# Patient Record
Sex: Female | Born: 1935
Health system: Southern US, Community
[De-identification: ages and names within clinical notes are randomized; demographics above are authoritative.]

## PROBLEM LIST (undated history)

## (undated) DIAGNOSIS — F32A Depression, unspecified: Secondary | ICD-10-CM

## (undated) DIAGNOSIS — D649 Anemia, unspecified: Secondary | ICD-10-CM

## (undated) DIAGNOSIS — M1990 Unspecified osteoarthritis, unspecified site: Secondary | ICD-10-CM

## (undated) DIAGNOSIS — R112 Nausea with vomiting, unspecified: Secondary | ICD-10-CM

## (undated) DIAGNOSIS — C180 Malignant neoplasm of cecum: Secondary | ICD-10-CM

## (undated) DIAGNOSIS — T7840XA Allergy, unspecified, initial encounter: Secondary | ICD-10-CM

## (undated) DIAGNOSIS — I10 Essential (primary) hypertension: Secondary | ICD-10-CM

## (undated) DIAGNOSIS — J309 Allergic rhinitis, unspecified: Secondary | ICD-10-CM

## (undated) DIAGNOSIS — Z9889 Other specified postprocedural states: Secondary | ICD-10-CM

## (undated) DIAGNOSIS — Z85828 Personal history of other malignant neoplasm of skin: Secondary | ICD-10-CM

## (undated) DIAGNOSIS — F419 Anxiety disorder, unspecified: Secondary | ICD-10-CM

## (undated) DIAGNOSIS — M810 Age-related osteoporosis without current pathological fracture: Secondary | ICD-10-CM

## (undated) DIAGNOSIS — R7302 Impaired glucose tolerance (oral): Secondary | ICD-10-CM

## (undated) DIAGNOSIS — F329 Major depressive disorder, single episode, unspecified: Secondary | ICD-10-CM

## (undated) HISTORY — DX: Anxiety disorder, unspecified: F41.9

## (undated) HISTORY — PX: ABDOMINAL HYSTERECTOMY: SHX81

## (undated) HISTORY — PX: APPENDECTOMY: SHX54

## (undated) HISTORY — DX: Anemia, unspecified: D64.9

## (undated) HISTORY — DX: Major depressive disorder, single episode, unspecified: F32.9

## (undated) HISTORY — DX: Unspecified osteoarthritis, unspecified site: M19.90

## (undated) HISTORY — DX: Depression, unspecified: F32.A

## (undated) HISTORY — DX: Impaired glucose tolerance (oral): R73.02

## (undated) HISTORY — DX: Essential (primary) hypertension: I10

## (undated) HISTORY — DX: Allergic rhinitis, unspecified: J30.9

## (undated) HISTORY — DX: Allergy, unspecified, initial encounter: T78.40XA

## (undated) HISTORY — PX: CHOLECYSTECTOMY: SHX55

## (undated) HISTORY — PX: TONSILLECTOMY: SUR1361

---

## 1999-05-29 ENCOUNTER — Other Ambulatory Visit: Admission: RE | Admit: 1999-05-29 | Discharge: 1999-05-29 | Payer: Self-pay | Admitting: Obstetrics and Gynecology

## 2000-05-29 ENCOUNTER — Other Ambulatory Visit: Admission: RE | Admit: 2000-05-29 | Discharge: 2000-05-29 | Payer: Self-pay | Admitting: Obstetrics and Gynecology

## 2001-07-28 ENCOUNTER — Other Ambulatory Visit: Admission: RE | Admit: 2001-07-28 | Discharge: 2001-07-28 | Payer: Self-pay | Admitting: Obstetrics and Gynecology

## 2002-05-26 ENCOUNTER — Emergency Department (HOSPITAL_COMMUNITY): Admission: EM | Admit: 2002-05-26 | Discharge: 2002-05-26 | Payer: Self-pay

## 2002-08-02 ENCOUNTER — Other Ambulatory Visit: Admission: RE | Admit: 2002-08-02 | Discharge: 2002-08-02 | Payer: Self-pay | Admitting: Obstetrics and Gynecology

## 2004-02-01 ENCOUNTER — Other Ambulatory Visit: Admission: RE | Admit: 2004-02-01 | Discharge: 2004-02-01 | Payer: Self-pay | Admitting: Obstetrics and Gynecology

## 2008-12-09 HISTORY — PX: BACK SURGERY: SHX140

## 2009-11-27 ENCOUNTER — Encounter (INDEPENDENT_AMBULATORY_CARE_PROVIDER_SITE_OTHER): Payer: Self-pay | Admitting: Orthopaedic Surgery

## 2009-11-27 ENCOUNTER — Ambulatory Visit (HOSPITAL_COMMUNITY): Admission: RE | Admit: 2009-11-27 | Discharge: 2009-11-28 | Payer: Self-pay | Admitting: Orthopaedic Surgery

## 2011-03-11 LAB — URINALYSIS, ROUTINE W REFLEX MICROSCOPIC
Bilirubin Urine: NEGATIVE
Glucose, UA: NEGATIVE mg/dL
Ketones, ur: NEGATIVE mg/dL
Nitrite: NEGATIVE
Protein, ur: NEGATIVE mg/dL
Specific Gravity, Urine: 1.02 (ref 1.005–1.030)
Urobilinogen, UA: 0.2 mg/dL (ref 0.0–1.0)
pH: 5.5 (ref 5.0–8.0)

## 2011-03-11 LAB — COMPREHENSIVE METABOLIC PANEL
ALT: 21 U/L (ref 0–35)
AST: 22 U/L (ref 0–37)
Albumin: 4 g/dL (ref 3.5–5.2)
Alkaline Phosphatase: 84 U/L (ref 39–117)
BUN: 17 mg/dL (ref 6–23)
CO2: 26 mEq/L (ref 19–32)
Calcium: 9.4 mg/dL (ref 8.4–10.5)
Chloride: 104 mEq/L (ref 96–112)
Creatinine, Ser: 0.6 mg/dL (ref 0.4–1.2)
GFR calc Af Amer: >60 mL/min (ref >60)
GFR calc non Af Amer: >60 mL/min (ref >60)
Glucose, Bld: 121 mg/dL — ABNORMAL HIGH (ref 70–99)
Potassium: 3.5 mEq/L (ref 3.5–5.1)
Sodium: 139 mEq/L (ref 135–145)
Total Bilirubin: 0.8 mg/dL (ref 0.3–1.2)
Total Protein: 7.5 g/dL (ref 6.0–8.3)

## 2011-03-11 LAB — URINE MICROSCOPIC-ADD ON

## 2011-03-11 LAB — CBC
HCT: 39.3 % (ref 36.0–46.0)
Hemoglobin: 13.5 g/dL (ref 12.0–15.0)
MCHC: 34.4 g/dL (ref 30.0–36.0)
MCV: 89.3 fL (ref 78.0–100.0)
Platelets: 188 10*3/uL (ref 150–400)
RBC: 4.4 MIL/uL (ref 3.87–5.11)
RDW: 13.6 % (ref 11.5–15.5)
WBC: 6.6 10*3/uL (ref 4.0–10.5)

## 2012-01-26 ENCOUNTER — Encounter: Payer: Self-pay | Admitting: Internal Medicine

## 2012-01-26 DIAGNOSIS — Z0001 Encounter for general adult medical examination with abnormal findings: Secondary | ICD-10-CM | POA: Insufficient documentation

## 2012-01-26 DIAGNOSIS — R7302 Impaired glucose tolerance (oral): Secondary | ICD-10-CM | POA: Insufficient documentation

## 2012-01-26 DIAGNOSIS — M519 Unspecified thoracic, thoracolumbar and lumbosacral intervertebral disc disorder: Secondary | ICD-10-CM | POA: Insufficient documentation

## 2012-01-26 DIAGNOSIS — Z0000 Encounter for general adult medical examination without abnormal findings: Secondary | ICD-10-CM | POA: Insufficient documentation

## 2012-01-28 ENCOUNTER — Encounter: Payer: Self-pay | Admitting: Internal Medicine

## 2012-01-28 ENCOUNTER — Other Ambulatory Visit (INDEPENDENT_AMBULATORY_CARE_PROVIDER_SITE_OTHER): Payer: Medicare Other

## 2012-01-28 ENCOUNTER — Ambulatory Visit (INDEPENDENT_AMBULATORY_CARE_PROVIDER_SITE_OTHER): Payer: Medicare Other | Admitting: Internal Medicine

## 2012-01-28 ENCOUNTER — Ambulatory Visit (INDEPENDENT_AMBULATORY_CARE_PROVIDER_SITE_OTHER)
Admission: RE | Admit: 2012-01-28 | Discharge: 2012-01-28 | Disposition: A | Discharge status: Home / Self Care | Payer: Medicare Other | Source: Ambulatory Visit | Attending: Internal Medicine | Admitting: Internal Medicine

## 2012-01-28 VITALS — BP 162/82 | HR 79 | Temp 97.1°F | Ht 64.0 in | Wt 133.4 lb

## 2012-01-28 DIAGNOSIS — I10 Essential (primary) hypertension: Secondary | ICD-10-CM

## 2012-01-28 DIAGNOSIS — F411 Generalized anxiety disorder: Secondary | ICD-10-CM

## 2012-01-28 DIAGNOSIS — M25512 Pain in left shoulder: Secondary | ICD-10-CM

## 2012-01-28 DIAGNOSIS — M25519 Pain in unspecified shoulder: Secondary | ICD-10-CM

## 2012-01-28 DIAGNOSIS — F329 Major depressive disorder, single episode, unspecified: Secondary | ICD-10-CM | POA: Insufficient documentation

## 2012-01-28 DIAGNOSIS — Z23 Encounter for immunization: Secondary | ICD-10-CM

## 2012-01-28 DIAGNOSIS — M1990 Unspecified osteoarthritis, unspecified site: Secondary | ICD-10-CM

## 2012-01-28 DIAGNOSIS — Z79899 Other long term (current) drug therapy: Secondary | ICD-10-CM

## 2012-01-28 DIAGNOSIS — Z0000 Encounter for general adult medical examination without abnormal findings: Secondary | ICD-10-CM

## 2012-01-28 DIAGNOSIS — F419 Anxiety disorder, unspecified: Secondary | ICD-10-CM

## 2012-01-28 DIAGNOSIS — J309 Allergic rhinitis, unspecified: Secondary | ICD-10-CM

## 2012-01-28 HISTORY — DX: Anxiety disorder, unspecified: F41.9

## 2012-01-28 HISTORY — DX: Unspecified osteoarthritis, unspecified site: M19.90

## 2012-01-28 HISTORY — DX: Allergic rhinitis, unspecified: J30.9

## 2012-01-28 LAB — TSH: TSH: 1.37 u[IU]/mL (ref 0.35–5.50)

## 2012-01-28 LAB — CBC WITH DIFFERENTIAL/PLATELET
Basophils Absolute: 0.1 10*3/uL (ref 0.0–0.1)
Eosinophils Absolute: 0.2 10*3/uL (ref 0.0–0.7)
Lymphocytes Relative: 33.2 % (ref 12.0–46.0)
MCHC: 33.5 g/dL (ref 30.0–36.0)
MCV: 89 fl (ref 78.0–100.0)
Monocytes Absolute: 0.3 10*3/uL (ref 0.1–1.0)
Neutrophils Relative %: 57.6 % (ref 43.0–77.0)
Platelets: 217 10*3/uL (ref 150.0–400.0)
RBC: 4.59 Mil/uL (ref 3.87–5.11)

## 2012-01-28 LAB — LIPID PANEL
Cholesterol: 252 mg/dL — ABNORMAL HIGH (ref 0–200)
Total CHOL/HDL Ratio: 4
VLDL: 22.2 mg/dL (ref 0.0–40.0)

## 2012-01-28 LAB — HEPATIC FUNCTION PANEL
ALT: 18 U/L (ref 0–35)
AST: 21 U/L (ref 0–37)
Bilirubin, Direct: 0 mg/dL (ref 0.0–0.3)
Total Bilirubin: 0.4 mg/dL (ref 0.3–1.2)
Total Protein: 7.4 g/dL (ref 6.0–8.3)

## 2012-01-28 LAB — URINALYSIS, ROUTINE W REFLEX MICROSCOPIC
Bilirubin Urine: NEGATIVE
Nitrite: NEGATIVE
Urobilinogen, UA: 0.2 (ref 0.0–1.0)

## 2012-01-28 LAB — BASIC METABOLIC PANEL
BUN: 15 mg/dL (ref 6–23)
CO2: 29 mEq/L (ref 19–32)
Chloride: 101 mEq/L (ref 96–112)
Creatinine, Ser: 0.6 mg/dL (ref 0.4–1.2)
Potassium: 4.5 mEq/L (ref 3.5–5.1)

## 2012-01-28 LAB — LDL CHOLESTEROL, DIRECT: Direct LDL: 166.8 mg/dL

## 2012-01-28 MED ORDER — LOSARTAN POTASSIUM 100 MG PO TABS: 100.0000 mg | ORAL_TABLET | Freq: Every day | ORAL | Status: DC

## 2012-01-28 MED ORDER — DIAZEPAM 5 MG PO TABS: 5.0000 mg | ORAL_TABLET | Freq: Two times a day (BID) | ORAL | Status: AC | PRN

## 2012-01-28 NOTE — Assessment & Plan Note (Signed)
Overall doing well, age appropriate education and counseling updated, referrals for preventative services and immunizations addressed, dietary and smoking counseling addressed, most recent labs and ECG reviewed.  I have personally reviewed and have noted: 1) the patient's medical and social history 2) The pt's use of alcohol, tobacco, and illicit drugs 3) The patient's current medications and supplements 4) Functional ability including ADL's, fall risk, home safety risk, hearing and visual impairment 5) Diet and physical activities 6) Evidence for depression or mood disorder 7) The patient's height, weight, and BMI have been recorded in the chart I have made referrals, and provided counseling and education based on review of the above Declines colonscopy adn immunizations except ok for pneumonvax today, ECG reviewed as per emr

## 2012-01-28 NOTE — Assessment & Plan Note (Signed)
Severe uncontrolled, to change to losartan 100 mg per day; ECG reviewed as per emr, to f/u 1 month

## 2012-01-28 NOTE — Progress Notes (Signed)
Subjective:    Patient ID: Carrie Page, female    DOB: 1928/09/14, 78 y.o.   MRN: [id number]  HPI  Here for wellness and to establish as new pt;  Overall doing ok;  Pt denies CP, worsening SOB, DOE, wheezing, orthopnea, PND, worsening LE edema, palpitations, dizziness or syncope.  Pt denies neurological change such as new Headache, facial or extremity weakness.  Pt denies polydipsia, polyuria, or low sugar symptoms. Pt states overall good compliance with treatment and medications, good tolerability, and trying to follow lower cholesterol diet.  Pt denies worsening depressive symptoms, suicidal ideation or panic. No fever, wt loss, night sweats, loss of appetite, or other constitutional symptoms.  Pt states good ability with ADL's, low fall risk, home safety reviewed and adequate, no significant changes in hearing or vision, and occasionally active with exercise.  Has had some worsening anxiety and depressive symtpoms with mother, sister, and father deaths relatviely recent. Asks for for valium, to use only infrequently as she has had in the past.  Also with c/o left shoulder pain for several months, has FROM, no swelling, rash or recent trauma or falls, but aches to use in general. Past Medical History  Diagnosis Date  . Impaired glucose tolerance   . Lumbar disc disease 01/26/2012  . Arthritis   . Allergy   . Allergic rhinitis, cause unspecified 01/28/2012  . Degenerative joint disease 01/28/2012  . Hypertension   . Depression   . Anxiety 01/28/2012   Past Surgical History  Procedure Date  . Lumbar foraminotomy and microdiskectomy dec 2010    Dr Carolyne Littles  . Abdominal hysterectomy   . Appendectomy   . Gallbladder surgery   . Tonsillectomy   . Back surgery 2010    reports that she has never smoked. She does not have any smokeless tobacco history on file. She reports that she does not drink alcohol or use illicit drugs. family history includes Arthritis in her other; Heart disease in  her other; and Stroke in her other. No Known Allergies No current outpatient prescriptions on file prior to visit.   Review of Systems Review of Systems  Constitutional: Negative for diaphoresis, activity change, appetite change and unexpected weight change.  HENT: Negative for hearing loss, ear pain, facial swelling, mouth sores and neck stiffness.   Eyes: Negative for pain, redness and visual disturbance.  Respiratory: Negative for shortness of breath and wheezing.   Cardiovascular: Negative for chest pain and palpitations.  Gastrointestinal: Negative for diarrhea, blood in stool, abdominal distention and rectal pain.  Genitourinary: Negative for hematuria, flank pain and decreased urine volume.  Musculoskeletal: Negative for myalgias and joint swelling.  Skin: Negative for color change and wound.  Neurological: Negative for syncope and numbness.  Hematological: Negative for adenopathy.  Psychiatric/Behavioral: Negative for hallucinations, self-injury, decreased concentration and agitation.  \    Objective:   Physical Exam BP 162/82  Pulse 79  Temp(Src) 97.1 F (36.2 C) (Oral)  Ht 5\' 4"  (1.626 m)  Wt 133 lb 6 oz (60.499 kg)  BMI 22.89 kg/m2  SpO2 97% Physical Exam  VS noted Constitutional: Pt is oriented to person, place, and time. Appears well-developed and well-nourished.  HENT:  Head: Normocephalic and atraumatic.  Right Ear: External ear normal.  Left Ear: External ear normal.  Nose: Nose normal.  Mouth/Throat: Oropharynx is clear and moist.  Eyes: Conjunctivae and EOM are normal. Pupils are equal, round, and reactive to light.  Neck: Normal range of motion. Neck supple. No JVD  present. No tracheal deviation present.  Cardiovascular: Normal rate, regular rhythm, normal heart sounds and intact distal pulses.   Pulmonary/Chest: Effort normal and breath sounds normal.  Abdominal: Soft. Bowel sounds are normal. There is no tenderness.  Musculoskeletal: Normal range of  motion. Exhibits no edema.  Lymphadenopathy:  Has no cervical adenopathy.  Neurological: Pt is alert and oriented to person, place, and time. Pt has normal reflexes. No cranial nerve deficit.  Skin: Skin is warm and dry. No rash noted.  Psychiatric:  Has  normal mood and affect. Behavior is normal. 1+ nervous Left shoulder NT, FROM    Assessment & Plan:

## 2012-01-28 NOTE — Patient Instructions (Addendum)
OK to stop the lisinopril 5 mg per day Start the losartan (cozaar) at 100 mg per day, as well as the valium as needed You had the pneumonia shot today Your EKG was ok today Please go to XRAY in the Basement for the x-ray test Please go to LAB in the Basement for the blood and/or urine tests to be done today Please call the phone number [phone] (the [employer] System) for results of testing in 2-3 days;  When calling, simply dial the number, and when prompted enter the MRN number above (the Medical Record Number) and the # key, then the message should start. Please let me know if you would like to see orthopedic about your shoulders Please return in 1 month, since you are starting new medication today

## 2012-01-29 ENCOUNTER — Other Ambulatory Visit: Payer: Self-pay | Admitting: Internal Medicine

## 2012-01-29 DIAGNOSIS — E785 Hyperlipidemia, unspecified: Secondary | ICD-10-CM

## 2012-01-29 DIAGNOSIS — Z79899 Other long term (current) drug therapy: Secondary | ICD-10-CM

## 2012-01-29 MED ORDER — PRAVASTATIN SODIUM 20 MG PO TABS: 20.0000 mg | ORAL_TABLET | Freq: Every day | ORAL | Status: DC

## 2012-02-02 ENCOUNTER — Encounter: Payer: Self-pay | Admitting: Internal Medicine

## 2012-02-02 NOTE — Assessment & Plan Note (Signed)
?   Rot cuff vs DJD - for film today per pt reqeust

## 2012-02-02 NOTE — Assessment & Plan Note (Signed)
Mild ongoing, for valium prn,  to f/u any worsening symptoms or concerns

## 2012-02-27 ENCOUNTER — Encounter: Payer: Self-pay | Admitting: Internal Medicine

## 2012-02-27 ENCOUNTER — Other Ambulatory Visit: Payer: Self-pay | Admitting: Internal Medicine

## 2012-02-27 ENCOUNTER — Other Ambulatory Visit (INDEPENDENT_AMBULATORY_CARE_PROVIDER_SITE_OTHER): Payer: Medicare Other

## 2012-02-27 ENCOUNTER — Ambulatory Visit (INDEPENDENT_AMBULATORY_CARE_PROVIDER_SITE_OTHER): Payer: Medicare Other | Admitting: Internal Medicine

## 2012-02-27 VITALS — BP 152/84 | HR 78 | Temp 97.2°F | Ht 65.0 in | Wt 131.8 lb

## 2012-02-27 DIAGNOSIS — E785 Hyperlipidemia, unspecified: Secondary | ICD-10-CM

## 2012-02-27 DIAGNOSIS — F419 Anxiety disorder, unspecified: Secondary | ICD-10-CM

## 2012-02-27 DIAGNOSIS — M25512 Pain in left shoulder: Secondary | ICD-10-CM

## 2012-02-27 DIAGNOSIS — M25519 Pain in unspecified shoulder: Secondary | ICD-10-CM

## 2012-02-27 DIAGNOSIS — Z79899 Other long term (current) drug therapy: Secondary | ICD-10-CM | POA: Insufficient documentation

## 2012-02-27 DIAGNOSIS — F411 Generalized anxiety disorder: Secondary | ICD-10-CM

## 2012-02-27 DIAGNOSIS — I10 Essential (primary) hypertension: Secondary | ICD-10-CM

## 2012-02-27 LAB — HEPATIC FUNCTION PANEL
Bilirubin, Direct: 0.1 mg/dL (ref 0.0–0.3)
Total Bilirubin: 0.5 mg/dL (ref 0.3–1.2)
Total Protein: 7.7 g/dL (ref 6.0–8.3)

## 2012-02-27 LAB — LIPID PANEL
HDL: 70 mg/dL (ref >39.00)
Total CHOL/HDL Ratio: 3
VLDL: 31 mg/dL (ref 0.0–40.0)

## 2012-02-27 MED ORDER — CLONAZEPAM 0.5 MG PO TABS: 0.5000 mg | ORAL_TABLET | Freq: Two times a day (BID) | ORAL | Status: AC | PRN

## 2012-02-27 MED ORDER — PRAVASTATIN SODIUM 40 MG PO TABS: 40.0000 mg | ORAL_TABLET | Freq: Every day | ORAL | Status: DC

## 2012-02-27 NOTE — Patient Instructions (Signed)
Take all new medications as prescribed - the clonazepam for nerves (after the valium is done) Please go to LAB in the Basement for the blood and/or urine tests to be done today You will be contacted by phone if any changes need to be made immediately.  Otherwise, you will receive a letter about your results with an explanation. Please check your Blood Pressure on a regular basis, your goal is to be less than 140/90 Please follow lower cholesterol diet if possible.

## 2012-02-27 NOTE — Progress Notes (Signed)
  Subjective:    Patient ID: Carrie Page, female    DOB: January 06, 1928, 78 y.o.   MRN: [id number]  HPI Here to f/u; overall doing ok;  Left shoulder pain no worse, may be somewhat improved, not funcitonally limiting more than prior and thinks "i can live with it,"  Now on new statin - pravachol 20 mg and tolerating well without confusion, constipation, myalgia, joint pain.  Received letter per insurance who will no longer pay for valium.  BP at home has been < 140/90 per pt, tends to go up with coming to the office.  Denies worsening depressive symptoms, suicidal ideation, or panic, though has ongoing anxiety.  Pt denies chest pain, increased sob or doe, wheezing, orthopnea, PND, increased LE swelling, palpitations, dizziness or syncope.  Pt denies new neurological symptoms such as new headache, or facial or extremity weakness or numbness   Pt denies polydipsia, polyuria.   Past Medical History  Diagnosis Date  . Impaired glucose tolerance   . Lumbar disc disease 01/26/2012  . Arthritis   . Allergy   . Allergic rhinitis, cause unspecified 01/28/2012  . Degenerative joint disease 01/28/2012  . Hypertension   . Depression   . Anxiety 01/28/2012   Past Surgical History  Procedure Date  . Lumbar foraminotomy and microdiskectomy dec 2010    Dr Carolyne Littles  . Abdominal hysterectomy   . Appendectomy   . Gallbladder surgery   . Tonsillectomy   . Back surgery 2010    reports that she has never smoked. She does not have any smokeless tobacco history on file. She reports that she does not drink alcohol or use illicit drugs. family history includes Arthritis in her other; Heart disease in her other; and Stroke in her other. No Known Allergies Current Outpatient Prescriptions on File Prior to Visit  Medication Sig Dispense Refill  . losartan (COZAAR) 100 MG tablet Take 1 tablet (100 mg total) by mouth daily.  90 tablet  3  . pravastatin (PRAVACHOL) 20 MG tablet Take 1 tablet (20 mg total) by mouth  daily.  90 tablet  3   Review of Systems Review of Systems  Constitutional: Negative for diaphoresis and unexpected weight change.  HENT: Negative for drooling and tinnitus.   Eyes: Negative for photophobia and visual disturbance.  Respiratory: Negative for choking and stridor.   Gastrointestinal: Negative for vomiting and blood in stool.  Genitourinary: Negative for hematuria and decreased urine volume.     Objective:   Physical Exam BP 152/84  Pulse 78  Temp(Src) 97.2 F (36.2 C) (Oral)  Ht 5\' 5"  (1.651 m)  Wt 131 lb 12 oz (59.761 kg)  BMI 21.92 kg/m2  SpO2 98% Physical Exam  VS noted Constitutional: Pt appears well-developed and well-nourished.  HENT: Head: Normocephalic.  Right Ear: External ear normal.  Left Ear: External ear normal.  Eyes: Conjunctivae and EOM are normal. Pupils are equal, round, and reactive to light.  Neck: Normal range of motion. Neck supple.  Cardiovascular: Normal rate and regular rhythm.   Pulmonary/Chest: Effort normal and breath sounds normal.  Left shoulder decr ROM to 110 degrees to abduct with some discomfort, NT, no swelling Neurological: Pt is alert. No cranial nerve deficit.  Skin: Skin is warm. No erythema.  Psychiatric: Pt behavior is normal. Thought content normal. 1+ nervous    Assessment & Plan:

## 2012-02-27 NOTE — Assessment & Plan Note (Signed)
Mild elev today, ? White coat situaional, pt declines change in tx today  BP Readings from Last 3 Encounters:  02/27/12 152/84  01/28/12 162/82

## 2012-02-27 NOTE — Assessment & Plan Note (Signed)
Also for lft's on the new statin,  to f/u any worsening symptoms or concerns

## 2012-02-27 NOTE — Assessment & Plan Note (Addendum)
Tolerating the pravcholl 20 mg, for lipids today, goal < 100 - consider incr pravachol vs change to zocor (pt declines lipitor)  Last ldl 166 (last months visit)

## 2012-02-27 NOTE — Assessment & Plan Note (Signed)
stable overall by hx and exam, most recent data reviewed with pt- xray from last visit, and pt to continue medical treatment as before, consider ortho if worse

## 2012-02-27 NOTE — Assessment & Plan Note (Signed)
Ins will not pay further for valium; for klonopin prn,  to f/u any worsening symptoms or concerns

## 2012-06-01 ENCOUNTER — Telehealth: Payer: Self-pay

## 2012-06-01 NOTE — Telephone Encounter (Signed)
If no fever, ok to see in OV tomorrow am

## 2012-06-01 NOTE — Telephone Encounter (Signed)
Patient is having burning when urinating, please advise OV or go to lab to check uriine. Please advise as the patient stated she has never had UTI in the past.

## 2012-06-01 NOTE — Telephone Encounter (Signed)
Patient informed. 

## 2012-06-02 ENCOUNTER — Telehealth: Payer: Self-pay | Admitting: Internal Medicine

## 2012-06-02 ENCOUNTER — Ambulatory Visit (INDEPENDENT_AMBULATORY_CARE_PROVIDER_SITE_OTHER): Payer: Medicare Other | Admitting: Internal Medicine

## 2012-06-02 ENCOUNTER — Other Ambulatory Visit: Payer: Medicare Other

## 2012-06-02 ENCOUNTER — Encounter: Payer: Self-pay | Admitting: Internal Medicine

## 2012-06-02 VITALS — BP 154/82 | HR 70 | Temp 97.1°F | Ht 64.0 in | Wt 133.4 lb

## 2012-06-02 DIAGNOSIS — I10 Essential (primary) hypertension: Secondary | ICD-10-CM

## 2012-06-02 DIAGNOSIS — F411 Generalized anxiety disorder: Secondary | ICD-10-CM

## 2012-06-02 DIAGNOSIS — F419 Anxiety disorder, unspecified: Secondary | ICD-10-CM

## 2012-06-02 DIAGNOSIS — R300 Dysuria: Secondary | ICD-10-CM

## 2012-06-02 LAB — POCT URINALYSIS DIPSTICK
Glucose, UA: NEGATIVE
Ketones, UA: NEGATIVE
Protein, UA: NEGATIVE
Urobilinogen, UA: 0.2

## 2012-06-02 MED ORDER — CEPHALEXIN 500 MG PO CAPS: 500.0000 mg | ORAL_CAPSULE | Freq: Four times a day (QID) | ORAL | Status: DC

## 2012-06-02 MED ORDER — NITROFURANTOIN MONOHYD MACRO 100 MG PO CAPS: 100.0000 mg | ORAL_CAPSULE | Freq: Two times a day (BID) | ORAL | Status: AC

## 2012-06-02 NOTE — Telephone Encounter (Signed)
Patient informed pcn added and sent in new prescription.

## 2012-06-02 NOTE — Telephone Encounter (Signed)
Message copied by Corwin Levins on Tue Jun 02, 2012  4:44 PM ------      Message from: Pincus Sanes      Created: Tue Jun 02, 2012  4:27 PM       Patient called she cannot take the antibiotic sent in to pharmacy. She stated she is allergic to penicillin. Also does not want cipro as a friend had a reaction to that med.

## 2012-06-02 NOTE — Patient Instructions (Addendum)
Take all new medications as prescribed - the antibiotic Your urine specimen will be sent for culture, and you will be notified if the antibiotic needs to be changed Continue all other medications as before

## 2012-06-02 NOTE — Telephone Encounter (Signed)
pcn added to allergy list  Ok for nitrofurantoin asd - done erx

## 2012-06-05 ENCOUNTER — Encounter: Payer: Self-pay | Admitting: Internal Medicine

## 2012-06-05 LAB — URINE CULTURE: Colony Count: >=100000

## 2012-06-07 ENCOUNTER — Encounter: Payer: Self-pay | Admitting: Internal Medicine

## 2012-06-07 NOTE — Progress Notes (Signed)
  Subjective:    Patient ID: Carrie Page, female    DOB: 1928/07/27, 78 y.o.   MRN: [id number]  HPI Here with acute onset 3 days dysuria with frequency and nausea, but no high fever, urgency, hematuria, back or flank pain, abd pain, vomiting, chills.  Pt denies chest pain, increased sob or doe, wheezing, orthopnea, PND, increased LE swelling, palpitations, dizziness or syncope.   Pt denies polydipsia, polyuria. Pt denies new neurological symptoms such as new headache, or facial or extremity weakness or numbness  Denies worsening depressive symptoms, suicidal ideation, or panic, though has ongoing anxiety, adn klonopin working better tha prior benzo med.   Past Medical History  Diagnosis Date  . Impaired glucose tolerance   . Lumbar disc disease 01/26/2012  . Arthritis   . Allergy   . Allergic rhinitis, cause unspecified 01/28/2012  . Degenerative joint disease 01/28/2012  . Hypertension   . Depression   . Anxiety 01/28/2012   Past Surgical History  Procedure Date  . Lumbar foraminotomy and microdiskectomy dec 2010    Dr Carolyne Littles  . Abdominal hysterectomy   . Appendectomy   . Gallbladder surgery   . Tonsillectomy   . Back surgery 2010    reports that she has never smoked. She does not have any smokeless tobacco history on file. She reports that she does not drink alcohol or use illicit drugs. family history includes Arthritis in her other; Heart disease in her other; and Stroke in her other. Allergies  Allergen Reactions  . Penicillins    Current Outpatient Prescriptions on File Prior to Visit  Medication Sig Dispense Refill  . losartan (COZAAR) 100 MG tablet Take 1 tablet (100 mg total) by mouth daily.  90 tablet  3  . pravastatin (PRAVACHOL) 40 MG tablet Take 1 tablet (40 mg total) by mouth daily.  90 tablet  3   Review of Systems Review of Systems  Constitutional: Negative for diaphoresis and unexpected weight change.  HENT: Negative for tinnitus.   Eyes: Negative for  photophobia and visual disturbance.  Respiratory: Negative for choking and stridor.   Gastrointestinal: Negative for vomiting and blood in stool.  Genitourinary: Negative for hematuria and decreased urine volume.  Musculoskeletal: Negative for gait problem.  Skin: Negative for color change and wound.  Objective:   Physical Exam BP 154/82  Pulse 70  Temp 97.1 F (36.2 C) (Oral)  Ht 5\' 4"  (1.626 m)  Wt 133 lb 6 oz (60.499 kg)  BMI 22.89 kg/m2  SpO2 97% Physical Exam  VS noted, mild fatigued appearing Constitutional: Pt appears well-developed and well-nourished.  HENT: Head: Normocephalic.  Right Ear: External ear normal.  Left Ear: External ear normal.  Eyes: Conjunctivae and EOM are normal. Pupils are equal, round, and reactive to light.  Neck: Normal range of motion. Neck supple.  Cardiovascular: Normal rate and regular rhythm.   Pulmonary/Chest: Effort normal and breath sounds normal.  Abd:  Soft, NT, non-distended, + BS, no flank tender Neurological: Pt is alert. Not confused Skin: Skin is warm. No erythema.  Psychiatric: Pt behavior is normal. Thought content normal.     Assessment & Plan:

## 2012-06-07 NOTE — Assessment & Plan Note (Signed)
stable overall by hx and exam, most recent data reviewed with pt, and pt to continue medical treatment as before Lab Results  Component Value Date   WBC 6.7 01/28/2012   HGB 13.7 01/28/2012   HCT 40.9 01/28/2012   PLT 217.0 01/28/2012   GLUCOSE 108* 01/28/2012   CHOL 209* 02/27/2012   TRIG 155.0* 02/27/2012   HDL 70.00 02/27/2012   LDLDIRECT 126.9 02/27/2012   ALT 20 02/27/2012   AST 21 02/27/2012   NA 138 01/28/2012   K 4.5 01/28/2012   CL 101 01/28/2012   CREATININE 0.6 01/28/2012   BUN 15 01/28/2012   CO2 29 01/28/2012   TSH 1.37 01/28/2012

## 2012-06-07 NOTE — Assessment & Plan Note (Signed)
Mild to mod, for antibx course,  to f/u any worsening symptoms or concerns 

## 2012-06-07 NOTE — Assessment & Plan Note (Signed)
stable overall by hx and exam - still mild elevated but declines further tx at this time, most recent data reviewed with pt, and pt to continue medical treatment as before BP Readings from Last 3 Encounters:  06/02/12 154/82  02/27/12 152/84  01/28/12 162/82

## 2012-06-22 ENCOUNTER — Telehealth: Payer: Self-pay

## 2012-06-22 MED ORDER — NITROFURANTOIN MONOHYD MACRO 100 MG PO CAPS: 100.0000 mg | ORAL_CAPSULE | Freq: Two times a day (BID) | ORAL | Status: AC

## 2012-06-22 NOTE — Telephone Encounter (Signed)
Patient informed left detailed message 

## 2012-06-22 NOTE — Telephone Encounter (Signed)
Pt called requesting a refill of ABX. Pt is c/o continued mild dysuria and frequency. Please advise.

## 2012-06-22 NOTE — Telephone Encounter (Signed)
Ok for nitrofurantoin rx this time - done erx

## 2012-09-03 ENCOUNTER — Encounter: Payer: Self-pay | Admitting: Internal Medicine

## 2012-09-03 ENCOUNTER — Ambulatory Visit (INDEPENDENT_AMBULATORY_CARE_PROVIDER_SITE_OTHER): Payer: Medicare Other | Admitting: Internal Medicine

## 2012-09-03 ENCOUNTER — Other Ambulatory Visit (INDEPENDENT_AMBULATORY_CARE_PROVIDER_SITE_OTHER): Payer: Medicare Other

## 2012-09-03 VITALS — BP 142/78 | HR 81 | Temp 97.4°F | Ht 64.0 in | Wt 129.5 lb

## 2012-09-03 DIAGNOSIS — E785 Hyperlipidemia, unspecified: Secondary | ICD-10-CM

## 2012-09-03 DIAGNOSIS — Z0000 Encounter for general adult medical examination without abnormal findings: Secondary | ICD-10-CM

## 2012-09-03 DIAGNOSIS — I10 Essential (primary) hypertension: Secondary | ICD-10-CM

## 2012-09-03 DIAGNOSIS — R7302 Impaired glucose tolerance (oral): Secondary | ICD-10-CM

## 2012-09-03 DIAGNOSIS — R7309 Other abnormal glucose: Secondary | ICD-10-CM

## 2012-09-03 DIAGNOSIS — Z23 Encounter for immunization: Secondary | ICD-10-CM

## 2012-09-03 LAB — BASIC METABOLIC PANEL
CO2: 29 mEq/L (ref 19–32)
Calcium: 9.6 mg/dL (ref 8.4–10.5)
Chloride: 100 mEq/L (ref 96–112)
Sodium: 137 mEq/L (ref 135–145)

## 2012-09-03 LAB — HEPATIC FUNCTION PANEL
Alkaline Phosphatase: 78 U/L (ref 39–117)
Bilirubin, Direct: 0.1 mg/dL (ref 0.0–0.3)
Total Protein: 7.7 g/dL (ref 6.0–8.3)

## 2012-09-03 LAB — URINALYSIS, ROUTINE W REFLEX MICROSCOPIC
Bilirubin Urine: NEGATIVE
Ketones, ur: NEGATIVE
Urine Glucose: NEGATIVE
Urobilinogen, UA: 0.2 (ref 0.0–1.0)

## 2012-09-03 LAB — LIPID PANEL
Total CHOL/HDL Ratio: 3
Triglycerides: 141 mg/dL (ref 0.0–149.0)

## 2012-09-03 LAB — CBC WITH DIFFERENTIAL/PLATELET
Basophils Absolute: 0.1 10*3/uL (ref 0.0–0.1)
Eosinophils Absolute: 0.2 10*3/uL (ref 0.0–0.7)
Hemoglobin: 13.5 g/dL (ref 12.0–15.0)
Lymphocytes Relative: 39.4 % (ref 12.0–46.0)
Lymphs Abs: 2.4 10*3/uL (ref 0.7–4.0)
MCHC: 32.9 g/dL (ref 30.0–36.0)
Neutro Abs: 3.1 10*3/uL (ref 1.4–7.7)
RDW: 13.9 % (ref 11.5–14.6)

## 2012-09-03 LAB — LDL CHOLESTEROL, DIRECT: Direct LDL: 117.1 mg/dL

## 2012-09-03 MED ORDER — IRBESARTAN 300 MG PO TABS: 300.0000 mg | ORAL_TABLET | Freq: Every day | ORAL | Status: DC

## 2012-09-03 NOTE — Assessment & Plan Note (Signed)

## 2012-09-03 NOTE — Patient Instructions (Addendum)
You had the flu shot today OK to finish your current bottle of losartan (cozaar) Then change to the Avapro (irbesartan) - 300 mg per day Continue all other medications as before Please have the pharmacy call with any refills you may need. Please continue your efforts at being more active, low cholesterol diet, and weight control. You are otherwise up to date with prevention Please go to LAB in the Basement for the blood and/or urine tests to be done today You will be contacted by phone if any changes need to be made immediately.  Otherwise, you will receive a letter about your results with an explanation. Please remember to sign up for My Chart at your earliest convenience, as this will be important to you in the future with finding out test results. Please return in 6 mo with Lab testing done 3-5 days before

## 2012-09-03 NOTE — Progress Notes (Signed)
Subjective:    Patient ID: Carrie Page, female    DOB: 09-08-1928, 78 y.o.   MRN: [id number]  HPI  Here for wellness and f/u;  Overall doing ok;  Pt denies CP, worsening SOB, DOE, wheezing, orthopnea, PND, worsening LE edema, palpitations, dizziness or syncope.  Pt denies neurological change such as new Headache, facial or extremity weakness.  Pt denies polydipsia, polyuria, or low sugar symptoms. Pt states overall good compliance with treatment and medications, good tolerability, and trying to follow lower cholesterol diet.  Pt denies worsening depressive symptoms, suicidal ideation or panic. No fever, wt loss, night sweats, loss of appetite, or other constitutional symptoms.  Pt states good ability with ADL's, low fall risk, home safety reviewed and adequate, no significant changes in hearing or vision, and occasionally active with exercise.  Tolerating the increased pravastatin well.  No acute complaints. Due for flu shot Past Medical History  Diagnosis Date  . Impaired glucose tolerance   . Lumbar disc disease 01/26/2012  . Arthritis   . Allergy   . Allergic rhinitis, cause unspecified 01/28/2012  . Degenerative joint disease 01/28/2012  . Hypertension   . Depression   . Anxiety 01/28/2012   Past Surgical History  Procedure Date  . Lumbar foraminotomy and microdiskectomy dec 2010    Dr Carolyne Littles  . Abdominal hysterectomy   . Appendectomy   . Gallbladder surgery   . Tonsillectomy   . Back surgery 2010    reports that she has never smoked. She does not have any smokeless tobacco history on file. She reports that she does not drink alcohol or use illicit drugs. family history includes Arthritis in her other; Heart disease in her other; and Stroke in her other. Allergies  Allergen Reactions  . Nitrofurantoin Other (See Comments)    GI upset  . Penicillins    Current Outpatient Prescriptions on File Prior to Visit  Medication Sig Dispense Refill  . pravastatin (PRAVACHOL) 40 MG  tablet Take 1 tablet (40 mg total) by mouth daily.  90 tablet  3  . irbesartan (AVAPRO) 300 MG tablet Take 1 tablet (300 mg total) by mouth daily.  30 tablet  11   Review of Systems Review of Systems  Constitutional: Negative for diaphoresis, activity change, appetite change and unexpected weight change.  HENT: Negative for hearing loss, ear pain, facial swelling, mouth sores and neck stiffness.   Eyes: Negative for pain, redness and visual disturbance.  Respiratory: Negative for shortness of breath and wheezing.   Cardiovascular: Negative for chest pain and palpitations.  Gastrointestinal: Negative for diarrhea, blood in stool, abdominal distention and rectal pain.  Genitourinary: Negative for hematuria, flank pain and decreased urine volume.  Musculoskeletal: Negative for myalgias and joint swelling.  Skin: Negative for color change and wound.  Neurological: Negative for syncope and numbness.  Hematological: Negative for adenopathy.  Psychiatric/Behavioral: Negative for hallucinations, self-injury, decreased concentration and agitation.      Objective:   Physical Exam BP 142/78  Pulse 81  Temp 97.4 F (36.3 C) (Oral)  Ht 5\' 4"  (1.626 m)  Wt 129 lb 8 oz (58.741 kg)  BMI 22.23 kg/m2  SpO2 98% Physical Exam  VS noted Constitutional: Pt is oriented to person, place, and time. Appears well-developed and well-nourished.  HENT:  Head: Normocephalic and atraumatic.  Right Ear: External ear normal.  Left Ear: External ear normal.  Nose: Nose normal.  Mouth/Throat: Oropharynx is clear and moist.  Eyes: Conjunctivae and EOM are normal. Pupils are  equal, round, and reactive to light.  Neck: Normal range of motion. Neck supple. No JVD present. No tracheal deviation present.  Cardiovascular: Normal rate, regular rhythm, normal heart sounds and intact distal pulses.   Pulmonary/Chest: Effort normal and breath sounds normal.  Abdominal: Soft. Bowel sounds are normal. There is no  tenderness.  Musculoskeletal: Normal range of motion. Exhibits no edema.  Lymphadenopathy:  Has no cervical adenopathy.  Neurological: Pt is alert and oriented to person, place, and time. Pt has normal reflexes. No cranial nerve deficit.  Skin: Skin is warm and dry. No rash noted.  Psychiatric:  Has  normal mood and affect. Behavior is normal.     Assessment & Plan:

## 2012-09-05 ENCOUNTER — Encounter: Payer: Self-pay | Admitting: Internal Medicine

## 2012-09-05 NOTE — Assessment & Plan Note (Signed)
stable overall by hx and exam, most recent data reviewed with pt, and pt to continue medical treatment as before Lab Results  Component Value Date   HGBA1C 6.1 09/03/2012

## 2012-09-05 NOTE — Assessment & Plan Note (Signed)
Tolerating the pravastatin 40, for f/u labs

## 2012-09-05 NOTE — Assessment & Plan Note (Signed)
Mild uncontrolled, to change to avapro 300

## 2012-11-27 ENCOUNTER — Telehealth: Payer: Self-pay | Admitting: Internal Medicine

## 2012-11-27 MED ORDER — LOVASTATIN 20 MG PO TABS: 20.0000 mg | ORAL_TABLET | Freq: Every day | ORAL | Status: DC

## 2012-11-27 NOTE — Telephone Encounter (Signed)
Pt req phone call from the nurse concern about medication. Please give her a call once you have a chance.

## 2012-11-27 NOTE — Telephone Encounter (Signed)
The patient called to inform since taking pravastatin she has been having muscle aches and dreaming more often.  She did state the pravastatin was only $10 for #90 so please prescribed something at that cost as worked well for her.

## 2012-11-27 NOTE — Telephone Encounter (Signed)
Ok to change to lovastatin 20 qd - done erx

## 2012-11-30 NOTE — Telephone Encounter (Signed)
Pt is aware to call her pharmacy. °

## 2013-03-04 ENCOUNTER — Encounter: Payer: Self-pay | Admitting: Internal Medicine

## 2013-03-04 ENCOUNTER — Ambulatory Visit (INDEPENDENT_AMBULATORY_CARE_PROVIDER_SITE_OTHER): Payer: Medicare Other | Admitting: Internal Medicine

## 2013-03-04 ENCOUNTER — Other Ambulatory Visit: Payer: Self-pay | Admitting: Internal Medicine

## 2013-03-04 ENCOUNTER — Other Ambulatory Visit (INDEPENDENT_AMBULATORY_CARE_PROVIDER_SITE_OTHER): Payer: Medicare Other

## 2013-03-04 VITALS — BP 162/78 | HR 71 | Temp 97.4°F | Ht 64.0 in | Wt 128.2 lb

## 2013-03-04 DIAGNOSIS — E785 Hyperlipidemia, unspecified: Secondary | ICD-10-CM

## 2013-03-04 DIAGNOSIS — Z0000 Encounter for general adult medical examination without abnormal findings: Secondary | ICD-10-CM

## 2013-03-04 DIAGNOSIS — J309 Allergic rhinitis, unspecified: Secondary | ICD-10-CM

## 2013-03-04 DIAGNOSIS — I10 Essential (primary) hypertension: Secondary | ICD-10-CM

## 2013-03-04 LAB — HEPATIC FUNCTION PANEL
ALT: 20 U/L (ref 0–35)
AST: 24 U/L (ref 0–37)
Albumin: 4.2 g/dL (ref 3.5–5.2)
Alkaline Phosphatase: 91 U/L (ref 39–117)
Total Protein: 7.5 g/dL (ref 6.0–8.3)

## 2013-03-04 LAB — BASIC METABOLIC PANEL
CO2: 29 mEq/L (ref 19–32)
Calcium: 9.4 mg/dL (ref 8.4–10.5)
Glucose, Bld: 111 mg/dL — ABNORMAL HIGH (ref 70–99)
Potassium: 4.1 mEq/L (ref 3.5–5.1)
Sodium: 136 mEq/L (ref 135–145)

## 2013-03-04 LAB — LIPID PANEL: Triglycerides: 118 mg/dL (ref 0.0–149.0)

## 2013-03-04 MED ORDER — ASPIRIN 81 MG PO TBEC: 81.0000 mg | DELAYED_RELEASE_TABLET | Freq: Every day | ORAL | Status: DC

## 2013-03-04 MED ORDER — FLUTICASONE PROPIONATE 50 MCG/ACT NA SUSP: 2.0000 | Freq: Every day | NASAL | Status: DC

## 2013-03-04 MED ORDER — LOVASTATIN 40 MG PO TABS: 40.0000 mg | ORAL_TABLET | Freq: Every day | ORAL | Status: DC

## 2013-03-04 NOTE — Assessment & Plan Note (Signed)
stable overall by history and exam, recent data reviewed with pt, and pt to continue medical treatment as before,  to f/u any worsening symptoms or concerns Lab Results  Component Value Date   HGBA1C 6.2 03/04/2013

## 2013-03-04 NOTE — Patient Instructions (Addendum)
Please take all new medication as prescribed - the flonase Please also start Aspirin 81 mg - 1 per day , to reduce risk of stroke and heart disease You can also take OTC allegra daily for allergies, and Mucinex OTC to help with the ear and sinus congestion Please continue all other medications as before, and refills have been done if requested. Please continue your efforts at being more active, low cholesterol diet, and weight control. Please continue to monitor your Blood Pressure at home, and return if your blood pressure stays over 150/90 Please go to the LAB in the Basement (turn left off the elevator) for the tests to be done today You will be contacted by phone if any changes need to be made immediately.  Otherwise, you will receive a letter about your results with an explanation Please remember to sign up for My Chart if you have not done so, as this will be important to you in the future with finding out test results, communicating by private email, and scheduling acute appointments online when needed. Please return in 6 months, or sooner if needed, with Lab testing done 3-5 days before

## 2013-03-04 NOTE — Assessment & Plan Note (Signed)
BP controlled at home per pt < 140/90, stable overall by history and exam, recent data reviewed with pt, and pt to continue medical treatment as before,  to f/u any worsening symptoms or concerns BP Readings from Last 3 Encounters:  03/04/13 162/78  09/03/12 142/78  06/02/12 154/82

## 2013-03-04 NOTE — Progress Notes (Signed)
Subjective:    Patient ID: Carrie Page, female    DOB: 1928/05/11, 79 y.o.   MRN: [id number]  HPI Here to f/u; overall doing ok,  Pt denies chest pain, increased sob or doe, wheezing, orthopnea, PND, increased LE swelling, palpitations, dizziness or syncope.  Pt denies polydipsia, polyuria, or low sugar symptoms such as weakness or confusion improved with po intake.  Pt denies new neurological symptoms such as new headache, or facial or extremity weakness or numbness.   Pt states overall good compliance with meds, has been trying to follow lower cholesterol, diabetic diet, with wt overall stable,  but little exercise however. BP at home ave 131/78 per pt.  Does have several wks ongoing nasal allergy symptoms with clearish congestion, itch and sneezing, without fever, pain, ST, cough, swelling or wheezing, with some hearing loss bilat associated Past Medical History  Diagnosis Date  . Impaired glucose tolerance   . Lumbar disc disease 01/26/2012  . Arthritis   . Allergy   . Allergic rhinitis, cause unspecified 01/28/2012  . Degenerative joint disease 01/28/2012  . Hypertension   . Depression   . Anxiety 01/28/2012   Past Surgical History  Procedure Laterality Date  . Lumbar foraminotomy and microdiskectomy  dec 2010    Dr Carolyne Littles  . Abdominal hysterectomy    . Appendectomy    . Gallbladder surgery    . Tonsillectomy    . Back surgery  2010    reports that she has never smoked. She does not have any smokeless tobacco history on file. She reports that she does not drink alcohol or use illicit drugs. family history includes Arthritis in her other; Heart disease in her other; and Stroke in her other. Allergies  Allergen Reactions  . Nitrofurantoin Other (See Comments)    GI upset  . Penicillins   . Pravastatin Other (See Comments)    Myalgias, increased dreams   Current Outpatient Prescriptions on File Prior to Visit  Medication Sig Dispense Refill  . irbesartan (AVAPRO) 300 MG  tablet Take 1 tablet (300 mg total) by mouth daily.  30 tablet  11   No current facility-administered medications on file prior to visit.     Review of Systems  Constitutional: Negative for unexpected weight change, or unusual diaphoresis  HENT: Negative for tinnitus.   Eyes: Negative for photophobia and visual disturbance.  Respiratory: Negative for choking and stridor.   Gastrointestinal: Negative for vomiting and blood in stool.  Genitourinary: Negative for hematuria and decreased urine volume.  Musculoskeletal: Negative for acute joint swelling Skin: Negative for color change and wound.  Neurological: Negative for tremors and numbness other than noted  Psychiatric/Behavioral: Negative for decreased concentration or  hyperactivity.       Objective:   Physical Exam BP 162/78  Pulse 71  Temp(Src) 97.4 F (36.3 C) (Oral)  Ht 5\' 4"  (1.626 m)  Wt 128 lb 4 oz (58.174 kg)  BMI 22 kg/m2  SpO2 98% VS noted, not ill apeparing Constitutional: Pt appears well-developed and well-nourished.  HENT: Head: NCAT.  Right Ear: External ear normal.  Left Ear: External ear normal.  Eyes: Conjunctivae and EOM are normal. Pupils are equal, round, and reactive to light.  Bilat tm's with mild erythema.  Max sinus areas non tender.  Pharynx with mild erythema, no exudate Neck: Normal range of motion. Neck supple.  Cardiovascular: Normal rate and regular rhythm.   Pulmonary/Chest: Effort normal and breath sounds normal.  Neurological: Pt is alert. Not confused  Skin: Skin is warm. No erythema.  Psychiatric: Pt behavior is normal. Thought content normal.     Assessment & Plan:

## 2013-03-04 NOTE — Assessment & Plan Note (Signed)
Mild to mod, for flonase asd course,  to f/u any worsening symptoms or concerns 

## 2013-03-04 NOTE — Assessment & Plan Note (Signed)
stable overall by history and exam, recent data reviewed with pt, and pt to continue medical treatment as before,  to f/u any worsening symptoms or concerns Lab Results  Component Value Date   CHOL 202* 03/04/2013   HDL 64.80 03/04/2013   LDLDIRECT 124.0 03/04/2013   TRIG 118.0 03/04/2013   CHOLHDL 3 03/04/2013

## 2013-09-09 ENCOUNTER — Ambulatory Visit (INDEPENDENT_AMBULATORY_CARE_PROVIDER_SITE_OTHER): Payer: Medicare Other | Admitting: Internal Medicine

## 2013-09-09 ENCOUNTER — Other Ambulatory Visit (INDEPENDENT_AMBULATORY_CARE_PROVIDER_SITE_OTHER): Payer: Medicare Other

## 2013-09-09 ENCOUNTER — Encounter: Payer: Self-pay | Admitting: Internal Medicine

## 2013-09-09 VITALS — BP 152/78 | HR 96 | Temp 97.2°F | Ht 65.0 in | Wt 130.4 lb

## 2013-09-09 DIAGNOSIS — Z0000 Encounter for general adult medical examination without abnormal findings: Secondary | ICD-10-CM

## 2013-09-09 DIAGNOSIS — I10 Essential (primary) hypertension: Secondary | ICD-10-CM

## 2013-09-09 DIAGNOSIS — R7309 Other abnormal glucose: Secondary | ICD-10-CM

## 2013-09-09 DIAGNOSIS — Z23 Encounter for immunization: Secondary | ICD-10-CM

## 2013-09-09 DIAGNOSIS — R7302 Impaired glucose tolerance (oral): Secondary | ICD-10-CM

## 2013-09-09 LAB — CBC WITH DIFFERENTIAL/PLATELET
Basophils Absolute: 0.1 10*3/uL (ref 0.0–0.1)
Basophils Relative: 1.1 % (ref 0.0–3.0)
Eosinophils Relative: 4.2 % (ref 0.0–5.0)
Hemoglobin: 8.8 g/dL — ABNORMAL LOW (ref 12.0–15.0)
Lymphocytes Relative: 38.8 % (ref 12.0–46.0)
Lymphs Abs: 2.3 10*3/uL (ref 0.7–4.0)
MCV: 76.9 fl — ABNORMAL LOW (ref 78.0–100.0)
Monocytes Absolute: 0.4 10*3/uL (ref 0.1–1.0)
Monocytes Relative: 5.9 % (ref 3.0–12.0)
Neutro Abs: 3 10*3/uL (ref 1.4–7.7)
Neutrophils Relative %: 50 % (ref 43.0–77.0)
RBC: 3.52 Mil/uL — ABNORMAL LOW (ref 3.87–5.11)
WBC: 6 10*3/uL (ref 4.5–10.5)

## 2013-09-09 LAB — URINALYSIS, ROUTINE W REFLEX MICROSCOPIC
Hgb urine dipstick: NEGATIVE
Nitrite: NEGATIVE
Specific Gravity, Urine: 1.02 (ref 1.000–1.030)
Total Protein, Urine: NEGATIVE
Urobilinogen, UA: 0.2 (ref 0.0–1.0)
pH: 6 (ref 5.0–8.0)

## 2013-09-09 LAB — HEMOGLOBIN A1C: Hgb A1c MFr Bld: 6.1 % (ref 4.6–6.5)

## 2013-09-09 MED ORDER — LOVASTATIN 40 MG PO TABS: 20.0000 mg | ORAL_TABLET | Freq: Every day | ORAL | Status: DC

## 2013-09-09 MED ORDER — IRBESARTAN 300 MG PO TABS: 300.0000 mg | ORAL_TABLET | Freq: Every day | ORAL | Status: DC

## 2013-09-09 NOTE — Progress Notes (Signed)
Subjective:    Patient ID: Carrie Page, female    DOB: [birth year], 79 y.o.   MRN: [id number]  HPI  Here for wellness and f/u;  Overall doing ok;  Pt denies CP, worsening SOB, DOE, wheezing, orthopnea, PND, worsening LE edema, palpitations, dizziness or syncope.  Pt denies neurological change such as new headache, facial or extremity weakness.  Pt denies polydipsia, polyuria, or low sugar symptoms. Pt states overall good compliance with treatment and medications, good tolerability, and has been trying to follow lower cholesterol diet.  Pt denies worsening depressive symptoms, suicidal ideation or panic. No fever, night sweats, wt loss, loss of appetite, or other constitutional symptoms.  Pt states good ability with ADL's, has low fall risk, home safety reviewed and adequate, no other significant changes in hearing or vision, and only occasionally active with exercise.  Could not tolerate the 40 mg lovastatin, but 20 mg is ok, trying to watch diet better. States Bp at home < 140/90, checks nearly every day at home, rarely up to 155, but has been as low as 99 sbp. Past Medical History  Diagnosis Date  . Impaired glucose tolerance   . Lumbar disc disease 01/26/2012  . Arthritis   . Allergy   . Allergic rhinitis, cause unspecified 01/28/2012  . Degenerative joint disease 01/28/2012  . Hypertension   . Depression   . Anxiety 01/28/2012   Past Surgical History  Procedure Laterality Date  . Lumbar foraminotomy and microdiskectomy  dec 2010    Dr Carolyne Littles  . Abdominal hysterectomy    . Appendectomy    . Gallbladder surgery    . Tonsillectomy    . Back surgery  2010    reports that she has never smoked. She does not have any smokeless tobacco history on file. She reports that she does not drink alcohol or use illicit drugs. family history includes Arthritis in her other; Heart disease in her other; Stroke in her other. Allergies  Allergen Reactions  . Nitrofurantoin Other (See Comments)   GI upset  . Penicillins   . Pravastatin Other (See Comments)    Myalgias, increased dreams   Current Outpatient Prescriptions on File Prior to Visit  Medication Sig Dispense Refill  . aspirin 81 MG EC tablet Take 1 tablet (81 mg total) by mouth daily. Swallow whole.  30 tablet  12  . fluticasone (FLONASE) 50 MCG/ACT nasal spray Place 2 sprays into the nose daily.  16 g  6   No current facility-administered medications on file prior to visit.    Review of Systems Constitutional: Negative for diaphoresis, activity change, appetite change or unexpected weight change.  HENT: Negative for hearing loss, ear pain, facial swelling, mouth sores and neck stiffness.   Eyes: Negative for pain, redness and visual disturbance.  Respiratory: Negative for shortness of breath and wheezing.   Cardiovascular: Negative for chest pain and palpitations.  Gastrointestinal: Negative for diarrhea, blood in stool, abdominal distention or other pain Genitourinary: Negative for hematuria, flank pain or change in urine volume.  Musculoskeletal: Negative for myalgias and joint swelling.  Skin: Negative for color change and wound.  Neurological: Negative for syncope and numbness. other than noted Hematological: Negative for adenopathy.  Psychiatric/Behavioral: Negative for hallucinations, self-injury, decreased concentration and agitation.      Objective:   Physical Exam BP 152/78  Pulse 96  Temp(Src) 97.2 F (36.2 C) (Oral)  Ht 5\' 5"  (1.651 m)  Wt 130 lb 6 oz (59.138 kg)  BMI 21.7 kg/m2  SpO2 98% VS noted,  Constitutional: Pt is oriented to person, place, and time. Appears well-developed and well-nourished.  Head: Normocephalic and atraumatic.  Right Ear: External ear normal.  Left Ear: External ear normal.  Nose: Nose normal.  Mouth/Throat: Oropharynx is clear and moist.  Eyes: Conjunctivae and EOM are normal. Pupils are equal, round, and reactive to light.  Neck: Normal range of motion. Neck  supple. No JVD present. No tracheal deviation present.  Cardiovascular: Normal rate, regular rhythm, normal heart sounds and intact distal pulses.   Pulmonary/Chest: Effort normal and breath sounds normal.  Abdominal: Soft. Bowel sounds are normal. There is no tenderness. No HSM  Musculoskeletal: Normal range of motion. Exhibits no edema.  Lymphadenopathy:  Has no cervical adenopathy.  Neurological: Pt is alert and oriented to person, place, and time. Pt has normal reflexes. No cranial nerve deficit.  Skin: Skin is warm and dry. No rash noted.  Psychiatric:  Has  normal mood and affect. Behavior is normal.     Assessment & Plan:

## 2013-09-09 NOTE — Assessment & Plan Note (Signed)

## 2013-09-09 NOTE — Patient Instructions (Addendum)
You had the flu shot today, and the Prevnar pneumonia shot Please continue all other medications as before, including the lovastatin at half of the 40 mg Please have the pharmacy call with any other refills you may need. Please continue your efforts at being more active, low cholesterol diet, and weight control. You are otherwise up to date with prevention measures today. Please keep your appointments with your specialists as you may have planned Please continue to monitor your blood pressure at home, as your goal is to be less than 140/90  Please go to the LAB in the Basement (turn left off the elevator) for the tests to be done today You will be contacted by phone if any changes need to be made immediately.  Otherwise, you will receive a letter about your results with an explanation, but please check with MyChart first.  Please remember to sign up for My Chart if you have not done so, as this will be important to you in the future with finding out test results, communicating by private email, and scheduling acute appointments online when needed.  Please return in 6 months, or sooner if needed, with Lab testing done 3-5 days before

## 2013-09-09 NOTE — Assessment & Plan Note (Signed)
Stable off med,  to f/u any worsening symptoms or concerns  

## 2013-09-09 NOTE — Assessment & Plan Note (Signed)
stable overall by history and exam, recent data reviewed with pt, and pt to continue medical treatment as before,  to f/u any worsening symptoms or concerns, for a1c Lab Results  Component Value Date   HGBA1C 6.2 03/04/2013

## 2013-09-10 ENCOUNTER — Ambulatory Visit: Payer: Medicare Other

## 2013-09-10 ENCOUNTER — Encounter: Payer: Self-pay | Admitting: Internal Medicine

## 2013-09-10 ENCOUNTER — Other Ambulatory Visit: Payer: Self-pay | Admitting: Internal Medicine

## 2013-09-10 DIAGNOSIS — D509 Iron deficiency anemia, unspecified: Secondary | ICD-10-CM

## 2013-09-10 DIAGNOSIS — D649 Anemia, unspecified: Secondary | ICD-10-CM

## 2013-09-10 LAB — LIPID PANEL
Cholesterol: 181 mg/dL (ref 0–200)
HDL: 59.9 mg/dL (ref >39.00)
LDL Cholesterol: 104 mg/dL — ABNORMAL HIGH (ref 0–99)
Total CHOL/HDL Ratio: 3
Triglycerides: 85 mg/dL (ref 0.0–149.0)

## 2013-09-10 LAB — HEPATIC FUNCTION PANEL
ALT: 18 U/L (ref 0–35)
AST: 23 U/L (ref 0–37)
Total Bilirubin: 0.5 mg/dL (ref 0.3–1.2)
Total Protein: 7 g/dL (ref 6.0–8.3)

## 2013-09-10 LAB — BASIC METABOLIC PANEL
BUN: 19 mg/dL (ref 6–23)
CO2: 24 mEq/L (ref 19–32)
Calcium: 9.2 mg/dL (ref 8.4–10.5)
Chloride: 106 mEq/L (ref 96–112)
Creatinine, Ser: 0.6 mg/dL (ref 0.4–1.2)
Glucose, Bld: 104 mg/dL — ABNORMAL HIGH (ref 70–99)

## 2013-09-10 LAB — IBC PANEL
Iron: 67 ug/dL (ref 42–145)
Saturation Ratios: 12.8 % — ABNORMAL LOW (ref 20.0–50.0)
Transferrin: 372.9 mg/dL — ABNORMAL HIGH (ref 212.0–360.0)

## 2013-09-10 LAB — FERRITIN: Ferritin: 4.3 ng/mL — ABNORMAL LOW (ref 10.0–291.0)

## 2013-10-28 ENCOUNTER — Encounter: Payer: Self-pay | Admitting: Internal Medicine

## 2014-03-14 ENCOUNTER — Telehealth: Payer: Self-pay | Admitting: Internal Medicine

## 2014-03-14 NOTE — Telephone Encounter (Signed)
Patient wants to know if she should go to the lab to have her iron tested before coming in for her visit tomorrow. Advised her that Dr. Jenny Reichmann was not in today but I would put a message in to see if labs were needed prior to visit.

## 2014-03-15 ENCOUNTER — Encounter: Payer: Self-pay | Admitting: Gastroenterology

## 2014-03-15 ENCOUNTER — Other Ambulatory Visit: Payer: Self-pay | Admitting: Internal Medicine

## 2014-03-15 ENCOUNTER — Encounter: Payer: Self-pay | Admitting: Internal Medicine

## 2014-03-15 ENCOUNTER — Ambulatory Visit (INDEPENDENT_AMBULATORY_CARE_PROVIDER_SITE_OTHER): Payer: Medicare Other | Admitting: Internal Medicine

## 2014-03-15 ENCOUNTER — Other Ambulatory Visit (INDEPENDENT_AMBULATORY_CARE_PROVIDER_SITE_OTHER): Payer: Medicare Other

## 2014-03-15 VITALS — BP 152/72 | HR 72 | Temp 97.3°F | Ht 65.0 in | Wt 130.2 lb

## 2014-03-15 DIAGNOSIS — E785 Hyperlipidemia, unspecified: Secondary | ICD-10-CM

## 2014-03-15 DIAGNOSIS — D509 Iron deficiency anemia, unspecified: Secondary | ICD-10-CM

## 2014-03-15 DIAGNOSIS — R7309 Other abnormal glucose: Secondary | ICD-10-CM

## 2014-03-15 DIAGNOSIS — R7302 Impaired glucose tolerance (oral): Secondary | ICD-10-CM

## 2014-03-15 DIAGNOSIS — I10 Essential (primary) hypertension: Secondary | ICD-10-CM

## 2014-03-15 LAB — LIPID PANEL
CHOL/HDL RATIO: 4
Cholesterol: 217 mg/dL — ABNORMAL HIGH (ref 0–200)
HDL: 61.7 mg/dL (ref >39.00)
LDL Cholesterol: 137 mg/dL — ABNORMAL HIGH (ref 0–99)
TRIGLYCERIDES: 92 mg/dL (ref 0.0–149.0)
VLDL: 18.4 mg/dL (ref 0.0–40.0)

## 2014-03-15 LAB — CBC WITH DIFFERENTIAL/PLATELET
BASOS PCT: 0.9 % (ref 0.0–3.0)
Basophils Absolute: 0.1 10*3/uL (ref 0.0–0.1)
Eosinophils Absolute: 0.4 10*3/uL (ref 0.0–0.7)
Eosinophils Relative: 5.3 % — ABNORMAL HIGH (ref 0.0–5.0)
HEMATOCRIT: 30.8 % — AB (ref 36.0–46.0)
Hemoglobin: 10.1 g/dL — ABNORMAL LOW (ref 12.0–15.0)
Lymphocytes Relative: 37.7 % (ref 12.0–46.0)
Lymphs Abs: 2.8 10*3/uL (ref 0.7–4.0)
MCHC: 32.7 g/dL (ref 30.0–36.0)
MCV: 83.7 fl (ref 78.0–100.0)
MONO ABS: 0.5 10*3/uL (ref 0.1–1.0)
Monocytes Relative: 6.1 % (ref 3.0–12.0)
Neutro Abs: 3.7 10*3/uL (ref 1.4–7.7)
Neutrophils Relative %: 50 % (ref 43.0–77.0)
Platelets: 282 10*3/uL (ref 150.0–400.0)
RBC: 3.68 Mil/uL — AB (ref 3.87–5.11)
RDW: 13.6 % (ref 11.5–14.6)
WBC: 7.5 10*3/uL (ref 4.5–10.5)

## 2014-03-15 LAB — IBC PANEL
Iron: 9 ug/dL — ABNORMAL LOW (ref 42–145)
Saturation Ratios: 1.8 % — ABNORMAL LOW (ref 20.0–50.0)
Transferrin: 361.9 mg/dL — ABNORMAL HIGH (ref 212.0–360.0)

## 2014-03-15 LAB — BASIC METABOLIC PANEL
BUN: 18 mg/dL (ref 6–23)
CALCIUM: 9.6 mg/dL (ref 8.4–10.5)
CO2: 28 meq/L (ref 19–32)
CREATININE: 0.5 mg/dL (ref 0.4–1.2)
Chloride: 104 mEq/L (ref 96–112)
GFR: 116.97 mL/min (ref >60.00)
Glucose, Bld: 107 mg/dL — ABNORMAL HIGH (ref 70–99)
Potassium: 4.4 mEq/L (ref 3.5–5.1)
Sodium: 138 mEq/L (ref 135–145)

## 2014-03-15 NOTE — Progress Notes (Signed)
Pre visit review using our clinic review tool, if applicable. No additional management support is needed unless otherwise documented below in the visit note. 

## 2014-03-15 NOTE — Patient Instructions (Addendum)
Please continue all other medications as before, except OK to take HALF of the avapro for those days when your BP might be less than 110 on the top number  OK to stay off the lovastatin as you have been  Please have the pharmacy call with any other refills you may need.  Please go to the LAB in the Basement (turn left off the elevator) for the tests to be done today  You will be contacted by phone if any changes need to be made immediately.  Otherwise, you will receive a letter about your results with an explanation, but please check with MyChart first.  Please remember to sign up for MyChart if you have not done so, as this will be important to you in the future with finding out test results, communicating by private email, and scheduling acute appointments online when needed.  Please return in 6 months, or sooner if needed

## 2014-03-15 NOTE — Progress Notes (Signed)
Subjective:    Patient ID: Carrie Page, female    DOB: 06-16-1928, 80 y.o.   MRN: [id number]  HPI  Here to f/u; overall doing ok,  Pt denies chest pain, increased sob or doe, wheezing, orthopnea, PND, increased LE swelling, palpitations, but has had signficant general weakness and some dizziness with SBP some less than 100 at times, checks her BP at home daily.  Pt denies polydipsia, polyuria, or low sugar symptoms such as weakness or confusion improved with po intake.  Pt denies new neurological symptoms such as new headache, or facial or extremity weakness or numbness.   Pt states overall good compliance with meds, has been trying to follow lower cholesterol, diabetic diet, with wt overall stable,  Could not tolerate the statin due to come cognitive slowing and myalgias, no statin for approx 6 wks. No overt bleeding or bruising Past Medical History  Diagnosis Date  . Impaired glucose tolerance   . Lumbar disc disease 01/26/2012  . Arthritis   . Allergy   . Allergic rhinitis, cause unspecified 01/28/2012  . Degenerative joint disease 01/28/2012  . Hypertension   . Depression   . Anxiety 01/28/2012   Past Surgical History  Procedure Laterality Date  . Lumbar foraminotomy and microdiskectomy  dec 2010    Dr Dutch Gray  . Abdominal hysterectomy    . Appendectomy    . Gallbladder surgery    . Tonsillectomy    . Back surgery  2010    reports that she has never smoked. She does not have any smokeless tobacco history on file. She reports that she does not drink alcohol or use illicit drugs. family history includes Arthritis in her other; Heart disease in her other; Stroke in her other. Allergies  Allergen Reactions  . Lovastatin Other (See Comments)    Cognitive slowing and myalgias  . Nitrofurantoin Other (See Comments)    GI upset  . Penicillins   . Pravastatin Other (See Comments)    Myalgias, increased dreams   Current Outpatient Prescriptions on File Prior to Visit    Medication Sig Dispense Refill  . aspirin 81 MG EC tablet Take 1 tablet (81 mg total) by mouth daily. Swallow whole.  30 tablet  12  . fluticasone (FLONASE) 50 MCG/ACT nasal spray Place 2 sprays into the nose daily.  16 g  6  . irbesartan (AVAPRO) 300 MG tablet Take 1 tablet (300 mg total) by mouth daily.  90 tablet  3   No current facility-administered medications on file prior to visit.   Review of Systems  Constitutional: Negative for unexpected weight change, or unusual diaphoresis  HENT: Negative for tinnitus.   Eyes: Negative for photophobia and visual disturbance.  Respiratory: Negative for choking and stridor.   Gastrointestinal: Negative for vomiting and blood in stool.  Genitourinary: Negative for hematuria and decreased urine volume.  Musculoskeletal: Negative for acute joint swelling Skin: Negative for color change and wound.  Neurological: Negative for tremors and numbness other than noted  Psychiatric/Behavioral: Negative for decreased concentration or  hyperactivity.       Objective:   Physical Exam BP 152/72  Pulse 72  Temp(Src) 97.3 F (36.3 C) (Oral)  Ht 5\' 5"  (1.651 m)  Wt 130 lb 4 oz (59.081 kg)  BMI 21.67 kg/m2  SpO2 98% VS noted,  Constitutional: Pt appears well-developed and well-nourished.  HENT: Head: NCAT.  Right Ear: External ear normal.  Left Ear: External ear normal.  Eyes: Conjunctivae and EOM are normal.  Pupils are equal, round, and reactive to light.  Neck: Normal range of motion. Neck supple.  Cardiovascular: Normal rate and regular rhythm.   Pulmonary/Chest: Effort normal and breath sounds normal.  Abd:  Soft, NT, non-distended, + BS Neurological: Pt is alert. Not confused  Skin: Skin is warm. No erythema.  Psychiatric: Pt behavior is normal. Thought content normal.     Assessment & Plan:

## 2014-03-20 NOTE — Assessment & Plan Note (Signed)
stable overall by history and exam, recent data reviewed with pt, and pt to continue medical treatment as before,  to f/u any worsening symptoms or concerns Lab Results  Component Value Date   LDLCALC 137* 03/15/2014   Declines further statin trial

## 2014-03-20 NOTE — Assessment & Plan Note (Signed)
stable overall by history and exam, recent data reviewed with pt, and pt to continue medical treatment as before,  to f/u any worsening symptoms or concerns Lab Results  Component Value Date   HGBA1C 6.1 09/09/2013    

## 2014-03-20 NOTE — Assessment & Plan Note (Signed)
Some overcontrolled, ok to take half avapro for SBP < 110,  to f/u any worsening symptoms or concerns

## 2014-04-05 ENCOUNTER — Encounter: Payer: Self-pay | Admitting: Gastroenterology

## 2014-04-05 ENCOUNTER — Ambulatory Visit (INDEPENDENT_AMBULATORY_CARE_PROVIDER_SITE_OTHER): Payer: Medicare Other | Admitting: Gastroenterology

## 2014-04-05 VITALS — BP 150/76 | HR 72 | Ht 63.0 in | Wt 131.0 lb

## 2014-04-05 DIAGNOSIS — D509 Iron deficiency anemia, unspecified: Secondary | ICD-10-CM

## 2014-04-05 MED ORDER — NA SULFATE-K SULFATE-MG SULF 17.5-3.13-1.6 GM/177ML PO SOLN: 1.0000 | Freq: Once | ORAL | Status: DC

## 2014-04-05 NOTE — Assessment & Plan Note (Signed)
New-onset iron deficiency anemia.  Presumably this is 2 to chronic GI blood loss.  Etiologies including neoplasm, polyps, AVMs are possibilities.  Recommendations #1 colonoscopy; if negative I will proceed with upper endoscopy

## 2014-04-05 NOTE — Progress Notes (Signed)
_                                                                                                                History of Present Illness: Pleasant 80 year old white female referred for evaluation of anemia.  In October, 2014 hemoglobin was 8.8 with MCV 76.9.  She was placed on iron and repeat hemoglobin in April was 10.1.  She rarely has seen blood coating the stool.  She denies abdominal pain, change in bowel habits, melena or frank hematochezia.  She is on no gastric irritants including nonsteroidals.  She takes a baby aspirin.    Past Medical History  Diagnosis Date  . Impaired glucose tolerance   . Lumbar disc disease 01/26/2012  . Arthritis   . Allergy   . Allergic rhinitis, cause unspecified 01/28/2012  . Degenerative joint disease 01/28/2012  . Hypertension   . Depression   . Anxiety 01/28/2012   Past Surgical History  Procedure Laterality Date  . Lumbar foraminotomy and microdiskectomy  dec 2010    Dr Dutch Gray  . Abdominal hysterectomy    . Appendectomy    . Gallbladder surgery    . Tonsillectomy    . Back surgery  2010   family history includes Arthritis in her other; Heart disease in her other; Stroke in her other. Current Outpatient Prescriptions  Medication Sig Dispense Refill  . aspirin 81 MG EC tablet Take 1 tablet (81 mg total) by mouth daily. Swallow whole.  30 tablet  12  . Cholecalciferol (VITAMIN D3) 1000 UNITS CAPS Take 1 capsule by mouth daily.      . ferrous sulfate 325 (65 FE) MG tablet Take 325 mg by mouth daily with breakfast.      . fluticasone (FLONASE) 50 MCG/ACT nasal spray Place 2 sprays into the nose daily.  16 g  6  . irbesartan (AVAPRO) 300 MG tablet Take 1 tablet (300 mg total) by mouth daily.  90 tablet  3  . Multiple Vitamin (MULTIVITAMIN) tablet Take 1 tablet by mouth daily.       No current facility-administered medications for this visit.   Allergies as of 04/05/2014 - Review Complete 04/05/2014  Allergen Reaction  Noted  . Lovastatin Other (See Comments) 03/15/2014  . Nitrofurantoin Other (See Comments) 09/03/2012  . Penicillins  06/02/2012  . Pravastatin Other (See Comments) 11/27/2012    reports that she has never smoked. She has never used smokeless tobacco. She reports that she does not drink alcohol or use illicit drugs.     Review of Systems: Pertinent positive and negative review of systems were noted in the above HPI section. All other review of systems were otherwise negative.  Vital signs were reviewed in today's medical record Physical Exam: General: Well developed , well nourished, no acute distress Skin: anicteric Head: Normocephalic and atraumatic Eyes:  sclerae anicteric, EOMI Ears: Normal auditory acuity Mouth: No deformity or lesions Neck: Supple, no masses or thyromegaly Lungs: Clear throughout to auscultation Heart: Regular rate and rhythm; no  murmurs, rubs or bruits Abdomen: Soft, non tender and non distended. No masses, hepatosplenomegaly or hernias noted. Normal Bowel sounds Rectal:deferred Musculoskeletal: Symmetrical with no gross deformities  Skin: No lesions on visible extremities Pulses:  Normal pulses noted Extremities: No clubbing, cyanosis, edema or deformities noted Neurological: Alert oriented x 4, grossly nonfocal Cervical Nodes:  No significant cervical adenopathy Inguinal Nodes: No significant inguinal adenopathy Psychological:  Alert and cooperative. Normal mood and affect  See Assessment and Plan under Problem List

## 2014-04-05 NOTE — Addendum Note (Signed)
Addended by: Oda Kilts on: 04/05/2014 09:52 AM   Modules accepted: Orders

## 2014-04-05 NOTE — Patient Instructions (Signed)
You have been scheduled for a colonoscopy with propofol. Please follow written instructions given to you at your visit today.  Please pick up your prep kit at the pharmacy within the next 1-3 days. If you use inhalers (even only as needed), please bring them with you on the day of your procedure. Your physician has requested that you go to www.startemmi.com and enter the access code given to you at your visit today. This web site gives a general overview about your procedure. However, you should still follow specific instructions given to you by our office regarding your preparation for the procedure.  HOLD IRON 5 DAYS BEFORE YOUR COLONOSCOPY SUPREP SENT TO YOUR PHARMACY

## 2014-04-11 ENCOUNTER — Encounter: Payer: Self-pay | Admitting: Gastroenterology

## 2014-04-20 ENCOUNTER — Encounter: Payer: Medicare Other | Admitting: Gastroenterology

## 2014-05-03 ENCOUNTER — Ambulatory Visit (AMBULATORY_SURGERY_CENTER): Payer: Medicare Other | Admitting: Gastroenterology

## 2014-05-03 ENCOUNTER — Telehealth: Payer: Self-pay

## 2014-05-03 ENCOUNTER — Encounter: Payer: Self-pay | Admitting: Gastroenterology

## 2014-05-03 ENCOUNTER — Other Ambulatory Visit (INDEPENDENT_AMBULATORY_CARE_PROVIDER_SITE_OTHER): Payer: Medicare Other

## 2014-05-03 ENCOUNTER — Other Ambulatory Visit: Payer: Self-pay

## 2014-05-03 VITALS — BP 165/70 | HR 79 | Temp 97.5°F | Resp 19 | Ht 63.0 in | Wt 131.0 lb

## 2014-05-03 DIAGNOSIS — C180 Malignant neoplasm of cecum: Secondary | ICD-10-CM

## 2014-05-03 DIAGNOSIS — K639 Disease of intestine, unspecified: Secondary | ICD-10-CM

## 2014-05-03 DIAGNOSIS — K6389 Other specified diseases of intestine: Secondary | ICD-10-CM

## 2014-05-03 DIAGNOSIS — D509 Iron deficiency anemia, unspecified: Secondary | ICD-10-CM

## 2014-05-03 LAB — COMPREHENSIVE METABOLIC PANEL
ALBUMIN: 3.7 g/dL (ref 3.5–5.2)
ALT: 15 U/L (ref 0–35)
AST: 23 U/L (ref 0–37)
Alkaline Phosphatase: 80 U/L (ref 39–117)
BUN: 11 mg/dL (ref 6–23)
CALCIUM: 8.9 mg/dL (ref 8.4–10.5)
CHLORIDE: 105 meq/L (ref 96–112)
CO2: 24 meq/L (ref 19–32)
Creatinine, Ser: 0.6 mg/dL (ref 0.4–1.2)
GFR: 112.04 mL/min (ref >60.00)
Glucose, Bld: 93 mg/dL (ref 70–99)
POTASSIUM: 3.6 meq/L (ref 3.5–5.1)
SODIUM: 138 meq/L (ref 135–145)
TOTAL PROTEIN: 6.6 g/dL (ref 6.0–8.3)
Total Bilirubin: 0.6 mg/dL (ref 0.2–1.2)

## 2014-05-03 LAB — CBC WITH DIFFERENTIAL/PLATELET
BASOS ABS: 0.1 10*3/uL (ref 0.0–0.1)
Basophils Relative: 1.4 % (ref 0.0–3.0)
Eosinophils Absolute: 0.1 10*3/uL (ref 0.0–0.7)
Eosinophils Relative: 2.9 % (ref 0.0–5.0)
HCT: 29.8 % — ABNORMAL LOW (ref 36.0–46.0)
HEMOGLOBIN: 9.8 g/dL — AB (ref 12.0–15.0)
LYMPHS PCT: 33.3 % (ref 12.0–46.0)
Lymphs Abs: 1.7 10*3/uL (ref 0.7–4.0)
MCHC: 32.9 g/dL (ref 30.0–36.0)
MCV: 85.4 fl (ref 78.0–100.0)
MONOS PCT: 4.4 % (ref 3.0–12.0)
Monocytes Absolute: 0.2 10*3/uL (ref 0.1–1.0)
NEUTROS ABS: 3 10*3/uL (ref 1.4–7.7)
NEUTROS PCT: 58 % (ref 43.0–77.0)
Platelets: 274 10*3/uL (ref 150.0–400.0)
RBC: 3.49 Mil/uL — ABNORMAL LOW (ref 3.87–5.11)
RDW: 16.6 % — AB (ref 11.5–15.5)
WBC: 5.1 10*3/uL (ref 4.0–10.5)

## 2014-05-03 MED ORDER — SODIUM CHLORIDE 0.9 % IV SOLN: 500.0000 mL | INTRAVENOUS | Status: DC

## 2014-05-03 NOTE — Telephone Encounter (Signed)
Pt scheduled for CT of CAP at [facility] CT 05/06/14@10 :30am. Pt to be NPO after 6:30am, drink bottle 1 of contrast at 8:30am, bottle 2 at 9:30am. Pt to have labs today after procedure. Jack RN to review instructions and appt with pt.

## 2014-05-03 NOTE — Progress Notes (Signed)
Patient accompanied to lab by Crisoforo Oxford. Advised patient CT scheduled for Friday, May 29th at 10:30am at [place]. Directions and contrast given to patient prior to discharge.

## 2014-05-03 NOTE — Progress Notes (Signed)
Report to pacu rn, vss, bbs=clear 

## 2014-05-03 NOTE — Patient Instructions (Addendum)
YOU HAD AN ENDOSCOPIC PROCEDURE TODAY AT THE [facility]: Refer to the procedure report that was given to you for any specific questions about what was found during the examination.  If the procedure report does not answer your questions, please call your gastroenterologist to clarify.  If you requested that your care partner not be given the details of your procedure findings, then the procedure report has been included in a sealed envelope for you to review at your convenience later.  YOU SHOULD EXPECT: Some feelings of bloating in the abdomen. Passage of more gas than usual.  Walking can help get rid of the air that was put into your GI tract during the procedure and reduce the bloating. If you had a lower endoscopy (such as a colonoscopy or flexible sigmoidoscopy) you may notice spotting of blood in your stool or on the toilet paper. If you underwent a bowel prep for your procedure, then you may not have a normal bowel movement for a few days.  DIET: Your first meal following the procedure should be a light meal and then it is ok to progress to your normal diet.  A half-sandwich or bowl of soup is an example of a good first meal.  Heavy or fried foods are harder to digest and may make you feel nauseous or bloated.  Likewise meals heavy in dairy and vegetables can cause extra gas to form and this can also increase the bloating.  Drink plenty of fluids but you should avoid alcoholic beverages for 24 hours.  ACTIVITY: Your care partner should take you home directly after the procedure.  You should plan to take it easy, moving slowly for the rest of the day.  You can resume normal activity the day after the procedure however you should NOT DRIVE or use heavy machinery for 24 hours (because of the sedation medicines used during the test).    SYMPTOMS TO REPORT IMMEDIATELY: A gastroenterologist can be reached at any hour.  During normal business hours, 8:30 AM to 5:00 PM Monday through Friday,  call [phone].  After hours and on weekends, please call the GI answering service at [phone] who will take a message and have the physician on call contact you.   Following lower endoscopy (colonoscopy or flexible sigmoidoscopy):  Excessive amounts of blood in the stool  Significant tenderness or worsening of abdominal pains  Swelling of the abdomen that is new, acute  Fever of 100F or higher  FOLLOW UP: If any biopsies were taken you will be contacted by phone or by letter within the next 1-3 weeks.  Call your gastroenterologist if you have not heard about the biopsies in 3 weeks.  Our staff will call the home number listed on your records the next business day following your procedure to check on you and address any questions or concerns that you may have at that time regarding the information given to you following your procedure. This is a courtesy call and so if there is no answer at the home number and we have not heard from you through the emergency physician on call, we will assume that you have returned to your regular daily activities without incident.  SIGNATURES/CONFIDENTIALITY: You and/or your care partner have signed paperwork which will be entered into your electronic medical record.  These signatures attest to the fact that that the information above on your After Visit Summary has been reviewed and is understood.  Full responsibility of the confidentiality of this   discharge information lies with you and/or your care-partner.  Recommendations CT of the chest/abdomen/pelvis Surgical referral Next colonoscopy in 1 year

## 2014-05-03 NOTE — Op Note (Addendum)
[facility]  [address] [place], [address]   COLONOSCOPY PROCEDURE REPORT  PATIENT: Carrie Page, Carrie Page  MR#: [id number] BIRTHDATE: 10/31/1928 , 85  yrs. old GENDER: Female ENDOSCOPIST: Inda Castle, MD REFERRED BY: PROCEDURE DATE:  05/03/2014 PROCEDURE:   Colonoscopy with biopsy First Screening Colonoscopy - Avg.  risk and is 50 yrs.  old or older Yes.  Prior Negative Screening - Now for repeat screening. N/A  History of Adenoma - Now for follow-up colonoscopy & has been > or = to 3 yrs.  N/A  Polyps Removed Today? Yes. ASA CLASS:   Class II INDICATIONS:Iron Deficiency Anemia. MEDICATIONS: MAC sedation, administered by CRNA and Propofol (Diprivan) 230 mg IV  DESCRIPTION OF PROCEDURE:   After the risks benefits and alternatives of the procedure were thoroughly explained, informed consent was obtained.  A digital rectal exam revealed no abnormalities of the rectum.   The LB [id number]  endoscope was introduced through the anus and advanced to the cecum, which was identified by both the appendix and ileocecal valve. No adverse events experienced.   The quality of the prep was Suprep good  The instrument was then slowly withdrawn as the colon was fully examined.      COLON FINDINGS: In the cecum there was a large, exophytic, friable mass measuring at least 5 cm.  Multiple biopsies were taken.   In the cecum there was a large, exophytic, friable mass measuring at least 5 cm.  Multiple biopsies were taken.   The colon mucosa was otherwise normal.  Retroflexed views revealed no abnormalities. The time to cecum=3 minutes 34 seconds.  Withdrawal time=8 minutes 15 seconds.  The scope was withdrawn and the procedure completed. COMPLICATIONS: There were no complications.  ENDOSCOPIC IMPRESSION: 1.   malignant neoplasm-cecum  RECOMMENDATIONS: CT of the chest//abdomen/ pelvis CEA surgical referral colonoscopy one year  eSigned:  Inda Castle, MD  05/03/2014 2:57 PM Revised: 05/03/2014 2:57 PM  cc: Jonita Albee, MD

## 2014-05-03 NOTE — Progress Notes (Signed)
Called to room to assist during endoscopic procedure.  Patient ID and intended procedure confirmed with present staff. Received instructions for my participation in the procedure from the performing physician.  

## 2014-05-04 ENCOUNTER — Telehealth: Payer: Self-pay | Admitting: *Deleted

## 2014-05-04 ENCOUNTER — Telehealth: Payer: Self-pay

## 2014-05-04 LAB — CEA: CEA: 1.4 ng/mL (ref 0.0–5.0)

## 2014-05-04 NOTE — Telephone Encounter (Signed)
  Follow up Call-  Call back number 05/03/2014  Post procedure Call Back phone  # [phone]  Permission to leave phone message Yes     Patient questions:  Do you have a fever, pain , or abdominal swelling? no Pain Score  0 *  Have you tolerated food without any problems? yes  Have you been able to return to your normal activities? yes  Do you have any questions about your discharge instructions: Diet   no Medications  no Follow up visit  no  Do you have questions or concerns about your Care? yes  Actions: * If pain score is 4 or above: No action needed, pain <4. Patient questioning if surgery  consult will be after CT completed.

## 2014-05-04 NOTE — Telephone Encounter (Signed)
Message copied by Algernon Huxley on Wed May 04, 2014  3:46 PM ------      Message from: Aviva Signs      Created: Tue May 03, 2014  3:56 PM      Regarding: RE: Appointment       Pt is scheduled for June 9 arrive at 9.20am for a 9.40am apt date and time.I have sent a msg back to Nurse for an earlier apt date.Marland KitchenMarland KitchenThayer Headings      ----- Message -----         From: Maury Dus, RN         Sent: 05/03/2014   3:09 PM           To: Aviva Signs      Subject: Appointment                                              Thayer Headings,            This pt needs and appt with Dr. Henri Medal for malignant mass in cecum. Pt had colon done today and has CT of CAP scheduled for this Friday. Please let me know when she can be seen.            Thanks,      Rosanne Sack RN       ------

## 2014-05-04 NOTE — Telephone Encounter (Signed)
Spoke with pt and she is aware of appt date and time. 

## 2014-05-06 ENCOUNTER — Ambulatory Visit (INDEPENDENT_AMBULATORY_CARE_PROVIDER_SITE_OTHER)
Admission: RE | Admit: 2014-05-06 | Discharge: 2014-05-06 | Disposition: A | Discharge status: Home / Self Care | Payer: Medicare Other | Source: Ambulatory Visit | Attending: Gastroenterology | Admitting: Gastroenterology

## 2014-05-06 DIAGNOSIS — K6389 Other specified diseases of intestine: Secondary | ICD-10-CM

## 2014-05-06 DIAGNOSIS — K639 Disease of intestine, unspecified: Secondary | ICD-10-CM

## 2014-05-06 MED ORDER — IOHEXOL 300 MG/ML  SOLN
100.0000 mL | Freq: Once | INTRAMUSCULAR | Status: AC | PRN
  Administered 2014-05-06: 100 mL via INTRAVENOUS

## 2014-05-17 ENCOUNTER — Encounter (INDEPENDENT_AMBULATORY_CARE_PROVIDER_SITE_OTHER): Payer: Self-pay | Admitting: General Surgery

## 2014-05-17 ENCOUNTER — Ambulatory Visit (INDEPENDENT_AMBULATORY_CARE_PROVIDER_SITE_OTHER): Payer: Medicare Other | Admitting: General Surgery

## 2014-05-17 VITALS — BP 132/78 | HR 87 | Temp 97.8°F | Ht 64.0 in | Wt 126.0 lb

## 2014-05-17 DIAGNOSIS — C180 Malignant neoplasm of cecum: Secondary | ICD-10-CM

## 2014-05-17 MED ORDER — METRONIDAZOLE 500 MG PO TABS: 500.0000 mg | ORAL_TABLET | ORAL | Status: DC

## 2014-05-17 NOTE — Progress Notes (Signed)
Chief Complaint  Patient presents with  . eval mass    HISTORY: Carrie Page is a 80 y.o. female who presents to the office with a cecal mass.  This was found on colonoscopy performed due to anemia.  Workup thus far has included CEA and CT scans.  There is no evidence of distal metastasis.  She denies any bleeding in her stool now.  She denies any weight loss.  She has no FH of colon cancer. She has no heart problems and is very active.  She lives alone.         Past Medical History  Diagnosis Date  . Impaired glucose tolerance   . Lumbar disc disease 01/26/2012  . Arthritis   . Allergy   . Allergic rhinitis, cause unspecified 01/28/2012  . Degenerative joint disease 01/28/2012  . Hypertension   . Depression   . Anxiety 01/28/2012  . Anemia       Past Surgical History  Procedure Laterality Date  . Lumbar foraminotomy and microdiskectomy  dec 2010    Dr Dutch Gray  . Abdominal hysterectomy    . Appendectomy    . Gallbladder surgery    . Tonsillectomy    . Back surgery  2010      Current Outpatient Prescriptions  Medication Sig Dispense Refill  . Cholecalciferol (VITAMIN D3) 1000 UNITS CAPS Take 1 capsule by mouth daily.      . ferrous sulfate 325 (65 FE) MG tablet Take 325 mg by mouth daily with breakfast.      . fluticasone (FLONASE) 50 MCG/ACT nasal spray Place 2 sprays into the nose daily.  16 g  6  . irbesartan (AVAPRO) 300 MG tablet Take 1 tablet (300 mg total) by mouth daily.  90 tablet  3  . Multiple Vitamin (MULTIVITAMIN) tablet Take 1 tablet by mouth daily.      Marland Kitchen aspirin 81 MG EC tablet Take 1 tablet (81 mg total) by mouth daily. Swallow whole.  30 tablet  12  . metroNIDAZOLE (FLAGYL) 500 MG tablet Take 1 tablet (500 mg total) by mouth as directed.  3 tablet  0   No current facility-administered medications for this visit.      Allergies  Allergen Reactions  . Lovastatin Other (See Comments)    Cognitive slowing and myalgias  . Nitrofurantoin Other (See  Comments)    GI upset  . Penicillins   . Pravastatin Other (See Comments)    Myalgias, increased dreams      Family History  Problem Relation Age of Onset  . Arthritis Other   . Heart disease Other   . Stroke Other       History   Social History  . Marital Status: Widowed    Spouse Name: N/A    Number of Children: N/A  . Years of Education: N/A   Occupational History  . retired    Social History Main Topics  . Smoking status: Never Smoker   . Smokeless tobacco: Never Used  . Alcohol Use: No  . Drug Use: No  . Sexual Activity: None   Other Topics Concern  . None   Social History Narrative  . None       REVIEW OF SYSTEMS - PERTINENT POSITIVES ONLY: Review of Systems - General ROS: negative for - chills or fever Respiratory ROS: no cough, shortness of breath, or wheezing Cardiovascular ROS: no chest pain or dyspnea on exertion Gastrointestinal ROS: no abdominal pain, change in bowel habits, or black  or bloody stools Genito-Urinary ROS: no dysuria, trouble voiding, or hematuria  EXAM: Filed Vitals:   05/17/14 1000  BP: 132/78  Pulse: 87  Temp: 97.8 F (36.6 C)    Gen:  No acute distress.  Well nourished and well groomed.   Neurological: Alert and oriented to person, place, and time. Coordination normal.  Head: Normocephalic and atraumatic.  Eyes: Conjunctivae are normal. Pupils are equal, round, and reactive to light. No scleral icterus.  Neck: Normal range of motion. Neck supple. No tracheal deviation or thyromegaly present.  No cervical lymphadenopathy. Cardiovascular: Normal rate, regular rhythm, normal heart sounds and intact distal pulses. Respiratory: Effort normal.  No respiratory distress. No chest wall tenderness. Breath sounds normal.  No wheezes, rales or rhonchi.  GI: Soft. Bowel sounds are normal. The abdomen is soft and nontender.  There is no rebound and no guarding.  Musculoskeletal: Normal range of motion. Extremities are nontender.  Skin:  Skin is warm and dry. No rash noted. No diaphoresis. No erythema. No pallor. No clubbing, cyanosis, or edema.   Psychiatric: Normal mood and affect. Behavior is normal. Judgment and thought content normal.      LABORATORY RESULTS: Available labs are reviewed  Lab Results  Component Value Date   CEA 1.4 05/03/2014    RADIOLOGY RESULTS:   Images and reports are reviewed. CT abdomen and pelvis IMPRESSION:  1. Focal area of eccentric irregular wall thickening in the cecum,  compatible with the known neoplasm. Small adjacent lymph nodes in  the ileocolic mesentery raise concern for metastatic involvement.  2. 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    ASSESSMENT AND PLAN:  Carrie Page is a 80 y.o. F who was found to have a cecal cancer on diagnostic colonoscopy.  She has no signs of distal metastatic disease.  She does have an enlarged lymph node around the ileocolic artery.  I have recommended laparoscopic resection.   We discussed the risks of surgery, which are bleeding, damage to adjacent structures, infection.  In her she is at a slight increased risk of injury to perihepatic structures due to her h/o open cholecystectomy.  All questions were answered.    The surgery and anatomy were described to the patient as well as the risks of surgery and the possible complications.  These include: Bleeding, deep abdominal infections and possible wound complications such as hernia and infection, damage to adjacent structures, leak of surgical connections, which can lead to other surgeries and possibly an ostomy, possible need for other procedures, such as abscess drains in radiology, possible prolonged hospital stay, possible diarrhea from removal of part of the colon, possible constipation from narcotics, prolonged fatigue/weakness or appetite loss, possible early recurrence of of disease, possible complications of their medical problems such as heart disease or arrhythmias or lung  problems, death (less than 1%). I believe the patient understands and wishes to proceed with the surgery.    Rosario Adie, MD Colon and Rectal Surgery / [facility]      Visit Diagnoses: 1. Cecal cancer     Primary Care Physician: Cathlean Cower, MD

## 2014-05-17 NOTE — Patient Instructions (Addendum)
[facility]  ONE-DAY (1) PRE-OP HOME COLON PREP INSTRUCTIONS: ** MIRALAX / GATORADE PREP / FLAGYL**  You must follow the instructions below carefully.  If you have questions or problems, please call and speak to someone in the clinic department at our office:   [phone].     INSTRUCTIONS: 1. Five days prior to your procedure do not eat nuts, popcorn, or fruit with seeds.  Stop all fiber supplements such as Metamucil, Citrucel, etc. 2. Two days before surgery fill the prescription at a pharmacy of your choice and purchase the additional supplies below.         [place] TABS    Purchase a bottle of MIRALAX  (255 gm bottle)    In addition, purchase four (4) DULCOLAX TABLETS (no prescription required- ask the pharmacist if you can't find them   Purchase one 64 oz GATORADE.  (Do NOT purchase red Gatorade; any other flavor is acceptable) and place in refrigerator to get cold.  3.   Day Before Surgery:   6 am: Wash you abdomen with soap and repeat this on the morning of surgery and take 4 Dulcolax tablets   You may only have clear liquids (tea, coffee, juice, broth, jello, soft drinks, gummy bears).  You cannot have solid foods, cream, milk or milk products.  Drink at lease 8 ounces of liquids every hour while awake.   Take the Flagyl prescription as directed at 8 am, 2 pm and 8 pm.  It is helpful to take this with some jello instead of on an empty stomach.  Any flavor is ok, except red jello, which will cause red stools.   Mix half the bottle of MiraLax and the Gatorade in a large container.    10:00am: Begin drinking the Gatorade mixture until gone (8 oz every 15-30 minutes).      You may suck on a lime wedge or hard candy to "freshen your palate" in between glasses   If you are a diabetic, take your blood sugar reading several time throughout the prep.  Have some juice available to take if your sugar level gets too low   You may feel chilled while taking the  prep.  Have some warm tea or broth to help warm up.   Continue clear liquids until midnight or bedtime  3. The day of your procedure:   Do not eat or drink ANYTHING after midnight before your surgery.     If you take Heart or Blood Pressure medicine, ask the pre-op nurses about these during your preop appointment.   Take a regular Fleet Enema one hour before leaving the come to the hospital.  Try to hold it in 5-10 mins before expelling.   Further pre-operative instructions will be given to you from the hospital.   Expect to be contacted 5-7 days before your surgery.

## 2014-06-07 ENCOUNTER — Encounter (HOSPITAL_COMMUNITY): Payer: Self-pay | Admitting: Pharmacy Technician

## 2014-06-13 ENCOUNTER — Other Ambulatory Visit (HOSPITAL_COMMUNITY): Payer: Self-pay | Admitting: Anesthesiology

## 2014-06-13 NOTE — Progress Notes (Signed)
Chest ct 05-06-14 epic

## 2014-06-14 ENCOUNTER — Other Ambulatory Visit: Payer: Self-pay

## 2014-06-14 ENCOUNTER — Encounter (HOSPITAL_COMMUNITY)
Admission: RE | Admit: 2014-06-14 | Discharge: 2014-06-14 | Disposition: A | Discharge status: Home / Self Care | Payer: Medicare Other | Source: Ambulatory Visit | Attending: General Surgery | Admitting: General Surgery

## 2014-06-14 ENCOUNTER — Encounter (HOSPITAL_COMMUNITY): Payer: Self-pay

## 2014-06-14 DIAGNOSIS — Z01812 Encounter for preprocedural laboratory examination: Secondary | ICD-10-CM | POA: Insufficient documentation

## 2014-06-14 DIAGNOSIS — Z01810 Encounter for preprocedural cardiovascular examination: Secondary | ICD-10-CM | POA: Insufficient documentation

## 2014-06-14 HISTORY — DX: Personal history of other malignant neoplasm of skin: Z85.828

## 2014-06-14 HISTORY — DX: Nausea with vomiting, unspecified: R11.2

## 2014-06-14 HISTORY — DX: Age-related osteoporosis without current pathological fracture: M81.0

## 2014-06-14 HISTORY — DX: Other specified postprocedural states: R11.2

## 2014-06-14 HISTORY — DX: Malignant neoplasm of cecum: C18.0

## 2014-06-14 HISTORY — DX: Other specified postprocedural states: Z98.890

## 2014-06-14 LAB — BASIC METABOLIC PANEL
Anion gap: 12 (ref 5–15)
BUN: 21 mg/dL (ref 6–23)
CALCIUM: 9.3 mg/dL (ref 8.4–10.5)
CO2: 26 mEq/L (ref 19–32)
Chloride: 100 mEq/L (ref 96–112)
Creatinine, Ser: 0.56 mg/dL (ref 0.50–1.10)
GFR calc non Af Amer: 84 mL/min — ABNORMAL LOW (ref >90)
Glucose, Bld: 100 mg/dL — ABNORMAL HIGH (ref 70–99)
POTASSIUM: 4.7 meq/L (ref 3.7–5.3)
Sodium: 138 mEq/L (ref 137–147)

## 2014-06-14 LAB — CBC
HCT: 33.5 % — ABNORMAL LOW (ref 36.0–46.0)
Hemoglobin: 10.6 g/dL — ABNORMAL LOW (ref 12.0–15.0)
MCH: 27 pg (ref 26.0–34.0)
MCHC: 31.6 g/dL (ref 30.0–36.0)
MCV: 85.2 fL (ref 78.0–100.0)
PLATELETS: 255 10*3/uL (ref 150–400)
RBC: 3.93 MIL/uL (ref 3.87–5.11)
RDW: 14.8 % (ref 11.5–15.5)
WBC: 6.5 10*3/uL (ref 4.0–10.5)

## 2014-06-14 NOTE — Patient Instructions (Signed)
Carrie Page  06/14/2014                           YOUR PROCEDURE IS SCHEDULED ON: 06/20/14 AT 7:30 AM                ENTER [name] ENTRANCE AND                            FOLLOW  SIGNS TO SHORT STAY CENTER                 ARRIVE AT SHORT STAY AT: 5:30 AM               CALL THIS NUMBER IF ANY PROBLEMS THE DAY OF SURGERY :               [phone]                                REMEMBER:   Do not eat food or drink liquids AFTER MIDNIGHT                 Take these medicines the morning of surgery with               A SIPS OF WATER :    NONE     Do not wear jewelry, make-up   Do not wear lotions, powders, or perfumes.   Do not shave legs or underarms 12 hrs. before surgery (men may shave face)  Do not bring valuables to the hospital.  Contacts, dentures or bridgework may not be worn into surgery.  Leave suitcase in the car. After surgery it may be brought to your room.  For patients admitted to the hospital more than one night, checkout time is            11:00 AM                                                        ________________________________________________________________________                                                                        [place]  Before surgery, you can play an important role.  Because skin is not sterile, your skin needs to be as free of germs as possible.  You can reduce the number of germs on your skin by washing with CHG (chlorahexidine gluconate) soap before surgery.  CHG is an antiseptic cleaner which kills germs and bonds with the skin to continue killing germs even after washing. Please DO NOT use if you have an allergy to CHG or antibacterial soaps.  If your skin becomes reddened/irritated stop using the CHG and inform your nurse when you arrive at Short Stay. Do not shave (including legs and underarms) for at least 48 hours prior to the first CHG shower.  You may  shave your face. Please follow these instructions carefully:   1.  Shower with CHG Soap the night before surgery and the  morning of Surgery.   2.  If you choose to wash your hair, wash your hair first as usual with your  normal  Shampoo.   3.  After you shampoo, rinse your hair and body thoroughly to remove the  shampoo.                                         4.  Use CHG as you would any other liquid soap.  You can apply chg directly  to the skin and wash . Gently wash with scrungie or clean wascloth    5.  Apply the CHG Soap to your body ONLY FROM THE NECK DOWN.   Do not use on open                           Wound or open sores. Avoid contact with eyes, ears mouth and genitals (private parts).                        Genitals (private parts) with your normal soap.              6.  Wash thoroughly, paying special attention to the area where your surgery  will be performed.   7.  Thoroughly rinse your body with warm water from the neck down.   8.  DO NOT shower/wash with your normal soap after using and rinsing off  the CHG Soap .                9.  Pat yourself dry with a clean towel.             10.  Wear clean pajamas.             11.  Place clean sheets on your bed the night of your first shower and do not  sleep with pets.  Day of Surgery : Do not apply any lotions/deodorants the morning of surgery.  Please wear clean clothes to the hospital/surgery center.  FAILURE TO FOLLOW THESE INSTRUCTIONS MAY RESULT IN THE CANCELLATION OF YOUR SURGERY    PATIENT SIGNATURE_________________________________  ______________________________________________________________________    WHAT IS A BLOOD TRANSFUSION? Blood Transfusion Information  A transfusion is the replacement of blood or some of its parts. Blood is made up of multiple cells which provide different functions.  Red blood cells carry oxygen and are used for blood loss replacement.  White blood cells fight against  infection.  Platelets control bleeding.  Plasma helps clot blood.  Other blood products are available for specialized needs, such as hemophilia or other clotting disorders. BEFORE THE TRANSFUSION  Who gives blood for transfusions?   Healthy volunteers who are fully evaluated to make sure their blood is safe. This is blood bank blood. Transfusion therapy is the safest it has ever been in the practice of medicine. Before blood is taken from a donor, a complete history is taken to make sure that person has no history of diseases nor engages in risky social behavior (examples are intravenous drug use or sexual activity with multiple partners). The donor's travel history is screened to minimize risk of transmitting infections, such as malaria. The donated blood is tested for  signs of infectious diseases, such as HIV and hepatitis. The blood is then tested to be sure it is compatible with you in order to minimize the chance of a transfusion reaction. If you or a relative donates blood, this is often done in anticipation of surgery and is not appropriate for emergency situations. It takes many days to process the donated blood. RISKS AND COMPLICATIONS Although transfusion therapy is very safe and saves many lives, the main dangers of transfusion include:   Getting an infectious disease.  Developing a transfusion reaction. This is an allergic reaction to something in the blood you were given. Every precaution is taken to prevent this. The decision to have a blood transfusion has been considered carefully by your caregiver before blood is given. Blood is not given unless the benefits outweigh the risks. AFTER THE TRANSFUSION  Right after receiving a blood transfusion, you will usually feel much better and more energetic. This is especially true if your red blood cells have gotten low (anemic). The transfusion raises the level of the red blood cells which carry oxygen, and this usually causes an energy  increase.  The nurse administering the transfusion will monitor you carefully for complications. HOME CARE INSTRUCTIONS  No special instructions are needed after a transfusion. You may find your energy is better. Speak with your caregiver about any limitations on activity for underlying diseases you may have. SEEK MEDICAL CARE IF:   Your condition is not improving after your transfusion.  You develop redness or irritation at the intravenous (IV) site. SEEK IMMEDIATE MEDICAL CARE IF:  Any of the following symptoms occur over the next 12 hours:  Shaking chills.  You have a temperature by mouth above 102 F (38.9 C), not controlled by medicine.  Chest, back, or muscle pain.  People around you feel you are not acting correctly or are confused.  Shortness of breath or difficulty breathing.  Dizziness and fainting.  You get a rash or develop hives.  You have a decrease in urine output.  Your urine turns a dark color or changes to pink, red, or brown. Any of the following symptoms occur over the next 10 days:  You have a temperature by mouth above 102 F (38.9 C), not controlled by medicine.  Shortness of breath.  Weakness after normal activity.  The white part of the eye turns yellow (jaundice).  You have a decrease in the amount of urine or are urinating less often.  Your urine turns a dark color or changes to pink, red, or brown. Document Released: 11/22/2000 Document Revised: 02/17/2012 Document Reviewed: 07/11/2008 [facility] Patient Information 2014 [employer], [place].  _______________________________________________________________________

## 2014-06-20 ENCOUNTER — Inpatient Hospital Stay (HOSPITAL_COMMUNITY)
Admission: RE | Admit: 2014-06-20 | Discharge: 2014-06-24 | DRG: 330 | Disposition: A | Discharge status: Home / Self Care | Payer: Medicare Other | Source: Ambulatory Visit | Attending: General Surgery | Admitting: General Surgery

## 2014-06-20 ENCOUNTER — Encounter (HOSPITAL_COMMUNITY): Payer: Self-pay | Admitting: *Deleted

## 2014-06-20 ENCOUNTER — Encounter (HOSPITAL_COMMUNITY): Payer: Medicare Other | Admitting: Anesthesiology

## 2014-06-20 ENCOUNTER — Encounter (HOSPITAL_COMMUNITY)
Admission: RE | Disposition: A | Discharge status: Home / Self Care | Payer: Self-pay | Source: Ambulatory Visit | Attending: General Surgery

## 2014-06-20 ENCOUNTER — Inpatient Hospital Stay (HOSPITAL_COMMUNITY): Payer: Medicare Other | Admitting: Anesthesiology

## 2014-06-20 DIAGNOSIS — F329 Major depressive disorder, single episode, unspecified: Secondary | ICD-10-CM | POA: Diagnosis present

## 2014-06-20 DIAGNOSIS — Z9089 Acquired absence of other organs: Secondary | ICD-10-CM

## 2014-06-20 DIAGNOSIS — C189 Malignant neoplasm of colon, unspecified: Secondary | ICD-10-CM | POA: Diagnosis present

## 2014-06-20 DIAGNOSIS — C772 Secondary and unspecified malignant neoplasm of intra-abdominal lymph nodes: Secondary | ICD-10-CM | POA: Diagnosis present

## 2014-06-20 DIAGNOSIS — D649 Anemia, unspecified: Secondary | ICD-10-CM | POA: Diagnosis present

## 2014-06-20 DIAGNOSIS — F411 Generalized anxiety disorder: Secondary | ICD-10-CM | POA: Diagnosis present

## 2014-06-20 DIAGNOSIS — I10 Essential (primary) hypertension: Secondary | ICD-10-CM | POA: Diagnosis present

## 2014-06-20 DIAGNOSIS — C180 Malignant neoplasm of cecum: Secondary | ICD-10-CM | POA: Diagnosis present

## 2014-06-20 DIAGNOSIS — E785 Hyperlipidemia, unspecified: Secondary | ICD-10-CM | POA: Diagnosis present

## 2014-06-20 DIAGNOSIS — C182 Malignant neoplasm of ascending colon: Secondary | ICD-10-CM

## 2014-06-20 DIAGNOSIS — Z01812 Encounter for preprocedural laboratory examination: Secondary | ICD-10-CM | POA: Diagnosis not present

## 2014-06-20 DIAGNOSIS — K639 Disease of intestine, unspecified: Secondary | ICD-10-CM | POA: Diagnosis present

## 2014-06-20 DIAGNOSIS — F3289 Other specified depressive episodes: Secondary | ICD-10-CM | POA: Diagnosis present

## 2014-06-20 HISTORY — PX: LAPAROSCOPIC PARTIAL COLECTOMY: SHX5907

## 2014-06-20 LAB — TYPE AND SCREEN
ABO/RH(D): O POS
Antibody Screen: NEGATIVE

## 2014-06-20 LAB — ABO/RH: ABO/RH(D): O POS

## 2014-06-20 SURGERY — LAPAROSCOPIC PARTIAL COLECTOMY
Anesthesia: General | Site: Abdomen

## 2014-06-20 MED ORDER — DIPHENHYDRAMINE HCL 12.5 MG/5ML PO ELIX
12.5000 mg | ORAL_SOLUTION | Freq: Four times a day (QID) | ORAL | Status: DC | PRN
  Administered 2014-06-20: 12.5 mg via ORAL
  Filled 2014-06-20: qty 5

## 2014-06-20 MED ORDER — ALVIMOPAN 12 MG PO CAPS
12.0000 mg | ORAL_CAPSULE | Freq: Two times a day (BID) | ORAL | Status: DC
  Administered 2014-06-21 – 2014-06-23 (×5): 12 mg via ORAL
  Filled 2014-06-20 (×6): qty 1

## 2014-06-20 MED ORDER — SUCCINYLCHOLINE CHLORIDE 20 MG/ML IJ SOLN
INTRAMUSCULAR | Status: DC | PRN
  Administered 2014-06-20: 60 mg via INTRAVENOUS

## 2014-06-20 MED ORDER — DEXTROSE 5 % IV SOLN
5.0000 mg/kg | INTRAVENOUS | Status: AC
  Administered 2014-06-20: 290 mg via INTRAVENOUS
  Filled 2014-06-20: qty 7.25

## 2014-06-20 MED ORDER — PROMETHAZINE HCL 25 MG/ML IJ SOLN
6.2500 mg | INTRAMUSCULAR | Status: DC | PRN
  Administered 2014-06-20: 6.25 mg via INTRAVENOUS

## 2014-06-20 MED ORDER — FENTANYL CITRATE 0.05 MG/ML IJ SOLN
INTRAMUSCULAR | Status: DC | PRN
  Administered 2014-06-20 (×2): 50 ug via INTRAVENOUS
  Administered 2014-06-20: 100 ug via INTRAVENOUS
  Administered 2014-06-20: 50 ug via INTRAVENOUS

## 2014-06-20 MED ORDER — EPHEDRINE SULFATE 50 MG/ML IJ SOLN
INTRAMUSCULAR | Status: AC
  Filled 2014-06-20: qty 1

## 2014-06-20 MED ORDER — NEOSTIGMINE METHYLSULFATE 10 MG/10ML IV SOLN
INTRAVENOUS | Status: DC | PRN
Start: 2014-06-20 — End: 2014-06-20
  Administered 2014-06-20: 3 mg via INTRAVENOUS

## 2014-06-20 MED ORDER — BUPIVACAINE-EPINEPHRINE 0.25% -1:200000 IJ SOLN
INTRAMUSCULAR | Status: DC | PRN
  Administered 2014-06-20: 20 mL

## 2014-06-20 MED ORDER — LIDOCAINE HCL (CARDIAC) 20 MG/ML IV SOLN
INTRAVENOUS | Status: AC
  Filled 2014-06-20: qty 5

## 2014-06-20 MED ORDER — CISATRACURIUM BESYLATE (PF) 10 MG/5ML IV SOLN
INTRAVENOUS | Status: DC | PRN
  Administered 2014-06-20: 4 mg via INTRAVENOUS
  Administered 2014-06-20: 2 mg via INTRAVENOUS
  Administered 2014-06-20: 6 mg via INTRAVENOUS

## 2014-06-20 MED ORDER — CLINDAMYCIN PHOSPHATE 900 MG/50ML IV SOLN
900.0000 mg | INTRAVENOUS | Status: AC
  Administered 2014-06-20: 900 mg via INTRAVENOUS
  Filled 2014-06-20: qty 50

## 2014-06-20 MED ORDER — LACTATED RINGERS IR SOLN
Status: DC | PRN
  Administered 2014-06-20: 1

## 2014-06-20 MED ORDER — HYDROMORPHONE HCL PF 1 MG/ML IJ SOLN
0.2500 mg | INTRAMUSCULAR | Status: DC | PRN
  Administered 2014-06-20 (×2): 0.5 mg via INTRAVENOUS

## 2014-06-20 MED ORDER — SODIUM CHLORIDE 0.9 % IJ SOLN
INTRAMUSCULAR | Status: AC
  Filled 2014-06-20: qty 10

## 2014-06-20 MED ORDER — LACTATED RINGERS IV SOLN
INTRAVENOUS | Status: DC | PRN
  Administered 2014-06-20 (×2): via INTRAVENOUS

## 2014-06-20 MED ORDER — PROMETHAZINE HCL 25 MG/ML IJ SOLN
INTRAMUSCULAR | Status: AC
  Filled 2014-06-20: qty 1

## 2014-06-20 MED ORDER — BUPIVACAINE-EPINEPHRINE (PF) 0.25% -1:200000 IJ SOLN
INTRAMUSCULAR | Status: AC
  Filled 2014-06-20: qty 30

## 2014-06-20 MED ORDER — METOPROLOL TARTRATE 1 MG/ML IV SOLN
5.0000 mg | Freq: Four times a day (QID) | INTRAVENOUS | Status: DC | PRN
  Filled 2014-06-20: qty 5

## 2014-06-20 MED ORDER — LIDOCAINE HCL (CARDIAC) 20 MG/ML IV SOLN
INTRAVENOUS | Status: DC | PRN
  Administered 2014-06-20: 50 mg via INTRAVENOUS

## 2014-06-20 MED ORDER — HYDROCODONE-ACETAMINOPHEN 5-325 MG PO TABS
1.0000 | ORAL_TABLET | ORAL | Status: DC | PRN
  Administered 2014-06-20: 2 via ORAL
  Filled 2014-06-20: qty 2

## 2014-06-20 MED ORDER — ACETAMINOPHEN 10 MG/ML IV SOLN
1000.0000 mg | Freq: Once | INTRAVENOUS | Status: AC
  Administered 2014-06-20: 1000 mg via INTRAVENOUS
  Filled 2014-06-20: qty 100

## 2014-06-20 MED ORDER — ONDANSETRON HCL 4 MG/2ML IJ SOLN: 4.0000 mg | Freq: Four times a day (QID) | INTRAMUSCULAR | Status: DC | PRN

## 2014-06-20 MED ORDER — PROPOFOL 10 MG/ML IV BOLUS
INTRAVENOUS | Status: AC
  Filled 2014-06-20: qty 20

## 2014-06-20 MED ORDER — ENOXAPARIN SODIUM 40 MG/0.4ML ~~LOC~~ SOLN
40.0000 mg | SUBCUTANEOUS | Status: DC
  Administered 2014-06-21 – 2014-06-24 (×4): 40 mg via SUBCUTANEOUS
  Filled 2014-06-20 (×5): qty 0.4

## 2014-06-20 MED ORDER — ALVIMOPAN 12 MG PO CAPS
12.0000 mg | ORAL_CAPSULE | Freq: Once | ORAL | Status: DC
  Filled 2014-06-20: qty 1

## 2014-06-20 MED ORDER — ONDANSETRON HCL 4 MG/2ML IJ SOLN
INTRAMUSCULAR | Status: DC | PRN
Start: 2014-06-20 — End: 2014-06-20
  Administered 2014-06-20: 4 mg via INTRAVENOUS

## 2014-06-20 MED ORDER — HYDROMORPHONE HCL PF 1 MG/ML IJ SOLN
INTRAMUSCULAR | Status: AC
  Filled 2014-06-20: qty 1

## 2014-06-20 MED ORDER — EPHEDRINE SULFATE 50 MG/ML IJ SOLN
INTRAMUSCULAR | Status: DC | PRN
  Administered 2014-06-20: 10 mg via INTRAVENOUS

## 2014-06-20 MED ORDER — FENTANYL CITRATE 0.05 MG/ML IJ SOLN
INTRAMUSCULAR | Status: AC
  Filled 2014-06-20: qty 5

## 2014-06-20 MED ORDER — 0.9 % SODIUM CHLORIDE (POUR BTL) OPTIME
TOPICAL | Status: DC | PRN
  Administered 2014-06-20: 1000 mL

## 2014-06-20 MED ORDER — ACETAMINOPHEN 500 MG PO TABS
1000.0000 mg | ORAL_TABLET | Freq: Four times a day (QID) | ORAL | Status: AC
  Filled 2014-06-20: qty 2

## 2014-06-20 MED ORDER — ALVIMOPAN 12 MG PO CAPS
12.0000 mg | ORAL_CAPSULE | Freq: Once | ORAL | Status: AC
  Administered 2014-06-20: 12 mg via ORAL
  Filled 2014-06-20: qty 1

## 2014-06-20 MED ORDER — PROPOFOL 10 MG/ML IV BOLUS
INTRAVENOUS | Status: DC | PRN
  Administered 2014-06-20: 100 mg via INTRAVENOUS

## 2014-06-20 MED ORDER — GLYCOPYRROLATE 0.2 MG/ML IJ SOLN
INTRAMUSCULAR | Status: DC | PRN
  Administered 2014-06-20: .4 mg via INTRAVENOUS

## 2014-06-20 MED ORDER — ONDANSETRON HCL 4 MG PO TABS: 4.0000 mg | ORAL_TABLET | Freq: Four times a day (QID) | ORAL | Status: DC | PRN

## 2014-06-20 MED ORDER — MORPHINE SULFATE 2 MG/ML IJ SOLN: 2.0000 mg | INTRAMUSCULAR | Status: DC | PRN

## 2014-06-20 MED ORDER — KCL IN DEXTROSE-NACL 20-5-0.45 MEQ/L-%-% IV SOLN
INTRAVENOUS | Status: DC
  Administered 2014-06-21 – 2014-06-22 (×3): via INTRAVENOUS
  Filled 2014-06-20 (×3): qty 1000

## 2014-06-20 MED ORDER — ONDANSETRON HCL 4 MG/2ML IJ SOLN
INTRAMUSCULAR | Status: AC
  Filled 2014-06-20: qty 2

## 2014-06-20 MED ORDER — IRBESARTAN 300 MG PO TABS
300.0000 mg | ORAL_TABLET | Freq: Every morning | ORAL | Status: DC
  Administered 2014-06-20 – 2014-06-24 (×5): 300 mg via ORAL
  Filled 2014-06-20 (×5): qty 1

## 2014-06-20 MED ORDER — DIPHENHYDRAMINE HCL 50 MG/ML IJ SOLN
12.5000 mg | Freq: Four times a day (QID) | INTRAMUSCULAR | Status: DC | PRN
  Administered 2014-06-20: 12.5 mg via INTRAVENOUS
  Filled 2014-06-20: qty 1

## 2014-06-20 MED ORDER — CISATRACURIUM BESYLATE 20 MG/10ML IV SOLN
INTRAVENOUS | Status: AC
  Filled 2014-06-20: qty 10

## 2014-06-20 SURGICAL SUPPLY — 81 items
APPLIER CLIP 5 13 M/L LIGAMAX5 (MISCELLANEOUS)
BLADE EXTENDED COATED 6.5IN (ELECTRODE) ×3 IMPLANT
BLADE HEX COATED 2.75 (ELECTRODE) ×6 IMPLANT
BLADE SURG SZ10 CARB STEEL (BLADE) ×3 IMPLANT
CABLE HIGH FREQUENCY MONO STRZ (ELECTRODE) IMPLANT
CANISTER SUCTION 2500CC (MISCELLANEOUS) ×3 IMPLANT
CELLS DAT CNTRL 66122 CELL SVR (MISCELLANEOUS) ×1 IMPLANT
CHLORAPREP W/TINT 26ML (MISCELLANEOUS) ×3 IMPLANT
CLIP APPLIE 5 13 M/L LIGAMAX5 (MISCELLANEOUS) IMPLANT
COVER MAYO STAND STRL (DRAPES) ×3 IMPLANT
DECANTER SPIKE VIAL GLASS SM (MISCELLANEOUS) ×3 IMPLANT
DERMABOND ADVANCED (GAUZE/BANDAGES/DRESSINGS) ×2
DERMABOND ADVANCED .7 DNX12 (GAUZE/BANDAGES/DRESSINGS) ×1 IMPLANT
DRAIN CHANNEL 19F RND (DRAIN) IMPLANT
DRAPE LAPAROSCOPIC ABDOMINAL (DRAPES) ×3 IMPLANT
DRAPE LG THREE QUARTER DISP (DRAPES) ×3 IMPLANT
DRAPE UTILITY XL STRL (DRAPES) ×6 IMPLANT
DRAPE WARM FLUID 44X44 (DRAPE) ×3 IMPLANT
DRSG OPSITE POSTOP 4X10 (GAUZE/BANDAGES/DRESSINGS) ×3 IMPLANT
DRSG OPSITE POSTOP 4X6 (GAUZE/BANDAGES/DRESSINGS) ×3 IMPLANT
DRSG OPSITE POSTOP 4X8 (GAUZE/BANDAGES/DRESSINGS) IMPLANT
ELECT REM PT RETURN 9FT ADLT (ELECTROSURGICAL) ×3
ELECTRODE REM PT RTRN 9FT ADLT (ELECTROSURGICAL) ×1 IMPLANT
EVACUATOR SILICONE 100CC (DRAIN) IMPLANT
GAUZE SPONGE 4X4 12PLY STRL (GAUZE/BANDAGES/DRESSINGS) ×3 IMPLANT
GLOVE BIO SURGEON STRL SZ 6.5 (GLOVE) ×4 IMPLANT
GLOVE BIO SURGEONS STRL SZ 6.5 (GLOVE) ×2
GLOVE BIOGEL PI IND STRL 7.0 (GLOVE) ×2 IMPLANT
GLOVE BIOGEL PI INDICATOR 7.0 (GLOVE) ×4
GOWN SPEC L4 XLG W/TWL (GOWN DISPOSABLE) ×12 IMPLANT
KIT BASIN OR (CUSTOM PROCEDURE TRAY) ×3 IMPLANT
LEGGING LITHOTOMY PAIR STRL (DRAPES) IMPLANT
LIGASURE IMPACT 36 18CM CVD LR (INSTRUMENTS) IMPLANT
NS IRRIG 1000ML POUR BTL (IV SOLUTION) ×3 IMPLANT
PACK GENERAL/GYN (CUSTOM PROCEDURE TRAY) ×3 IMPLANT
PENCIL BUTTON HOLSTER BLD 10FT (ELECTRODE) ×3 IMPLANT
RELOAD PROXIMATE 75MM BLUE (ENDOMECHANICALS) ×3 IMPLANT
RTRCTR WOUND ALEXIS 18CM MED (MISCELLANEOUS) ×3
SCISSORS LAP 5X35 DISP (ENDOMECHANICALS) ×3 IMPLANT
SEALER TISSUE G2 CVD JAW 35 (ENDOMECHANICALS) ×1 IMPLANT
SEALER TISSUE G2 CVD JAW 45CM (ENDOMECHANICALS) ×2
SEALER TISSUE G2 STRG ARTC 35C (ENDOMECHANICALS) IMPLANT
SET IRRIG TUBING LAPAROSCOPIC (IRRIGATION / IRRIGATOR) ×3 IMPLANT
SLEEVE XCEL OPT CAN 5 100 (ENDOMECHANICALS) ×3 IMPLANT
SOLUTION ANTI FOG 6CC (MISCELLANEOUS) ×3 IMPLANT
SPONGE LAP 18X18 X RAY DECT (DISPOSABLE) IMPLANT
STAPLER GUN LINEAR PROX 60 (STAPLE) ×3 IMPLANT
STAPLER PROXIMATE 75MM BLUE (STAPLE) ×3 IMPLANT
STAPLER VISISTAT 35W (STAPLE) IMPLANT
SUCTION POOLE TIP (SUCTIONS) ×3 IMPLANT
SUT ETHILON 2 0 PS N (SUTURE) IMPLANT
SUT NOVA 1 T20/GS 25DT (SUTURE) ×9 IMPLANT
SUT NOVA NAB DX-16 0-1 5-0 T12 (SUTURE) ×3 IMPLANT
SUT PDS AB 1 CTX 36 (SUTURE) IMPLANT
SUT PDS AB 1 TP1 96 (SUTURE) IMPLANT
SUT PROLENE 2 0 KS (SUTURE) IMPLANT
SUT SILK 2 0 (SUTURE) ×2
SUT SILK 2 0 SH CR/8 (SUTURE) ×6 IMPLANT
SUT SILK 2-0 18XBRD TIE 12 (SUTURE) ×1 IMPLANT
SUT SILK 3 0 (SUTURE) ×2
SUT SILK 3 0 SH CR/8 (SUTURE) ×6 IMPLANT
SUT SILK 3-0 18XBRD TIE 12 (SUTURE) ×1 IMPLANT
SUT VIC AB 2-0 SH 18 (SUTURE) IMPLANT
SUT VIC AB 4-0 PS2 18 (SUTURE) IMPLANT
SUT VIC AB 4-0 PS2 27 (SUTURE) ×3 IMPLANT
SUT VICRYL 2 0 18  UND BR (SUTURE)
SUT VICRYL 2 0 18 UND BR (SUTURE) IMPLANT
SYR BULB IRRIGATION 50ML (SYRINGE) IMPLANT
SYS LAPSCP GELPORT 120MM (MISCELLANEOUS)
SYSTEM LAPSCP GELPORT 120MM (MISCELLANEOUS) IMPLANT
TOWEL OR 17X26 10 PK STRL BLUE (TOWEL DISPOSABLE) ×6 IMPLANT
TOWEL OR NON WOVEN STRL DISP B (DISPOSABLE) ×6 IMPLANT
TRAY FOLEY CATH 14FRSI W/METER (CATHETERS) ×3 IMPLANT
TRAY LAP CHOLE (CUSTOM PROCEDURE TRAY) ×3 IMPLANT
TROCAR BLADELESS OPT 5 100 (ENDOMECHANICALS) ×3 IMPLANT
TROCAR XCEL 12X100 BLDLESS (ENDOMECHANICALS) IMPLANT
TROCAR XCEL BLUNT TIP 100MML (ENDOMECHANICALS) ×3 IMPLANT
TROCAR XCEL NON-BLD 11X100MML (ENDOMECHANICALS) IMPLANT
TUBING FILTER THERMOFLATOR (ELECTROSURGICAL) ×3 IMPLANT
TUBING INSUFFLATION 10FT LAP (TUBING) ×3 IMPLANT
YANKAUER SUCT BULB TIP 10FT TU (MISCELLANEOUS) ×3 IMPLANT

## 2014-06-20 NOTE — Op Note (Signed)
06/20/2014  10:05 AM  PATIENT:  Carrie Page  80 y.o. female  Patient Care Team: Biagio Borg, MD as PCP - General (Internal Medicine)  PRE-OPERATIVE DIAGNOSIS:  Colon Cancer  POST-OPERATIVE DIAGNOSIS:  Colon Cancer  PROCEDURE:  LAPAROSCOPIC ASSISTED RIGHT COLECTOMY  SURGEON:  Surgeon(s): Leighton Ruff, MD Edward Jolly, MD  ASSISTANT: [place]   ANESTHESIA:   local and general  EBL:  Total I/O In: 1000 [I.V.:1000] Out: 200 [Urine:150; Blood:50]  SPECIMEN:  Source of Specimen:  Right colon  DISPOSITION OF SPECIMEN:  PATHOLOGY  COUNTS:  YES  PLAN OF CARE: Admit to inpatient   PATIENT DISPOSITION:  PACU - hemodynamically stable.   INDICATIONS: This is a 80 y.o. female who presented to my office with a cecal mass. The risk and benefits and alternative treatments were explained to the patient prior to the OR and the patient has elected to proceed with laparoscopic colectomy.  Consent was signed and placed on chart prior to the OR.  OR FINDINGS:  Significant adhesions to the liver from previous open cholecystectomy.  No signs of metastatic disease on the liver or peritoneum.    DESCRIPTION:  The patient was identified & brought into the operating room. The patient was positioned supine with arm tucked. SCDs were active during the entire case. The patient underwent general anesthesia without any difficulty. A foley catheter was inserted under sterile conditions. The abdomen was prepped and draped in a sterile fashion. A Surgical Timeout confirmed our plan.  I made a vertical incision through the inferior umbilical fold. I dissected down through the subcutaneous tissues using blunt dissection and elevated the fascia with 2 Kocher clamps.  The fascia was incised with a scalpel.  Blunt dissection was used to obtain peritoneal entry. I placed a stay suture and then the [facility] port. We induced carbon dioxide insufflation. Camera inspection revealed no injury. I placed  additional ports under direct laparoscopic visualization.  I evaluated the entire abdomen laparoscopically.  The liver appeared normal except for some scarring around the edges, the large and small bowel were normal as well, except for adhesions to the liver and duodenum due to her previous surgery.  There were no signs of metastatic disease.  I began by taking down the adhesions to the RUQ and liver.  I used a combination of sharp and blunt dissection to separate the transverse colon from the liver and duodenum.  After this, I identified the ileocolic artery and vein within the mesentery. Dissection was bluntly carried around these structures. The duodenum was identified and free from the structures. I then separated the structures bluntly and used the Enseal device to transect these separately.  I developed the retroperitoneal plane bluntly.  I then freed the cecum off its attachments to the pelvic wall. I mobilized the terminal ileum.  I took care to avoid injuring any retroperitoneal structures.  After this I began to mobilize laterally down the white line of Toldt and then took down the rest of the hepatic flexure using the Enseal device. I mobilized the omentum off of the right transverse colon. The entire colon was then flipped medially and mobilized off of the retroperitoneal structures until I could visualize the lateral edge of the duodenum underneath.  I gently freed the duodenal attachments.  At that point, I made an enlargement of my umbilical incision using a scalpel. The fascia was then incised using electrocautery. An alexis wound protector was placed. The terminal ileum and right colon were then  removed from the wound. The terminal ileum was transected using a GIA blue load stapler. The remaining mesentery was divided using the Enseal device. I identified a portion of the transverse colon just distal to the hepatic flexure. This was transected using another blue load GIA stapler.  An anastomosis  was created between the terminal ileum and the transverse colon. This was done using a GIA blue load stapler.  The common enterotomy channel was closed using a TA 60 blue load stapler. Hemostasis was good at the staple line. Several 3-0 silk sutures were used to imbricate the edge of the anastomosis and close the mesenteric defect. An anti-tension suture was placed in the crotch of the anastomosis. This was then placed back into the abdomen. The abdomen was then irrigated with normal saline.  Hemostasis was good.  The duodenum was inspected carefully for injury.  It appeared intact. The omentum was then brought down over the anastomosis. The Alexis wound protector was removed, and we switched to clean instruments, gowns and drapes.  The fascia was then closed using #1 Novafil interrupted sutures.  The subcutaneous tissue of the umbilical incision was closed using interrupted 2-0 Vicryl sutures. The skin was then closed using 4-0 Vicryl sutures. Dermabond was placed on the port sites and a sterile dressing was placed over the abdominal incision. All counts were correct per operating room staff. The patient was then awakened from anesthesia and sent to the post anesthesia care unit in stable condition.

## 2014-06-20 NOTE — Transfer of Care (Signed)
Immediate Anesthesia Transfer of Care Note  Patient: Carrie Page  Procedure(s) Performed: Procedure(s): LAPAROSCOPIC ASSISTED RIGHT PARTIAL COLECTOMY (N/A)  Patient Location: PACU  Anesthesia Type:General  Level of Consciousness: awake and oriented  Airway & Oxygen Therapy: Patient Spontanous Breathing and Patient connected to face mask oxygen  Post-op Assessment: Report given to PACU RN and Post -op Vital signs reviewed and stable  Post vital signs: Reviewed and stable  Complications: No apparent anesthesia complications

## 2014-06-20 NOTE — Anesthesia Postprocedure Evaluation (Signed)
  Anesthesia Post-op Note  Patient: Carrie Page  Procedure(s) Performed: Procedure(s) (LRB): LAPAROSCOPIC ASSISTED RIGHT PARTIAL COLECTOMY (N/A)  Patient Location: PACU  Anesthesia Type: General  Level of Consciousness: awake and alert   Airway and Oxygen Therapy: Patient Spontanous Breathing  Post-op Pain: mild  Post-op Assessment: Post-op Vital signs reviewed, Patient's Cardiovascular Status Stable, Respiratory Function Stable, Patent Airway and No signs of Nausea or vomiting  Last Vitals:  Filed Vitals:   06/20/14 1515  BP: 156/67  Pulse: 88  Temp: 36.8 C  Resp: 18    Post-op Vital Signs: stable   Complications: No apparent anesthesia complications

## 2014-06-20 NOTE — Progress Notes (Signed)
PT Cancellation Note  Patient Details Name: Carrie Page MRN: [id number] DOB: 08-15-1928   Cancelled Treatment:    Reason Eval/Treat Not Completed: Other (comment) (Pt had surgery today.will start tomorrow.)   Claretha Cooper 06/20/2014, 12:13 PM Tresa Endo PT [phone]

## 2014-06-20 NOTE — Anesthesia Procedure Notes (Signed)
Procedure Name: Intubation Date/Time: 06/20/2014 7:42 AM Performed by: Danley Danker L Patient Re-evaluated:Patient Re-evaluated prior to inductionOxygen Delivery Method: Circle system utilized Preoxygenation: Pre-oxygenation with 100% oxygen Intubation Type: IV induction Ventilation: Mask ventilation without difficulty Laryngoscope Size: Miller and 2 Grade View: Grade I Tube type: Oral Tube size: 7.0 mm Number of attempts: 1 Airway Equipment and Method: Stylet Placement Confirmation: ETT inserted through vocal cords under direct vision,  breath sounds checked- equal and bilateral and positive ETCO2 Secured at: 21 cm Tube secured with: Tape Dental Injury: Teeth and Oropharynx as per pre-operative assessment

## 2014-06-20 NOTE — Plan of Care (Signed)
Problem: Phase I Progression Outcomes Goal: Incision/dressings dry and intact Outcome: Progressing Dressing upon arrival to room was dry and intact. The 2 dermabond incisions on left side of abdomen are clean and dry

## 2014-06-20 NOTE — Anesthesia Preprocedure Evaluation (Signed)
Anesthesia Evaluation  Patient identified by MRN, date of birth, ID band Patient awake    Reviewed: Allergy & Precautions, H&P , NPO status , Patient's Chart, lab work & pertinent test results  Airway Mallampati: II TM Distance: >3 FB Neck ROM: Full    Dental no notable dental hx.    Pulmonary neg pulmonary ROS,  breath sounds clear to auscultation  Pulmonary exam normal       Cardiovascular hypertension, Pt. on medications Rhythm:Regular Rate:Normal     Neuro/Psych negative neurological ROS  negative psych ROS   GI/Hepatic negative GI ROS, Neg liver ROS,   Endo/Other  negative endocrine ROS  Renal/GU negative Renal ROS  negative genitourinary   Musculoskeletal negative musculoskeletal ROS (+)   Abdominal   Peds negative pediatric ROS (+)  Hematology  (+) anemia ,   Anesthesia Other Findings   Reproductive/Obstetrics negative OB ROS                           Anesthesia Physical Anesthesia Plan  ASA: II  Anesthesia Plan: General   Post-op Pain Management:    Induction: Intravenous  Airway Management Planned: Oral ETT  Additional Equipment:   Intra-op Plan:   Post-operative Plan: Extubation in OR  Informed Consent: I have reviewed the patients History and Physical, chart, labs and discussed the procedure including the risks, benefits and alternatives for the proposed anesthesia with the patient or authorized representative who has indicated his/her understanding and acceptance.   Dental advisory given  Plan Discussed with: CRNA and Surgeon  Anesthesia Plan Comments:         Anesthesia Quick Evaluation

## 2014-06-20 NOTE — H&P (Signed)
Chief Complaint   Patient presents with   .  eval mass   HISTORY:  Carrie Page is a 80 y.o. female who presents to the office with a cecal mass. This was found on colonoscopy performed due to anemia. Workup thus far has included CEA and CT scans. There is no evidence of distal metastasis. She denies any bleeding in her stool now. She denies any weight loss. She has no FH of colon cancer. She has no heart problems and is very active. She lives alone.  Past Medical History   Diagnosis  Date   .  Impaired glucose tolerance    .  Lumbar disc disease  01/26/2012   .  Arthritis    .  Allergy    .  Allergic rhinitis, cause unspecified  01/28/2012   .  Degenerative joint disease  01/28/2012   .  Hypertension    .  Depression    .  Anxiety  01/28/2012   .  Anemia     Past Surgical History   Procedure  Laterality  Date   .  Lumbar foraminotomy and microdiskectomy   dec 2010     Carrie Page   .  Abdominal hysterectomy     .  Appendectomy     .  Gallbladder surgery     .  Tonsillectomy     .  Back surgery   2010    Current Outpatient Prescriptions   Medication  Sig  Dispense  Refill   .  Cholecalciferol (VITAMIN D3) 1000 UNITS CAPS  Take 1 capsule by mouth daily.     .  ferrous sulfate 325 (65 FE) MG tablet  Take 325 mg by mouth daily with breakfast.     .  fluticasone (FLONASE) 50 MCG/ACT nasal spray  Place 2 sprays into the nose daily.  16 g  6   .  irbesartan (AVAPRO) 300 MG tablet  Take 1 tablet (300 mg total) by mouth daily.  90 tablet  3   .  Multiple Vitamin (MULTIVITAMIN) tablet  Take 1 tablet by mouth daily.     Marland Kitchen  aspirin 81 MG EC tablet  Take 1 tablet (81 mg total) by mouth daily. Swallow whole.  30 tablet  12   .  metroNIDAZOLE (FLAGYL) 500 MG tablet  Take 1 tablet (500 mg total) by mouth as directed.  3 tablet  0    No current facility-administered medications for this visit.    Allergies   Allergen  Reactions   .  Lovastatin  Other (See Comments)     Cognitive  slowing and myalgias   .  Nitrofurantoin  Other (See Comments)     GI upset   .  Penicillins    .  Pravastatin  Other (See Comments)     Myalgias, increased dreams    Family History   Problem  Relation  Age of Onset   .  Arthritis  Other    .  Heart disease  Other    .  Stroke  Other     History    Social History   .  Marital Status:  Widowed     Spouse Name:  N/A     Number of Children:  N/A   .  Years of Education:  N/A    Occupational History   .  retired     Social History Main Topics   .  Smoking status:  Never Smoker   .  Smokeless tobacco:  Never Used   .  Alcohol Use:  No   .  Drug Use:  No   .  Sexual Activity:  None    Other Topics  Concern   .  None    Social History Narrative   .  None   REVIEW OF SYSTEMS - PERTINENT POSITIVES ONLY:  Review of Systems - General ROS: negative for - chills or fever  Respiratory ROS: no cough, shortness of breath, or wheezing  Cardiovascular ROS: no chest pain or dyspnea on exertion  Gastrointestinal ROS: no abdominal pain, change in bowel habits, or black or bloody stools  Genito-Urinary ROS: no dysuria, trouble voiding, or hematuria  EXAM:  Filed Vitals:   06/20/14 0639  BP: 168/65  Pulse:   Temp:   Resp:    Gen: No acute distress. Well nourished and well groomed.  Neurological: Alert and oriented to person, place, and time. Coordination normal.  Head: Normocephalic and atraumatic.  Eyes: Conjunctivae are normal. Pupils are equal, round, and reactive to light. No scleral icterus.  Neck: Normal range of motion. Neck supple. No tracheal deviation or thyromegaly present. No cervical lymphadenopathy.  Cardiovascular: Normal rate, regular rhythm, normal heart sounds and intact distal pulses.  Respiratory: Effort normal. No respiratory distress. No chest wall tenderness. Breath sounds normal. No wheezes, rales or rhonchi.  GI: Soft. Bowel sounds are normal. The abdomen is soft and nontender. There is no rebound and no  guarding.  Musculoskeletal: Normal range of motion. Extremities are nontender.  Skin: Skin is warm and dry. No rash noted. No diaphoresis. No erythema. No pallor. No clubbing, cyanosis, or edema.  Psychiatric: Normal mood and affect. Behavior is normal. Judgment and thought content normal.  LABORATORY RESULTS:  Available labs are reviewed  Lab Results   Component  Value  Date    CEA  1.4  05/03/2014   RADIOLOGY RESULTS:  Images and reports are reviewed.  CT abdomen and pelvis IMPRESSION:  1. Focal area of eccentric irregular wall thickening in the cecum,  compatible with the known neoplasm. Small adjacent lymph nodes in  the ileocolic mesentery raise concern for metastatic involvement.  2. 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  ASSESSMENT AND PLAN:  Carrie Page is a 80 y.o. F who was found to have a cecal cancer on diagnostic colonoscopy. She has no signs of distal metastatic disease. She does have an enlarged lymph node around the ileocolic artery. I have recommended laparoscopic resection. We discussed the risks of surgery, which are bleeding, damage to adjacent structures, infection. In her she is at a slight increased risk of injury to perihepatic structures due to her h/o open cholecystectomy. All questions were answered.  The surgery and anatomy were described to the patient as well as the risks of surgery and the possible complications. These include: Bleeding, deep abdominal infections and possible wound complications such as hernia and infection, damage to adjacent structures, leak of surgical connections, which can lead to other surgeries and possibly an ostomy, possible need for other procedures, such as abscess drains in radiology, possible prolonged hospital stay, possible diarrhea from removal of part of the colon, possible constipation from narcotics, prolonged fatigue/weakness or appetite loss, possible early recurrence of of disease, possible complications of  their medical problems such as heart disease or arrhythmias or lung problems, death (less than 1%). I believe the patient understands and wishes to proceed with the surgery.

## 2014-06-21 ENCOUNTER — Encounter (HOSPITAL_COMMUNITY): Payer: Self-pay | Admitting: General Surgery

## 2014-06-21 LAB — BASIC METABOLIC PANEL
Anion gap: 7 (ref 5–15)
BUN: 7 mg/dL (ref 6–23)
CO2: 28 mEq/L (ref 19–32)
Calcium: 8.7 mg/dL (ref 8.4–10.5)
Chloride: 97 mEq/L (ref 96–112)
Creatinine, Ser: 0.61 mg/dL (ref 0.50–1.10)
GFR calc Af Amer: >90 mL/min (ref >90)
GFR calc non Af Amer: 82 mL/min — ABNORMAL LOW (ref >90)
GLUCOSE: 147 mg/dL — AB (ref 70–99)
POTASSIUM: 3.7 meq/L (ref 3.7–5.3)
SODIUM: 132 meq/L — AB (ref 137–147)

## 2014-06-21 LAB — CBC
HCT: 28.6 % — ABNORMAL LOW (ref 36.0–46.0)
Hemoglobin: 9.2 g/dL — ABNORMAL LOW (ref 12.0–15.0)
MCH: 27.2 pg (ref 26.0–34.0)
MCHC: 32.2 g/dL (ref 30.0–36.0)
MCV: 84.6 fL (ref 78.0–100.0)
Platelets: 231 10*3/uL (ref 150–400)
RBC: 3.38 MIL/uL — AB (ref 3.87–5.11)
RDW: 14.5 % (ref 11.5–15.5)
WBC: 8 10*3/uL (ref 4.0–10.5)

## 2014-06-21 MED ORDER — ACETAMINOPHEN 325 MG PO TABS: 650.0000 mg | ORAL_TABLET | ORAL | Status: AC | PRN

## 2014-06-21 NOTE — Progress Notes (Signed)
1 Day Post-Op Lap R colectomy Subjective: Doing well.  No nausea.  Some belching.  Minimal pain.    Objective: Vital signs in last 24 hours: Temp:  [97 F (36.1 C)-100.7 F (38.2 C)] 97.9 F (36.6 C) (07/14 0540) Pulse Rate:  [66-92] 91 (07/14 0540) Resp:  [13-18] 18 (07/14 0540) BP: (138-196)/(51-78) 170/68 mmHg (07/14 0540) SpO2:  [97 %-100 %] 97 % (07/14 0540)   Intake/Output from previous day: 07/13 0701 - 07/14 0700 In: 2450 [I.V.:2450] Out: 2350 [Urine:2300; Blood:50] Intake/Output this shift:     General appearance: alert and cooperative GI: normal findings: soft, mildly distended  Incision: slight drainage on the dressing  Lab Results:   Recent Labs  06/21/14 0455  WBC 8.0  HGB 9.2*  HCT 28.6*  PLT 231   BMET  Recent Labs  06/21/14 0455  NA 132*  K 3.7  CL 97  CO2 28  GLUCOSE 147*  BUN 7  CREATININE 0.61  CALCIUM 8.7   PT/INR No results found for this basename: LABPROT, INR,  in the last 72 hours ABG No results found for this basename: PHART, PCO2, PO2, HCO3,  in the last 72 hours  MEDS, Scheduled . acetaminophen  1,000 mg Oral Q6H  . alvimopan  12 mg Oral BID  . enoxaparin (LOVENOX) injection  40 mg Subcutaneous Q24H  . irbesartan  300 mg Oral q morning - 10a    Studies/Results: No results found.  Assessment: s/p Procedure(s): LAPAROSCOPIC ASSISTED RIGHT PARTIAL COLECTOMY Patient Active Problem List   Diagnosis Date Noted  . Colon cancer 06/20/2014  . Cecal cancer 05/17/2014  . Iron deficiency anemia, unspecified 04/05/2014  . Hyperlipidemia 02/27/2012  . Encounter for long-term (current) use of high-risk medication 02/27/2012  . Allergic rhinitis, cause unspecified 01/28/2012  . Degenerative joint disease 01/28/2012  . Anxiety 01/28/2012  . Left shoulder pain 01/28/2012  . Hypertension   . Depression   . Lumbar disc disease 01/26/2012  . Preventative health care 01/26/2012  . Impaired glucose tolerance     Expected  post op course  Plan: d/c foley Advance diet to clears Minimize IVF's Ambulate   LOS: 1 day     .Rosario Adie, [facility], [place]   06/21/2014 8:23 AM

## 2014-06-21 NOTE — Evaluation (Signed)
Physical Therapy Evaluation Patient Details Name: Carrie Page MRN: [id number] DOB: 11-11-1928 Today's Date: 06/21/2014   History of Present Illness  80 yo female s/p lap R colectomy 7/13.   Clinical Impression  On eval, pt required Min assist for mobility-able to ambulate ~500 feet while holding onto IV pole. LOB x1 during session. Anticipate pt should progress well during stay. Encouraged pt to walk at least 3x/day in hallway with therapy or nursing supervision. Pt declines use of assistive devices.     Follow Up Recommendations Home health PT;No PT follow up (depending on progress)    Equipment Recommendations  None recommended by PT    Recommendations for Other Services OT consult     Precautions / Restrictions Precautions Precautions: Fall Restrictions Weight Bearing Restrictions: No      Mobility  Bed Mobility Overal bed mobility: Modified Independent                Transfers Overall transfer level: Needs assistance   Transfers: Sit to/from Stand Sit to Stand: Supervision            Ambulation/Gait Ambulation/Gait assistance: Min assist Ambulation Distance (Feet): 500 Feet Assistive device: 1 person hand held assist (Held onto IV pole) Gait Pattern/deviations: Decreased stride length     General Gait Details: LOB x1 during 1st 25 feet of ambulation distance. Min guard assist for remainder of walk. slow gait speed.   Stairs            Wheelchair Mobility    Modified Rankin (Stroke Patients Only)       Balance Overall balance assessment: Needs assistance         Standing balance support: During functional activity Standing balance-Leahy Scale: Fair                               Pertinent Vitals/Pain Pt denies pain    Home Living Family/patient expects to be discharged to:: Private residence Living Arrangements: Alone   Type of Home: House Home Access: Stairs to enter   Technical brewer of Steps:  2 Home Layout: One level Home Equipment: None      Prior Function Level of Independence: Independent               Hand Dominance        Extremity/Trunk Assessment   Upper Extremity Assessment: Overall WFL for tasks assessed           Lower Extremity Assessment: Generalized weakness      Cervical / Trunk Assessment: Normal  Communication   Communication: No difficulties  Cognition Arousal/Alertness: Awake/alert Behavior During Therapy: WFL for tasks assessed/performed Overall Cognitive Status: Within Functional Limits for tasks assessed                      General Comments      Exercises        Assessment/Plan    PT Assessment Patient needs continued PT services  PT Diagnosis Difficulty walking;Generalized weakness   PT Problem List Decreased strength;Decreased activity tolerance;Decreased balance;Decreased mobility  PT Treatment Interventions Gait training;Functional mobility training;Therapeutic activities;Therapeutic exercise;Patient/family education;Balance training;Stair training   PT Goals (Current goals can be found in the Care Plan section) Acute Rehab PT Goals Patient Stated Goal: home. return to PLOF PT Goal Formulation: With patient Time For Goal Achievement: 07/05/14 Potential to Achieve Goals: Good    Frequency Min 3X/week   Barriers to discharge  Co-evaluation               End of Session   Activity Tolerance: Patient tolerated treatment well Patient left: in chair;with call bell/phone within reach           Time: 1415-1428 PT Time Calculation (min): 13 min   Charges:   PT Evaluation $Initial PT Evaluation Tier I: 1 Procedure PT Treatments $Gait Training: 8-22 mins   PT G Codes:          Weston Anna, MPT Pager: [phone]

## 2014-06-22 LAB — BASIC METABOLIC PANEL
ANION GAP: 11 (ref 5–15)
BUN: 4 mg/dL — ABNORMAL LOW (ref 6–23)
CHLORIDE: 98 meq/L (ref 96–112)
CO2: 27 meq/L (ref 19–32)
Calcium: 9 mg/dL (ref 8.4–10.5)
Creatinine, Ser: 0.56 mg/dL (ref 0.50–1.10)
GFR calc Af Amer: >90 mL/min (ref >90)
GFR calc non Af Amer: 84 mL/min — ABNORMAL LOW (ref >90)
Glucose, Bld: 128 mg/dL — ABNORMAL HIGH (ref 70–99)
Potassium: 3.6 mEq/L — ABNORMAL LOW (ref 3.7–5.3)
SODIUM: 136 meq/L — AB (ref 137–147)

## 2014-06-22 LAB — CBC
HEMATOCRIT: 29.7 % — AB (ref 36.0–46.0)
Hemoglobin: 9.6 g/dL — ABNORMAL LOW (ref 12.0–15.0)
MCH: 27.1 pg (ref 26.0–34.0)
MCHC: 32.3 g/dL (ref 30.0–36.0)
MCV: 83.9 fL (ref 78.0–100.0)
Platelets: 236 10*3/uL (ref 150–400)
RBC: 3.54 MIL/uL — AB (ref 3.87–5.11)
RDW: 14.4 % (ref 11.5–15.5)
WBC: 6.2 10*3/uL (ref 4.0–10.5)

## 2014-06-22 NOTE — Progress Notes (Signed)
2 Days Post-Op Lap R colectomy Subjective: Doing well.  Mild nausea after eating once yesterday.  No flatus.  Some belching.  Minimal pain.    Objective: Vital signs in last 24 hours: Temp:  [99 F (37.2 C)-100.2 F (37.9 C)] 99.3 F (37.4 C) (07/15 0620) Pulse Rate:  [88-102] 92 (07/15 0620) Resp:  [16-18] 18 (07/15 0620) BP: (153-166)/(64-78) 166/67 mmHg (07/15 0620) SpO2:  [98 %-99 %] 98 % (07/15 0620)   Intake/Output from previous day: 07/14 0701 - 07/15 0700 In: 1870.8 [P.O.:1080; I.V.:790.8] Out: 4750 [Urine:4750] Intake/Output this shift:   General appearance: alert and cooperative GI: normal findings: soft, distended  Incision: slight drainage on the dressing  Lab Results:   Recent Labs  06/21/14 0455 06/22/14 0529  WBC 8.0 6.2  HGB 9.2* 9.6*  HCT 28.6* 29.7*  PLT 231 236   BMET  Recent Labs  06/21/14 0455 06/22/14 0529  NA 132* 136*  K 3.7 3.6*  CL 97 98  CO2 28 27  GLUCOSE 147* 128*  BUN 7 4*  CREATININE 0.61 0.56  CALCIUM 8.7 9.0   PT/INR No results found for this basename: LABPROT, INR,  in the last 72 hours ABG No results found for this basename: PHART, PCO2, PO2, HCO3,  in the last 72 hours  MEDS, Scheduled . alvimopan  12 mg Oral BID  . enoxaparin (LOVENOX) injection  40 mg Subcutaneous Q24H  . irbesartan  300 mg Oral q morning - 10a    Studies/Results: No results found.  Assessment: s/p Procedure(s): LAPAROSCOPIC ASSISTED RIGHT PARTIAL COLECTOMY Patient Active Problem List   Diagnosis Date Noted  . Colon cancer 06/20/2014  . Cecal cancer 05/17/2014  . Iron deficiency anemia, unspecified 04/05/2014  . Hyperlipidemia 02/27/2012  . Encounter for long-term (current) use of high-risk medication 02/27/2012  . Allergic rhinitis, cause unspecified 01/28/2012  . Degenerative joint disease 01/28/2012  . Anxiety 01/28/2012  . Left shoulder pain 01/28/2012  . Hypertension   . Depression   . Lumbar disc disease 01/26/2012  .  Preventative health care 01/26/2012  . Impaired glucose tolerance     Expected post op course  Plan: Cont clears, await return of bowel function Minimize IVF's Ambulate   LOS: 2 days     .Rosario Adie, [facility], [place]   06/22/2014 8:29 AM

## 2014-06-23 LAB — BASIC METABOLIC PANEL
Anion gap: 11 (ref 5–15)
BUN: 6 mg/dL (ref 6–23)
CHLORIDE: 101 meq/L (ref 96–112)
CO2: 26 mEq/L (ref 19–32)
Calcium: 9.1 mg/dL (ref 8.4–10.5)
Creatinine, Ser: 0.54 mg/dL (ref 0.50–1.10)
GFR calc Af Amer: >90 mL/min (ref >90)
GFR, EST NON AFRICAN AMERICAN: 85 mL/min — AB (ref >90)
GLUCOSE: 116 mg/dL — AB (ref 70–99)
POTASSIUM: 3.9 meq/L (ref 3.7–5.3)
SODIUM: 138 meq/L (ref 137–147)

## 2014-06-23 LAB — CBC
HCT: 31.4 % — ABNORMAL LOW (ref 36.0–46.0)
HEMOGLOBIN: 10.2 g/dL — AB (ref 12.0–15.0)
MCH: 27.1 pg (ref 26.0–34.0)
MCHC: 32.5 g/dL (ref 30.0–36.0)
MCV: 83.5 fL (ref 78.0–100.0)
PLATELETS: 252 10*3/uL (ref 150–400)
RBC: 3.76 MIL/uL — ABNORMAL LOW (ref 3.87–5.11)
RDW: 14.3 % (ref 11.5–15.5)
WBC: 5.1 10*3/uL (ref 4.0–10.5)

## 2014-06-23 NOTE — Progress Notes (Signed)
PT Cancellation/Screen Note  Patient Details Name: RAYNETTE ARRAS MRN: [id number] DOB: Jun 26, 1928   Cancelled Treatment:    Reason Eval/Treat Not Completed: PT screened, no needs identified, will sign off. Observed pt ambulating around unit (with nursing supervision, without device). No further PT needs at this time. Recommend continued daily ambulation with supervision. Will sign off. Thanks. Please reorder if mobility needs change.    Weston Anna, MPT Pager: [phone]

## 2014-06-23 NOTE — Progress Notes (Signed)
Pt had a BM and feels good.  Will transition to soft foods and d/c etereg.  D/C in am if tolerates this well.

## 2014-06-23 NOTE — Progress Notes (Signed)
3 Days Post-Op Lap R colectomy Subjective: Doing well.  Having some flatus.  Tolerating clears.  Minimal pain.    Objective: Vital signs in last 24 hours: Temp:  [98.1 F (36.7 C)-99.3 F (37.4 C)] 98.2 F (36.8 C) (07/16 0622) Pulse Rate:  [87-111] 87 (07/16 0622) Resp:  [18] 18 (07/16 0622) BP: (136-148)/(57-68) 136/68 mmHg (07/16 0622) SpO2:  [98 %-100 %] 98 % (07/16 0622)   Intake/Output from previous day: 07/15 0701 - 07/16 0700 In: 1644.7 [P.O.:1300; I.V.:344.7] Out: 2500 [Urine:2500] Intake/Output this shift:   General appearance: alert and cooperative GI: normal findings: soft, distended  Incision: slight drainage on the dressing, unchanged  Lab Results:   Recent Labs  06/22/14 0529 06/23/14 0521  WBC 6.2 5.1  HGB 9.6* 10.2*  HCT 29.7* 31.4*  PLT 236 252   BMET  Recent Labs  06/22/14 0529 06/23/14 0521  NA 136* 138  K 3.6* 3.9  CL 98 101  CO2 27 26  GLUCOSE 128* 116*  BUN 4* 6  CREATININE 0.56 0.54  CALCIUM 9.0 9.1   PT/INR No results found for this basename: LABPROT, INR,  in the last 72 hours ABG No results found for this basename: PHART, PCO2, PO2, HCO3,  in the last 72 hours  MEDS, Scheduled . alvimopan  12 mg Oral BID  . enoxaparin (LOVENOX) injection  40 mg Subcutaneous Q24H  . irbesartan  300 mg Oral q morning - 10a    Studies/Results: No results found.  Assessment: s/p Procedure(s): LAPAROSCOPIC ASSISTED RIGHT PARTIAL COLECTOMY Patient Active Problem List   Diagnosis Date Noted  . Colon cancer 06/20/2014  . Cecal cancer 05/17/2014  . Iron deficiency anemia, unspecified 04/05/2014  . Hyperlipidemia 02/27/2012  . Encounter for long-term (current) use of high-risk medication 02/27/2012  . Allergic rhinitis, cause unspecified 01/28/2012  . Degenerative joint disease 01/28/2012  . Anxiety 01/28/2012  . Left shoulder pain 01/28/2012  . Hypertension   . Depression   . Lumbar disc disease 01/26/2012  . Preventative health  care 01/26/2012  . Impaired glucose tolerance     Expected post op course  Plan: Advance to full liquids SL IV Ambulate   LOS: 3 days     .Rosario Adie, [facility], [place]   06/23/2014 7:41 AM

## 2014-06-24 NOTE — Progress Notes (Signed)
06/24/14 0915  Reviewed discharge instructions with patient. Patient verbalized understanding of discharge instructions.  Copy of discharge instructions given to patient.

## 2014-06-24 NOTE — Discharge Summary (Signed)
Patient ID: Carrie Page [id number] 80 y.o. [birth year]  06/20/2014  Discharge date and time: 06/24/14  Admitting Physician: Rosario Adie.  Discharge Physician: Rosario Adie.  Admission Diagnoses: Colon Cancer  Discharge Diagnoses: same  Operations: LAPAROSCOPIC ASSISTED RIGHT PARTIAL COLECTOMY    Discharged Condition: good    Hospital Course: The patient was admitted after surgery.  She did well.  Her foley was removed by POD 2.  Her diet was advanced to clears on POD 1.  She began to have bowel function on POD 2 and had a BM on POD 3.  She was ready for d/c by POD 4.  She was cleared by PT and will be going home with her daughter for a few weeks after surgery.  Consults: None  Significant Diagnostic Studies: labs: cbc, chemistry   Treatments: IV hydration  Disposition: Home

## 2014-06-24 NOTE — Discharge Instructions (Signed)

## 2014-06-24 NOTE — Care Management Note (Unsigned)
    Page 1 of 1   06/24/2014     12:50:39 PM CARE MANAGEMENT NOTE 06/24/2014  Patient:  Carrie Page, NO   Account Number:  [id number]  Date Initiated:  06/21/2014  Documentation initiated by:  Sunday Spillers  Subjective/Objective Assessment:   80 yo female admitted s/p colectomy. PTA lived at home alone.     Action/Plan:   Home when stable   Anticipated DC Date:  06/24/2014   Anticipated DC Plan:  [place]  CM consult      Choice offered to / List presented to:             Status of service:  Completed, signed off Medicare Important Message given?  YES (If response is "NO", the following Medicare IM given date fields will be blank) Date Medicare IM given:  06/24/2014 Medicare IM given by:  [facility] Date Additional Medicare IM given:   Additional Medicare IM given by:    Discharge Disposition:  HOME/SELF CARE  Per UR Regulation:  Reviewed for med. necessity/level of care/duration of stay  If discussed at [place] of Stay Meetings, dates discussed:    Comments:

## 2014-06-29 ENCOUNTER — Encounter (HOSPITAL_COMMUNITY): Payer: Self-pay

## 2014-07-05 ENCOUNTER — Telehealth: Payer: Self-pay | Admitting: *Deleted

## 2014-07-05 ENCOUNTER — Ambulatory Visit (INDEPENDENT_AMBULATORY_CARE_PROVIDER_SITE_OTHER): Payer: Medicare Other | Admitting: General Surgery

## 2014-07-05 ENCOUNTER — Encounter (INDEPENDENT_AMBULATORY_CARE_PROVIDER_SITE_OTHER): Payer: Self-pay | Admitting: General Surgery

## 2014-07-05 VITALS — BP 154/76 | HR 64 | Temp 98.1°F | Resp 14 | Ht 64.0 in | Wt 122.0 lb

## 2014-07-05 DIAGNOSIS — Z9889 Other specified postprocedural states: Secondary | ICD-10-CM

## 2014-07-05 NOTE — Patient Instructions (Signed)
Return to the office in 3 months 

## 2014-07-05 NOTE — Progress Notes (Signed)
Carrie Page is a 80 y.o. female who is status post a lap R hemicolectomy on 06/20/14.  She is doing better.  She is tolerating her diet and having regular BM's.  She denies any diarrhea.  She is not having any pain.    Objective: Filed Vitals:   07/05/14 1221  BP: 154/76  Pulse: 64  Temp: 98.1 F (36.7 C)  Resp: 14    General appearance: alert and cooperative GI: normal findings: soft, non-tender  Incision: healing well  Colon, segmental resection for tumor, right - INVASIVE ADENOCARCINOMA, INVADING THROUGH THE MUSCULARIS PROPRIA INTO PERICOLONIC FATTY TISSUE, GROSSLY PERFORATING INTO ADJACENT TERMINAL ILEAL LUMEN. - ONE OF TWENTY-ONE LYMPH NODES, POSITIVE FOR METASTATIC CARCINOMA (1/21). - RESECTION MARGINS, NEGATIVE FOR ATYPIA OR MALIGNANCY. [id number] Assessment: s/p  Patient Active Problem List   Diagnosis Date Noted  . Colon cancer 06/20/2014  . Iron deficiency anemia, unspecified 04/05/2014  . Hyperlipidemia 02/27/2012  . Encounter for long-term (current) use of high-risk medication 02/27/2012  . Allergic rhinitis, cause unspecified 01/28/2012  . Degenerative joint disease 01/28/2012  . Anxiety 01/28/2012  . Left shoulder pain 01/28/2012  . Hypertension   . Depression   . Lumbar disc disease 01/26/2012  . Preventative health care 01/26/2012  . Impaired glucose tolerance     Plan: Patient to see Dr Benay Spice for evaluation for adjuvant treatment.  Return to my office in 3 months or as needed.     Rosario Adie, [facility], [place] [phone]   07/05/2014 12:33 PM

## 2014-07-05 NOTE — Telephone Encounter (Signed)
Spoke with patient and confirmed appointment for 07/13/14.  Contact names, numbers, and directions were provided.

## 2014-07-13 ENCOUNTER — Other Ambulatory Visit: Payer: Self-pay | Admitting: *Deleted

## 2014-07-13 ENCOUNTER — Ambulatory Visit: Payer: Medicare Other

## 2014-07-13 ENCOUNTER — Ambulatory Visit (HOSPITAL_BASED_OUTPATIENT_CLINIC_OR_DEPARTMENT_OTHER): Payer: Medicare Other | Admitting: Oncology

## 2014-07-13 ENCOUNTER — Encounter: Payer: Self-pay | Admitting: Oncology

## 2014-07-13 ENCOUNTER — Telehealth: Payer: Self-pay | Admitting: Oncology

## 2014-07-13 VITALS — BP 162/70 | HR 75 | Temp 98.4°F | Resp 18 | Ht 64.0 in | Wt 121.8 lb

## 2014-07-13 DIAGNOSIS — Z862 Personal history of diseases of the blood and blood-forming organs and certain disorders involving the immune mechanism: Secondary | ICD-10-CM

## 2014-07-13 DIAGNOSIS — C180 Malignant neoplasm of cecum: Secondary | ICD-10-CM

## 2014-07-13 DIAGNOSIS — C189 Malignant neoplasm of colon, unspecified: Secondary | ICD-10-CM

## 2014-07-13 MED ORDER — CAPECITABINE 500 MG PO TABS: ORAL_TABLET | ORAL | Status: DC

## 2014-07-13 NOTE — Progress Notes (Signed)
Carrie Page Consult   Referring MD: Wylodean Shimmel 80 y.o.  15-Jul-1928    Reason for Referral: Colon cancer   HPI: She was referred to Dr. Deatra Ina with a history of anemia and blood in the stool. She was taken to a colonoscopy 05/03/2014. A mass was noted at the cecum. Multiple biopsies were obtained. The mucosa was otherwise normal. The pathology revealed adenocarcinoma.  CTs of the chest, abdomen, and pelvis on 05/06/2014 revealed no evidence of metastatic disease to the chest. No focal liver abnormality. Irregular wall thickening in the cecum extending into the region of the ileocecal valve. A 1 x 1.4 cm lymph node was seen in the ileocolic mesentery with other smaller ileocolic nodes.  She was referred to Dr. Marcello Moores and was taken to the operating room on 06/21/1999 1540 laparoscopic-assisted right colectomy. There was no evidence of metastatic disease on the liver or peritoneum. An anastomosis was created between the terminal ileum and transverse colon.  The pathology [id number]) revealed moderately differentiated adenocarcinoma invading through the muscular propria into the pericolonic fatty tissue and grossly perforating into adjacent terminal ileum. One of 20 lymph nodes was positive for metastatic carcinoma. The resection margins were negative. Lymphovascular invasion was present. A prominent Crohn's like reaction was noted at the periphery of the tumor. There was no loss of mismatch repair protein expression. The tumor returned microsatellite stable.  She has recovered from surgery.  Past Medical History  Diagnosis Date  . Impaired glucose tolerance   . Arthritis   .  G5 P3, 2 miscarriages    . Allergic rhinitis, cause unspecified 01/28/2012  . Degenerative joint disease 01/28/2012  . Hypertension   . Depression   . Anxiety 01/28/2012  . Anemia   .    Marland Kitchen Osteoporosis     RT SHOULDER  . History of nonmelanoma skin cancer   .  Cecal cancer (T4b,N1)   06/20/2014     Past Surgical History  Procedure Laterality Date  . Abdominal hysterectomy    . Appendectomy    . Tonsillectomy    . Cholecystectomy    . Back surgery  2010    MICRODCISCECTOMY  . Laparoscopic partial colectomy N/A 06/20/2014    Procedure: LAPAROSCOPIC ASSISTED RIGHT PARTIAL COLECTOMY;  Surgeon: Leighton Ruff, MD;  Location: [facility];  Service: General;  Laterality: N/A;    Medications: Reviewed  Allergies:  Allergies  Allergen Reactions  . Lovastatin Other (See Comments)    Cognitive slowing and myalgias  . Nitrofurantoin Other (See Comments)    GI upset  . Penicillins   . Pravastatin Other (See Comments)    Myalgias, increased dreams    Family history: Her father had colon cancer in his 4s and died at age 85. Her sister had "leukemia ". She had 2 brothers and one sister. No other family history of cancer.  Social History:   She lives alone in [place]. She worked in a [employer]. She does not use tobacco or alcohol. She was transfused following childbirth. No risk factor for HIV or hepatitis.  ROS:   Positives include: 10 pound weight loss following surgery, right lower quadrant pain prior to surgery, hot flashes, dark stools prior to surgery  A complete ROS was otherwise negative.  Physical Exam:  Blood pressure 162/70, pulse 75, temperature 98.4 F (36.9 C), temperature source Oral, resp. rate 18, height _0  (1.626 m), weight 121 lb 12.8 oz (55.248 kg), SpO2 100.00%.  HEENT: Oropharynx without visible mass, neck without mass Lungs: Clear bilaterally Cardiac: Regular rate and rhythm Abdomen: Healed surgical incisions, no hepatosplenomegaly, nontender, no mass  Vascular: No leg edema Lymph nodes: No cervical, supraclavicular, axillary, or inguinal nodes Neurologic: Alert and oriented, the motor exam appears intact in the upper and lower extremities Skin: Multiple benign appearing moles over the trunk and  extremities. No rash. Musculoskeletal: No spine tenderness   LAB:  CBC  Lab Results  Component Value Date   WBC 5.1 06/23/2014   HGB 10.2* 06/23/2014   HCT 31.4* 06/23/2014   MCV 83.5 06/23/2014   PLT 252 06/23/2014   NEUTROABS 3.0 05/03/2014     CMP      Component Value Date/Time   NA 138 06/23/2014 0521   K 3.9 06/23/2014 0521   CL 101 06/23/2014 0521   CO2 26 06/23/2014 0521   GLUCOSE 116* 06/23/2014 0521   BUN 6 06/23/2014 0521   CREATININE 0.54 06/23/2014 0521   CALCIUM 9.1 06/23/2014 0521   PROT 6.6 05/03/2014 1535   ALBUMIN 3.7 05/03/2014 1535   AST 23 05/03/2014 1535   ALT 15 05/03/2014 1535   ALKPHOS 80 05/03/2014 1535   BILITOT 0.6 05/03/2014 1535   GFRNONAA 85* 06/23/2014 0521   GFRAA >90 06/23/2014 0521    Lab Results  Component Value Date   CEA 1.4 05/03/2014    Imaging:  As per history of present illness, I reviewed the 05/06/2014 CT   Assessment/Plan:   1. Stage IIIc (T4b, N1) moderately differentiated adenocarcinoma of the cecum, status post a right colectomy  Tumor directly invaded the terminal ileum with involvement of the lumen  The tumor return microsatellite stable with no loss of mismatch repair protein expression   2. History of iron deficiency anemia    Disposition:   Carrie Page has been diagnosed with stage III colon cancer. I discussed the prognosis and reviewed the details of the surgical pathology report with Carrie Page and her daughter. She has a significant chance of developing recurrent colon cancer over the next few years. We discussed the benefit associated with adjuvant 5-fluorouracil based chemotherapy. I recommend adjuvant capecitabine.  I reviewed the potential toxicities associated with capecitabine including the chance for nausea, mucositis, alopecia, diarrhea, and hematologic toxicity. We discussed the rash, hyperpigmentation, and hand/foot syndrome associated with capecitabine. She agrees to proceed. Carrie Page will attend a  chemotherapy teaching class.  She does not appear to have  hereditary non-polyposis colon cancer syndrome. However her relatives remain at increased risk of developing colon cancer. She will be sure they receive appropriate screening.  The plan is to begin a first cycle of adjuvant capecitabine on 07/18/2014. She will return for an office and lab visit 08/04/2014.  Approximately 50 minutes were spent with Page today. The majority of the time was used for counseling and coordination of care.  [place], [place] 07/13/2014, 1:39 PM

## 2014-07-13 NOTE — Telephone Encounter (Signed)
Handwritten Rx given to managed care for Xeloda to begin 07/18/14.

## 2014-07-13 NOTE — Telephone Encounter (Signed)
gv and rpinted aptp sched and avs for pt for Aug

## 2014-07-13 NOTE — Progress Notes (Signed)
Met with Carrie Page and family. Explained role of nurse navigator. Educational information provided on colon cancer  Referral made to dietician for diet education. [place] resources provided to patient, including SW service and support group information.  Contact names and phone numbers were provided for entire [facility] team.  Teach back method was used.   Will continue to follow as needed.

## 2014-07-13 NOTE — Progress Notes (Signed)
Checked in new pt with no financial concerns. °

## 2014-07-14 ENCOUNTER — Encounter: Payer: Self-pay | Admitting: *Deleted

## 2014-07-14 ENCOUNTER — Other Ambulatory Visit: Payer: Medicare Other

## 2014-07-18 ENCOUNTER — Encounter: Payer: Self-pay | Admitting: Oncology

## 2014-07-18 NOTE — Progress Notes (Signed)
Received letter from [employer].  Pt is approved for Xeloda from 07/14/14 to 07/14/15 or when the benefit cap has been met.  Expenses can be submitted for reimbursement for dos 04/15/14 to 07/14/15.  The amount of the grant is $7500.

## 2014-08-03 ENCOUNTER — Telehealth: Payer: Self-pay | Admitting: Nurse Practitioner

## 2014-08-03 NOTE — Telephone Encounter (Signed)
Spk w/pt advising of r/s for Ernestene Kiel Nut class due to Pamala Hurry is out sick...Marland KitchenMarland KitchenKJ

## 2014-08-04 ENCOUNTER — Ambulatory Visit (HOSPITAL_BASED_OUTPATIENT_CLINIC_OR_DEPARTMENT_OTHER): Payer: Medicare Other | Admitting: Nurse Practitioner

## 2014-08-04 ENCOUNTER — Telehealth: Payer: Self-pay | Admitting: Oncology

## 2014-08-04 ENCOUNTER — Other Ambulatory Visit: Payer: Self-pay

## 2014-08-04 ENCOUNTER — Other Ambulatory Visit: Payer: Medicare Other

## 2014-08-04 ENCOUNTER — Other Ambulatory Visit (HOSPITAL_BASED_OUTPATIENT_CLINIC_OR_DEPARTMENT_OTHER): Payer: Medicare Other

## 2014-08-04 ENCOUNTER — Ambulatory Visit: Payer: Medicare Other | Admitting: Nurse Practitioner

## 2014-08-04 ENCOUNTER — Encounter: Payer: Medicare Other | Admitting: Nutrition

## 2014-08-04 VITALS — BP 190/69 | HR 79 | Temp 98.6°F | Resp 18 | Ht 64.0 in | Wt 121.6 lb

## 2014-08-04 DIAGNOSIS — C189 Malignant neoplasm of colon, unspecified: Secondary | ICD-10-CM

## 2014-08-04 DIAGNOSIS — Z862 Personal history of diseases of the blood and blood-forming organs and certain disorders involving the immune mechanism: Secondary | ICD-10-CM

## 2014-08-04 DIAGNOSIS — C180 Malignant neoplasm of cecum: Secondary | ICD-10-CM

## 2014-08-04 LAB — CBC WITH DIFFERENTIAL/PLATELET
BASO%: 1.1 % (ref 0.0–2.0)
Basophils Absolute: 0.1 10*3/uL (ref 0.0–0.1)
EOS%: 2.1 % (ref 0.0–7.0)
Eosinophils Absolute: 0.1 10*3/uL (ref 0.0–0.5)
HCT: 34.9 % (ref 34.8–46.6)
HEMOGLOBIN: 11.1 g/dL — AB (ref 11.6–15.9)
LYMPH#: 2.6 10*3/uL (ref 0.9–3.3)
LYMPH%: 46.7 % (ref 14.0–49.7)
MCH: 26.2 pg (ref 25.1–34.0)
MCHC: 31.8 g/dL (ref 31.5–36.0)
MCV: 82.3 fL (ref 79.5–101.0)
MONO#: 0.4 10*3/uL (ref 0.1–0.9)
MONO%: 6.6 % (ref 0.0–14.0)
NEUT#: 2.4 10*3/uL (ref 1.5–6.5)
NEUT%: 43.5 % (ref 38.4–76.8)
PLATELETS: 268 10*3/uL (ref 145–400)
RBC: 4.24 10*6/uL (ref 3.70–5.45)
RDW: 15.8 % — ABNORMAL HIGH (ref 11.2–14.5)
WBC: 5.6 10*3/uL (ref 3.9–10.3)

## 2014-08-04 LAB — COMPREHENSIVE METABOLIC PANEL (CC13)
ALT: 13 U/L (ref 0–55)
ANION GAP: 7 meq/L (ref 3–11)
AST: 18 U/L (ref 5–34)
Albumin: 3.7 g/dL (ref 3.5–5.0)
Alkaline Phosphatase: 81 U/L (ref 40–150)
BUN: 20.4 mg/dL (ref 7.0–26.0)
CALCIUM: 9.3 mg/dL (ref 8.4–10.4)
CO2: 27 meq/L (ref 22–29)
CREATININE: 0.7 mg/dL (ref 0.6–1.1)
Chloride: 105 mEq/L (ref 98–109)
Glucose: 116 mg/dl (ref 70–140)
Potassium: 3.9 mEq/L (ref 3.5–5.1)
Sodium: 138 mEq/L (ref 136–145)
Total Bilirubin: 0.51 mg/dL (ref 0.20–1.20)
Total Protein: 6.8 g/dL (ref 6.4–8.3)

## 2014-08-04 MED ORDER — CAPECITABINE 500 MG PO TABS: ORAL_TABLET | ORAL | Status: DC

## 2014-08-04 NOTE — Progress Notes (Signed)
  [facility] OFFICE PROGRESS NOTE   Diagnosis:  Colon cancer.  INTERVAL HISTORY:   Carrie Page returns as scheduled. She completed cycle 1 adjuvant Xeloda beginning 07/18/2014. She denies nausea/vomiting. No mouth sores. No frank diarrhea. She has noted that her stools are somewhat looser over the past few days however. No hand or foot pain or redness. She has noted darkening of some pre-existing moles. She has a mild red rash over her nose. She has been applying lotion with improvement.  Objective:  Vital signs in last 24 hours:  Blood pressure 190/69, pulse 79, temperature 98.6 F (37 C), temperature source Oral, resp. rate 18, height $RemoveBe'5\' 4"'gQYGymiXd$  (1.626 m), weight 121 lb 9.6 oz (55.157 kg).    HEENT: No thrush or ulcers. Lymphatics: No palpable cervical, supraclavicular lymph nodes. Resp: Lungs clear bilaterally. Cardio: Regular rate and rhythm. GI: Abdomen soft and nontender. No hepatomegaly. No mass. Vascular: No leg edema.  Skin: Palms without erythema.    Lab Results:  Lab Results  Component Value Date   WBC 5.6 08/04/2014   HGB 11.1* 08/04/2014   HCT 34.9 08/04/2014   MCV 82.3 08/04/2014   PLT 268 08/04/2014   NEUTROABS 2.4 08/04/2014    Imaging:  No results found.  Medications: I have reviewed the patient's current medications.  Assessment/Plan: 1. Stage IIIc (T4b, N1) moderately differentiated adenocarcinoma of the cecum, status post a right colectomy Tumor directly invaded the terminal ileum with involvement of the lumen  The tumor return microsatellite stable with no loss of mismatch repair protein expression Cycle 1 adjuvant Xeloda 07/18/2014. 2. History of iron deficiency anemia.   Disposition: Carrie Page appears stable. She has completed one cycle of adjuvant Xeloda. Plan to proceed with cycle 2 beginning 08/08/2014. She will return for a followup visit on 08/22/2014. She will contact the office in the interim with any problems.    Ned Card ANP/GNP-BC   08/04/2014  3:30 PM

## 2014-08-04 NOTE — Telephone Encounter (Signed)
gv adn pritned aptp sched and avs for pt fro Aug thru OCT

## 2014-08-08 ENCOUNTER — Ambulatory Visit: Payer: Medicare Other | Admitting: Nutrition

## 2014-08-08 NOTE — Progress Notes (Signed)
80 year old female diagnosed with colon cancer.  She is a patient of Dr. Benay Spice.  Past medical history includes impaired glucose tolerance, arthritis, degenerative joint disease, hypertension, depression, anxiety, anemia, osteoporosis, melanoma.  Medications include vitamin D, ferrous sulfate, multivitamin, and Xeloda.  Labs include glucose 116.  On July 16.  Height: 64 inches. Weight: 121.6 pounds August 27. Usual body weight: 130 pounds May 2015. BMI: 20.86.  Patient reports she is eating well.  Her weight has been stable for 1 month, although it is down approximately 9 pounds from usual body weight.  Patient denies nausea, vomiting, mouth sores or diarrhea.  She does have dry mouth.  She does complain of fatigue.  She occasionally will drink Ensure Plus.  Nutrition diagnosis: Unintended weight loss related to diagnosis of colon cancer as evidenced by 9 pound weight loss from usual body weight.  Intervention: Patient educated to consume smaller, more frequent meals and snacks with high protein foods. Fact sheet provided. Educated patient on strategies for eating with dry mouth. Fact sheet provided. Provided education on strategies for eating if she develops diarrhea.  Fact sheets provided. Recommended patient continue Ensure Plus once to twice daily. Questions were answered.  Teach back method was used.  Contact information was given.  Monitoring, evaluation, goals: Patient will tolerate adequate calories and protein to prevent weight loss.  Next visit: Patient will contact me for questions or concerns.   **Disclaimer: This note was dictated with voice recognition software. Similar sounding words can inadvertently be transcribed and this note may contain transcription errors which may not have been corrected upon publication of note.**

## 2014-08-22 ENCOUNTER — Telehealth: Payer: Self-pay | Admitting: Oncology

## 2014-08-22 ENCOUNTER — Ambulatory Visit (HOSPITAL_BASED_OUTPATIENT_CLINIC_OR_DEPARTMENT_OTHER): Payer: Medicare Other | Admitting: Nurse Practitioner

## 2014-08-22 ENCOUNTER — Other Ambulatory Visit (HOSPITAL_BASED_OUTPATIENT_CLINIC_OR_DEPARTMENT_OTHER): Payer: Medicare Other

## 2014-08-22 VITALS — BP 181/66 | HR 76 | Temp 98.1°F | Resp 18 | Ht 64.0 in | Wt 122.4 lb

## 2014-08-22 DIAGNOSIS — C180 Malignant neoplasm of cecum: Secondary | ICD-10-CM

## 2014-08-22 DIAGNOSIS — C189 Malignant neoplasm of colon, unspecified: Secondary | ICD-10-CM

## 2014-08-22 DIAGNOSIS — Z862 Personal history of diseases of the blood and blood-forming organs and certain disorders involving the immune mechanism: Secondary | ICD-10-CM

## 2014-08-22 LAB — CBC WITH DIFFERENTIAL/PLATELET
BASO%: 1.3 % (ref 0.0–2.0)
Basophils Absolute: 0.1 10*3/uL (ref 0.0–0.1)
EOS ABS: 0.1 10*3/uL (ref 0.0–0.5)
EOS%: 2.5 % (ref 0.0–7.0)
HCT: 37.3 % (ref 34.8–46.6)
HEMOGLOBIN: 11.8 g/dL (ref 11.6–15.9)
LYMPH#: 2.4 10*3/uL (ref 0.9–3.3)
LYMPH%: 43.5 % (ref 14.0–49.7)
MCH: 26.5 pg (ref 25.1–34.0)
MCHC: 31.7 g/dL (ref 31.5–36.0)
MCV: 83.6 fL (ref 79.5–101.0)
MONO#: 0.3 10*3/uL (ref 0.1–0.9)
MONO%: 6.4 % (ref 0.0–14.0)
NEUT%: 46.3 % (ref 38.4–76.8)
NEUTROS ABS: 2.5 10*3/uL (ref 1.5–6.5)
Platelets: 161 10*3/uL (ref 145–400)
RBC: 4.47 10*6/uL (ref 3.70–5.45)
RDW: 16.3 % — AB (ref 11.2–14.5)
WBC: 5.4 10*3/uL (ref 3.9–10.3)

## 2014-08-22 LAB — COMPREHENSIVE METABOLIC PANEL (CC13)
ALT: 12 U/L (ref 0–55)
AST: 19 U/L (ref 5–34)
Albumin: 3.8 g/dL (ref 3.5–5.0)
Alkaline Phosphatase: 84 U/L (ref 40–150)
Anion Gap: 8 mEq/L (ref 3–11)
BUN: 20.2 mg/dL (ref 7.0–26.0)
CHLORIDE: 105 meq/L (ref 98–109)
CO2: 27 mEq/L (ref 22–29)
Calcium: 9.5 mg/dL (ref 8.4–10.4)
Creatinine: 0.7 mg/dL (ref 0.6–1.1)
GLUCOSE: 151 mg/dL — AB (ref 70–140)
Potassium: 4 mEq/L (ref 3.5–5.1)
Sodium: 140 mEq/L (ref 136–145)
Total Bilirubin: 0.94 mg/dL (ref 0.20–1.20)
Total Protein: 7.1 g/dL (ref 6.4–8.3)

## 2014-08-22 MED ORDER — CAPECITABINE 500 MG PO TABS
ORAL_TABLET | ORAL | Status: DC
Start: 2014-08-22 — End: 2014-09-12

## 2014-08-22 NOTE — Progress Notes (Signed)
  [facility] OFFICE PROGRESS NOTE   Diagnosis:  Colon cancer  INTERVAL HISTORY:   Ms. Branscomb returns as scheduled. She completed cycle 2 adjuvant Xeloda beginning 08/08/2014. She denies nausea/vomiting. No mouth sores. No significant diarrhea. She continues to note darkening of pre-existing skin lesions. No hand or foot pain or redness. She has a good appetite. She denies pain.  Objective:  Vital signs in last 24 hours:  Blood pressure 181/66, pulse 76, temperature 98.1 F (36.7 C), temperature source Oral, resp. rate 18, height 5' 4" (1.626 m), weight 122 lb 6.4 oz (55.52 kg).    HEENT: No thrush or ulcers. Resp: Lungs clear bilaterally. Cardio: Regular rate and rhythm. GI: Abdomen soft and nontender. No hepatomegaly. Vascular: No leg edema.  Skin: Palms with mild erythema. No skin breakdown. Question early callus formation at the bilateral heels.    Lab Results:  Lab Results  Component Value Date   WBC 5.4 08/22/2014   HGB 11.8 08/22/2014   HCT 37.3 08/22/2014   MCV 83.6 08/22/2014   PLT 161 08/22/2014   NEUTROABS 2.5 08/22/2014    Imaging:  No results found.  Medications: I have reviewed the patient's current medications.  Assessment/Plan: 1. 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 Cycle 2 adjuvant Xeloda 08/08/2014. 2. History of iron deficiency anemia.   Disposition: Ms. Pigman appears stable. She has completed 2 cycles of adjuvant Xeloda. She is tolerating the Xeloda well. Plan to proceed with cycle 3 as scheduled beginning 08/29/2014. She will return for a followup visit on 09/12/2014. She will contact the office in the interim with any problems.    Ned Card ANP/GNP-BC    08/22/2014  2:18 PM

## 2014-08-22 NOTE — Telephone Encounter (Signed)
gv pt appt schedule for oct. added new appt for 10/26 - appt for 10/5 remains. per pt she is to keep 10/5 appt also.

## 2014-09-12 ENCOUNTER — Telehealth: Payer: Self-pay | Admitting: Oncology

## 2014-09-12 ENCOUNTER — Other Ambulatory Visit (HOSPITAL_BASED_OUTPATIENT_CLINIC_OR_DEPARTMENT_OTHER): Payer: Medicare Other

## 2014-09-12 ENCOUNTER — Ambulatory Visit (HOSPITAL_BASED_OUTPATIENT_CLINIC_OR_DEPARTMENT_OTHER): Payer: Medicare Other | Admitting: Oncology

## 2014-09-12 VITALS — BP 166/57 | HR 77 | Temp 98.0°F | Resp 18 | Ht 64.0 in | Wt 121.4 lb

## 2014-09-12 DIAGNOSIS — C180 Malignant neoplasm of cecum: Secondary | ICD-10-CM

## 2014-09-12 DIAGNOSIS — C189 Malignant neoplasm of colon, unspecified: Secondary | ICD-10-CM

## 2014-09-12 LAB — CBC WITH DIFFERENTIAL/PLATELET
BASO%: 1.4 % (ref 0.0–2.0)
Basophils Absolute: 0.1 10*3/uL (ref 0.0–0.1)
EOS ABS: 0.2 10*3/uL (ref 0.0–0.5)
EOS%: 3 % (ref 0.0–7.0)
HCT: 38.3 % (ref 34.8–46.6)
HGB: 12.3 g/dL (ref 11.6–15.9)
LYMPH%: 43.6 % (ref 14.0–49.7)
MCH: 27.7 pg (ref 25.1–34.0)
MCHC: 32.1 g/dL (ref 31.5–36.0)
MCV: 86.3 fL (ref 79.5–101.0)
MONO#: 0.4 10*3/uL (ref 0.1–0.9)
MONO%: 7.5 % (ref 0.0–14.0)
NEUT%: 44.5 % (ref 38.4–76.8)
NEUTROS ABS: 2.2 10*3/uL (ref 1.5–6.5)
Platelets: 165 10*3/uL (ref 145–400)
RBC: 4.44 10*6/uL (ref 3.70–5.45)
RDW: 24.7 % — ABNORMAL HIGH (ref 11.2–14.5)
WBC: 5 10*3/uL (ref 3.9–10.3)
lymph#: 2.2 10*3/uL (ref 0.9–3.3)

## 2014-09-12 LAB — COMPREHENSIVE METABOLIC PANEL (CC13)
ALBUMIN: 4.1 g/dL (ref 3.5–5.0)
ALK PHOS: 101 U/L (ref 40–150)
ALT: 19 U/L (ref 0–55)
AST: 21 U/L (ref 5–34)
Anion Gap: 9 mEq/L (ref 3–11)
BUN: 17.5 mg/dL (ref 7.0–26.0)
CO2: 26 mEq/L (ref 22–29)
Calcium: 10 mg/dL (ref 8.4–10.4)
Chloride: 105 mEq/L (ref 98–109)
Creatinine: 0.7 mg/dL (ref 0.6–1.1)
Glucose: 109 mg/dl (ref 70–140)
POTASSIUM: 4.1 meq/L (ref 3.5–5.1)
Sodium: 139 mEq/L (ref 136–145)
TOTAL PROTEIN: 7.2 g/dL (ref 6.4–8.3)
Total Bilirubin: 1.16 mg/dL (ref 0.20–1.20)

## 2014-09-12 MED ORDER — CAPECITABINE 500 MG PO TABS: ORAL_TABLET | ORAL | Status: DC

## 2014-09-12 NOTE — Telephone Encounter (Signed)
Pt confirmed labs/ov per 10/05 POF, gave pt AVS..... KJ °

## 2014-09-12 NOTE — Progress Notes (Signed)
  [facility] OFFICE PROGRESS NOTE   Diagnosis: Colon cancer  INTERVAL HISTORY:   She returns as scheduled. She completed another cycle of Xeloda beginning 08/29/2014. No mouth sores, and hand/foot pain, or diarrhea.   Objective:  Vital signs in last 24 hours:  Blood pressure 166/57, pulse 77, temperature 98 F (36.7 C), temperature source Oral, resp. rate 18, height $RemoveBe'5\' 4"'toMHmASsG$  (1.626 m), weight 121 lb 6.4 oz (55.067 kg), SpO2 99.00%.    HEENT: No thrush or ulcers Resp: Lungs clear bilaterally Cardio: Regular rate and rhythm GI: No hepatomegaly, nontender Vascular: No leg edema  Skin: Mild erythema and hyperpigmentation of the palms, soles without erythema    Lab Results:  Lab Results  Component Value Date   WBC 5.0 09/12/2014   HGB 12.3 09/12/2014   HCT 38.3 09/12/2014   MCV 86.3 09/12/2014   PLT 165 09/12/2014   NEUTROABS 2.2 09/12/2014    Medications: I have reviewed the patient's current medications.  Assessment/Plan: 1. 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  Cycle 2 adjuvant Xeloda 08/08/2014. Cycle 3 adjuvant Xeloda 08/29/2014 2. History of iron deficiency anemia.   Disposition:  Ms. Dacey has completed 3 cycles of adjuvant Xeloda. She is tolerating the Xeloda well. The plan is to proceed with cycle 4 on 09/19/2014. She will return for an office visit 10/03/2014. She plans to get an influenza vaccine with her primary physician later this week.  Betsy Coder, MD  09/12/2014  12:23 PM

## 2014-09-15 ENCOUNTER — Encounter: Payer: Self-pay | Admitting: Internal Medicine

## 2014-09-15 ENCOUNTER — Ambulatory Visit (INDEPENDENT_AMBULATORY_CARE_PROVIDER_SITE_OTHER): Payer: Medicare Other | Admitting: Internal Medicine

## 2014-09-15 VITALS — BP 142/78 | HR 77 | Temp 98.2°F | Ht 64.0 in | Wt 122.5 lb

## 2014-09-15 DIAGNOSIS — I10 Essential (primary) hypertension: Secondary | ICD-10-CM

## 2014-09-15 DIAGNOSIS — E785 Hyperlipidemia, unspecified: Secondary | ICD-10-CM

## 2014-09-15 DIAGNOSIS — R7302 Impaired glucose tolerance (oral): Secondary | ICD-10-CM

## 2014-09-15 DIAGNOSIS — Z23 Encounter for immunization: Secondary | ICD-10-CM

## 2014-09-15 NOTE — Progress Notes (Signed)
Pre visit review using our clinic review tool, if applicable. No additional management support is needed unless otherwise documented below in the visit note. 

## 2014-09-15 NOTE — Progress Notes (Signed)
Subjective:    Patient ID: Carrie Page, female    DOB: 12-30-1927, 80 y.o.   MRN: [id number]  HPI  Here to f/u; overall doing ok,  Pt denies chest pain, increased sob or doe, wheezing, orthopnea, PND, increased LE swelling, palpitations, dizziness or syncope.  Pt denies polydipsia, polyuria, or low sugar symptoms such as weakness or confusion improved with po intake.  Pt denies new neurological symptoms such as new headache, or facial or extremity weakness or numbness.   Pt states overall good compliance with meds, has been trying to follow lower cholesterol diet, with wt overall stable. Completing CMT wiht Dr Ammie Dalton for colon cancer, iron def anemia. Does have the fatigue, dry skin possibly related to CMT, no sores in mouth.  Wt overall stable recently.  Pt denies fever, wt loss, night sweats, loss of appetite, or other constitutional symptoms Wt Readings from Last 3 Encounters:  09/15/14 122 lb 8 oz (55.566 kg)  09/12/14 121 lb 6.4 oz (55.067 kg)  08/22/14 122 lb 6.4 oz (55.52 kg)   Past Medical History  Diagnosis Date  . Impaired glucose tolerance   . Arthritis   . Allergy   . Allergic rhinitis, cause unspecified 01/28/2012  . Degenerative joint disease 01/28/2012  . Hypertension   . Depression   . Anxiety 01/28/2012  . Anemia   . PONV (postoperative nausea and vomiting)   . Osteoporosis     RT SHOULDER  . History of nonmelanoma skin cancer   . Cecal cancer    Past Surgical History  Procedure Laterality Date  . Abdominal hysterectomy    . Appendectomy    . Tonsillectomy    . Cholecystectomy    . Back surgery  2010    MICRODCISCECTOMY  . Laparoscopic partial colectomy N/A 06/20/2014    Procedure: LAPAROSCOPIC ASSISTED RIGHT PARTIAL COLECTOMY;  Surgeon: Leighton Ruff, MD;  Location: [facility];  Service: General;  Laterality: N/A;    reports that she has never smoked. She has never used smokeless tobacco. She reports that she does not drink alcohol or use illicit  drugs. family history includes Arthritis in her other; Heart disease in her other; Stroke in her other. Allergies  Allergen Reactions  . Lovastatin Other (See Comments)    Cognitive slowing and myalgias  . Nitrofurantoin Other (See Comments)    GI upset  . Penicillins   . Pravastatin Other (See Comments)    Myalgias, increased dreams   Current Outpatient Prescriptions on File Prior to Visit  Medication Sig Dispense Refill  . aspirin EC 81 MG tablet Take 81 mg by mouth daily.      . capecitabine (XELODA) 500 MG tablet Take 3 tabs (1,500 mg) Q AM. 2 tabs (1,000 mg) QPM PO for 14 days. Then 7 days off. Start 08/29/14  70 tablet  0  . Cholecalciferol (VITAMIN D3) 1000 UNITS CAPS Take 1 capsule by mouth daily.      . ferrous sulfate 325 (65 FE) MG tablet Take 650 mg by mouth daily with breakfast.       . irbesartan (AVAPRO) 300 MG tablet Take 300 mg by mouth every morning.      . Multiple Vitamin (MULTIVITAMIN) tablet Take 1 tablet by mouth daily.       No current facility-administered medications on file prior to visit.   Review of Systems  Constitutional: Negative for unusual diaphoresis or other sweats  HENT: Negative for ringing in ear Eyes: Negative for double vision or worsening  visual disturbance.  Respiratory: Negative for choking and stridor.   Gastrointestinal: Negative for vomiting or other signifcant bowel change Genitourinary: Negative for hematuria or decreased urine volume.  Musculoskeletal: Negative for other MSK pain or swelling Skin: Negative for color change and worsening wound.  Neurological: Negative for tremors and numbness other than noted  Psychiatric/Behavioral: Negative for decreased concentration or agitation other than above       Objective:   Physical Exam BP 142/78  Pulse 77  Temp(Src) 98.2 F (36.8 C) (Oral)  Ht 5\' 4"  (1.626 m)  Wt 122 lb 8 oz (55.566 kg)  BMI 21.02 kg/m2  SpO2 97% VS noted,  Constitutional: Pt appears well-developed,  well-nourished.  HENT: Head: NCAT.  Right Ear: External ear normal.  Left Ear: External ear normal.  Eyes: . Pupils are equal, round, and reactive to light. Conjunctivae and EOM are normal Neck: Normal range of motion. Neck supple.  Cardiovascular: Normal rate and regular rhythm.   Pulmonary/Chest: Effort normal and breath sounds normal.  Abd:  Soft, NT, ND, + BS Neurological: Pt is alert. Not confused , motor grossly intact Skin: Skin is warm. No rash Psychiatric: Pt behavior is normal. No agitation.     Assessment & Plan:

## 2014-09-15 NOTE — Patient Instructions (Signed)
You had the flu shot today  Please continue all other medications as before, and refills have been done if requested.  Please have the pharmacy call with any other refills you may need.  Please continue your efforts at being more active, low cholesterol diet, and weight control.  Please keep your appointments with your specialists as you may have planned  No further lab work needed today  Please return in 6 months, or sooner if needed

## 2014-09-18 NOTE — Assessment & Plan Note (Signed)
stable overall by history and exam, recent data reviewed with pt, and pt to continue medical treatment as before,  to f/u any worsening symptoms or concerns Lab Results  Component Value Date   HGBA1C 6.1 09/09/2013

## 2014-09-18 NOTE — Assessment & Plan Note (Signed)
stable overall by history and exam, recent data reviewed with pt, and pt to continue medical treatment as before,  to f/u any worsening symptoms or concerns BP Readings from Last 3 Encounters:  09/15/14 142/78  09/12/14 166/57  08/22/14 181/66

## 2014-09-18 NOTE — Assessment & Plan Note (Signed)
stable overall by history and exam, recent data reviewed with pt, and pt to continue medical treatment as before,  to f/u any worsening symptoms or concerns Lab Results  Component Value Date   LDLCALC 137* 03/15/2014   For lower chol diet, consider statin

## 2014-10-03 ENCOUNTER — Telehealth: Payer: Self-pay | Admitting: Nurse Practitioner

## 2014-10-03 ENCOUNTER — Other Ambulatory Visit (HOSPITAL_BASED_OUTPATIENT_CLINIC_OR_DEPARTMENT_OTHER): Payer: Medicare Other

## 2014-10-03 ENCOUNTER — Ambulatory Visit (HOSPITAL_BASED_OUTPATIENT_CLINIC_OR_DEPARTMENT_OTHER): Payer: Medicare Other | Admitting: Nurse Practitioner

## 2014-10-03 VITALS — BP 156/68 | HR 69 | Temp 97.6°F | Resp 20 | Ht 64.0 in | Wt 122.5 lb

## 2014-10-03 DIAGNOSIS — C180 Malignant neoplasm of cecum: Secondary | ICD-10-CM

## 2014-10-03 DIAGNOSIS — R208 Other disturbances of skin sensation: Secondary | ICD-10-CM

## 2014-10-03 DIAGNOSIS — C189 Malignant neoplasm of colon, unspecified: Secondary | ICD-10-CM

## 2014-10-03 LAB — CBC WITH DIFFERENTIAL/PLATELET
BASO%: 1.2 % (ref 0.0–2.0)
Basophils Absolute: 0.1 10*3/uL (ref 0.0–0.1)
EOS%: 4.5 % (ref 0.0–7.0)
Eosinophils Absolute: 0.2 10*3/uL (ref 0.0–0.5)
HCT: 36.4 % (ref 34.8–46.6)
HGB: 11.9 g/dL (ref 11.6–15.9)
LYMPH%: 43.3 % (ref 14.0–49.7)
MCH: 29 pg (ref 25.1–34.0)
MCHC: 32.7 g/dL (ref 31.5–36.0)
MCV: 88.8 fL (ref 79.5–101.0)
MONO#: 0.3 10*3/uL (ref 0.1–0.9)
MONO%: 6.9 % (ref 0.0–14.0)
NEUT#: 2.2 10*3/uL (ref 1.5–6.5)
NEUT%: 44.1 % (ref 38.4–76.8)
PLATELETS: 178 10*3/uL (ref 145–400)
RBC: 4.1 10*6/uL (ref 3.70–5.45)
RDW: 29.1 % — ABNORMAL HIGH (ref 11.2–14.5)
WBC: 4.9 10*3/uL (ref 3.9–10.3)
lymph#: 2.1 10*3/uL (ref 0.9–3.3)

## 2014-10-03 LAB — COMPREHENSIVE METABOLIC PANEL (CC13)
ALK PHOS: 105 U/L (ref 40–150)
ALT: 20 U/L (ref 0–55)
AST: 22 U/L (ref 5–34)
Albumin: 3.9 g/dL (ref 3.5–5.0)
Anion Gap: 6 mEq/L (ref 3–11)
BILIRUBIN TOTAL: 0.67 mg/dL (ref 0.20–1.20)
BUN: 18.7 mg/dL (ref 7.0–26.0)
CO2: 25 mEq/L (ref 22–29)
Calcium: 9.5 mg/dL (ref 8.4–10.4)
Chloride: 106 mEq/L (ref 98–109)
Creatinine: 0.7 mg/dL (ref 0.6–1.1)
GLUCOSE: 109 mg/dL (ref 70–140)
Potassium: 4 mEq/L (ref 3.5–5.1)
Sodium: 138 mEq/L (ref 136–145)
TOTAL PROTEIN: 6.9 g/dL (ref 6.4–8.3)

## 2014-10-03 MED ORDER — CAPECITABINE 500 MG PO TABS: ORAL_TABLET | ORAL | Status: DC

## 2014-10-03 NOTE — Progress Notes (Signed)
  [place] OFFICE PROGRESS NOTE   Diagnosis:  Colon cancer  INTERVAL HISTORY:   Carrie Page returns as scheduled. She completed cycle 4 Xeloda beginning 09/19/2014. She denies nausea/vomiting. No mouth sores. No diarrhea. No tenderness redness. She notes intermittent tenderness of the palms and soles.  Objective:  Vital signs in last 24 hours:  Blood pressure 156/68, pulse 69, temperature 97.6 F (36.4 C), temperature source Oral, resp. rate 20, height $RemoveBe'5\' 4"'czaDzcnFD$  (1.626 m), weight 122 lb 8 oz (55.566 kg).    HEENT: No thrush or ulcers. Resp: Lungs clear bilaterally. Cardio: Regular rate and rhythm. GI: Abdomen soft and nontender. No hepatomegaly. Vascular: No leg edema. Calves soft and nontender.  Skin: Palms and soles with mild erythema. No skin breakdown.    Lab Results:  Lab Results  Component Value Date   WBC 4.9 10/03/2014   HGB 11.9 10/03/2014   HCT 36.4 10/03/2014   MCV 88.8 10/03/2014   PLT 178 10/03/2014   NEUTROABS 2.2 10/03/2014    Imaging:  No results found.  Medications: I have reviewed the patient's current medications.  Assessment/Plan: 1. 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  Cycle 2 adjuvant Xeloda 08/08/2014.  Cycle 3 adjuvant Xeloda 08/29/2014. Cycle 4 adjuvant Xeloda 09/19/2014. 2. History of iron deficiency anemia. 3. Early hand-foot syndrome with mild erythema/tenderness of the palms and soles 10/03/2014.   Disposition: Carrie Page appears stable. She has completed 4 cycles of adjuvant Xeloda. Plan to proceed with cycle 5 as scheduled beginning 10/10/2014.  She has mild erythema/tenderness of the palms and soles. She understands to hold the next cycle and  contact the office if symptoms worsen.  She will return for a followup visit in 3 weeks. She will contact the office in the interim as outlined above or with any other problems.  She reports receiving the influenza vaccine at her primary physician's office.    Ned Card ANP/GNP-BC   10/03/2014  12:56 PM

## 2014-10-03 NOTE — Telephone Encounter (Signed)
Gave avs & cal for Nov/Dec. °

## 2014-10-04 ENCOUNTER — Ambulatory Visit (INDEPENDENT_AMBULATORY_CARE_PROVIDER_SITE_OTHER): Payer: Medicare Other | Admitting: General Surgery

## 2014-10-10 ENCOUNTER — Telehealth: Payer: Self-pay | Admitting: Internal Medicine

## 2014-10-10 MED ORDER — IRBESARTAN 300 MG PO TABS: 300.0000 mg | ORAL_TABLET | Freq: Every morning | ORAL | Status: DC

## 2014-10-10 NOTE — Telephone Encounter (Signed)
Patient is requesting script for avapro to be sent to [facility] on [address].

## 2014-10-10 NOTE — Telephone Encounter (Signed)
Notified pt sent to [facility]...Carrie Page

## 2014-10-24 ENCOUNTER — Ambulatory Visit (HOSPITAL_BASED_OUTPATIENT_CLINIC_OR_DEPARTMENT_OTHER): Payer: Medicare Other | Admitting: Oncology

## 2014-10-24 ENCOUNTER — Telehealth: Payer: Self-pay | Admitting: Oncology

## 2014-10-24 ENCOUNTER — Other Ambulatory Visit (HOSPITAL_BASED_OUTPATIENT_CLINIC_OR_DEPARTMENT_OTHER): Payer: Medicare Other

## 2014-10-24 ENCOUNTER — Other Ambulatory Visit: Payer: Self-pay | Admitting: *Deleted

## 2014-10-24 VITALS — BP 167/61 | HR 76 | Temp 97.5°F | Resp 18 | Ht 64.0 in | Wt 122.4 lb

## 2014-10-24 DIAGNOSIS — C189 Malignant neoplasm of colon, unspecified: Secondary | ICD-10-CM

## 2014-10-24 DIAGNOSIS — C180 Malignant neoplasm of cecum: Secondary | ICD-10-CM

## 2014-10-24 DIAGNOSIS — L270 Generalized skin eruption due to drugs and medicaments taken internally: Secondary | ICD-10-CM

## 2014-10-24 LAB — CBC WITH DIFFERENTIAL/PLATELET
BASO%: 0.9 % (ref 0.0–2.0)
Basophils Absolute: 0 10*3/uL (ref 0.0–0.1)
EOS%: 5.3 % (ref 0.0–7.0)
Eosinophils Absolute: 0.2 10*3/uL (ref 0.0–0.5)
HCT: 35.6 % (ref 34.8–46.6)
HGB: 11.9 g/dL (ref 11.6–15.9)
LYMPH#: 2.2 10*3/uL (ref 0.9–3.3)
LYMPH%: 48.1 % (ref 14.0–49.7)
MCH: 30.8 pg (ref 25.1–34.0)
MCHC: 33.4 g/dL (ref 31.5–36.0)
MCV: 92.2 fL (ref 79.5–101.0)
MONO#: 0.3 10*3/uL (ref 0.1–0.9)
MONO%: 7.3 % (ref 0.0–14.0)
NEUT#: 1.8 10*3/uL (ref 1.5–6.5)
NEUT%: 38.4 % (ref 38.4–76.8)
NRBC: 0 % (ref 0–0)
Platelets: 163 10*3/uL (ref 145–400)
RBC: 3.86 10*6/uL (ref 3.70–5.45)
RDW: 26.8 % — AB (ref 11.2–14.5)
WBC: 4.6 10*3/uL (ref 3.9–10.3)

## 2014-10-24 MED ORDER — CAPECITABINE 500 MG PO TABS: ORAL_TABLET | ORAL | Status: DC

## 2014-10-24 NOTE — Progress Notes (Signed)
  [place] OFFICE PROGRESS NOTE   Diagnosis: colon cancer  INTERVAL HISTORY:   Ms. Carrie Page returns as scheduled. She completed another cycle of Xeloda beginning on 10/10/2014. No nausea, mouth sores, or diarrhea. No hand or foot pain. She feels well.  Objective:  Vital signs in last 24 hours:  Blood pressure 167/61, pulse 76, temperature 97.5 F (36.4 C), temperature source Oral, resp. rate 18, height $RemoveBe'5\' 4"'mKrDkxMHA$  (1.626 m), weight 122 lb 6.4 oz (55.52 kg), SpO2 100 %.    HEENT: no thrush or ulcers Resp: lungs clear bilaterally Cardio: regular rate and rhythm GI: no hepatomegaly, nontender Vascular: no leg edema  Skin:hands and feet without erythema or skin breakdown.    Lab Results:  Lab Results  Component Value Date   WBC 4.6 10/24/2014   HGB 11.9 10/24/2014   HCT 35.6 10/24/2014   MCV 92.2 10/24/2014   PLT 163 10/24/2014   NEUTROABS 1.8 10/24/2014     Imaging:  No results found.  Medications: I have reviewed the patient's current medications.  Assessment/Plan: 1. 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   Cycle 2 adjuvant Xeloda 08/08/2014.   Cycle 3 adjuvant Xeloda 08/29/2014.  Cycle 4 adjuvant Xeloda 09/19/2014.  Cycle 5 adjuvant Xeloda 10/10/2014  2. History of iron deficiency anemia. 3. Early hand-foot syndrome with mild erythema/tenderness of the palms and soles 10/03/2014.   Disposition:  Carrie Page appears to be tolerating the Xeloda well. She will complete cycle 6 beginning 10/31/2014. She will be scheduled for an office visit on 11/14/2014.  Betsy Coder, MD  10/24/2014  8:58 AM

## 2014-10-24 NOTE — Telephone Encounter (Signed)
Pt confirmed labs/ov per 11/16 POF, gave pt AVS.... KJ °

## 2014-11-04 ENCOUNTER — Telehealth: Payer: Self-pay | Admitting: Oncology

## 2014-11-04 NOTE — Telephone Encounter (Signed)
APPT FOR 12/28 MOVED FROM LT TO BS DUE TO LT PAL - D/T PER LT. S/W PT SHE IS AWARE. PT WILL GET NEW SCHEDULE AT 12/7 APPT.

## 2014-11-14 ENCOUNTER — Ambulatory Visit (HOSPITAL_BASED_OUTPATIENT_CLINIC_OR_DEPARTMENT_OTHER): Payer: Medicare Other | Admitting: Nurse Practitioner

## 2014-11-14 ENCOUNTER — Other Ambulatory Visit (HOSPITAL_BASED_OUTPATIENT_CLINIC_OR_DEPARTMENT_OTHER): Payer: Medicare Other

## 2014-11-14 VITALS — BP 164/57 | HR 72 | Temp 98.3°F | Resp 18 | Ht 64.0 in | Wt 121.9 lb

## 2014-11-14 DIAGNOSIS — C189 Malignant neoplasm of colon, unspecified: Secondary | ICD-10-CM

## 2014-11-14 DIAGNOSIS — C180 Malignant neoplasm of cecum: Secondary | ICD-10-CM

## 2014-11-14 DIAGNOSIS — L271 Localized skin eruption due to drugs and medicaments taken internally: Secondary | ICD-10-CM

## 2014-11-14 LAB — COMPREHENSIVE METABOLIC PANEL (CC13)
ALK PHOS: 115 U/L (ref 40–150)
ALT: 20 U/L (ref 0–55)
AST: 23 U/L (ref 5–34)
Albumin: 4 g/dL (ref 3.5–5.0)
Anion Gap: 10 mEq/L (ref 3–11)
BUN: 19.2 mg/dL (ref 7.0–26.0)
CALCIUM: 9.6 mg/dL (ref 8.4–10.4)
CO2: 25 mEq/L (ref 22–29)
Chloride: 105 mEq/L (ref 98–109)
Creatinine: 0.7 mg/dL (ref 0.6–1.1)
EGFR: 80 mL/min/{1.73_m2} — ABNORMAL LOW (ref >90)
Glucose: 104 mg/dl (ref 70–140)
Potassium: 3.9 mEq/L (ref 3.5–5.1)
SODIUM: 140 meq/L (ref 136–145)
TOTAL PROTEIN: 6.8 g/dL (ref 6.4–8.3)
Total Bilirubin: 1.35 mg/dL — ABNORMAL HIGH (ref 0.20–1.20)

## 2014-11-14 LAB — CBC WITH DIFFERENTIAL/PLATELET
BASO%: 1.5 % (ref 0.0–2.0)
Basophils Absolute: 0.1 10*3/uL (ref 0.0–0.1)
EOS ABS: 0.2 10*3/uL (ref 0.0–0.5)
EOS%: 3.7 % (ref 0.0–7.0)
HCT: 37.6 % (ref 34.8–46.6)
HEMOGLOBIN: 12.2 g/dL (ref 11.6–15.9)
LYMPH%: 39.9 % (ref 14.0–49.7)
MCH: 32.4 pg (ref 25.1–34.0)
MCHC: 32.5 g/dL (ref 31.5–36.0)
MCV: 99.9 fL (ref 79.5–101.0)
MONO#: 0.4 10*3/uL (ref 0.1–0.9)
MONO%: 7 % (ref 0.0–14.0)
NEUT%: 47.9 % (ref 38.4–76.8)
NEUTROS ABS: 2.5 10*3/uL (ref 1.5–6.5)
Platelets: 171 10*3/uL (ref 145–400)
RBC: 3.77 10*6/uL (ref 3.70–5.45)
RDW: 25.2 % — AB (ref 11.2–14.5)
WBC: 5.2 10*3/uL (ref 3.9–10.3)
lymph#: 2.1 10*3/uL (ref 0.9–3.3)

## 2014-11-14 MED ORDER — CAPECITABINE 500 MG PO TABS: ORAL_TABLET | ORAL | Status: DC

## 2014-11-14 NOTE — Progress Notes (Signed)
  [place] OFFICE PROGRESS NOTE   Diagnosis:  Colon cancer  INTERVAL HISTORY:   Ms. Rozak returns as scheduled. She completed cycle 6 Xeloda beginning 10/31/2014. She denies nausea/vomiting. No mouth sores. No diarrhea. She has stable redness on the hands. She has recently noted "itching" especially after her hands have been in hot water. She denies any pain. No redness or pain involving the feet.  Objective:  Vital signs in last 24 hours:  Blood pressure 164/57, pulse 72, temperature 98.3 F (36.8 C), resp. rate 18, height 5' 4" (1.626 m), weight 121 lb 14.4 oz (55.293 kg), SpO2 100 %.    HEENT: no thrush or ulcers. Resp: lungs clear bilaterally. Cardio: regular rate and rhythm. GI: abdomen soft and nontender. No hepatomegaly. Vascular: no leg edema.  Skin: palms and soles with mild erythema. No skin breakdown.   Lab Results:  Lab Results  Component Value Date   WBC 5.2 11/14/2014   HGB 12.2 11/14/2014   HCT 37.6 11/14/2014   MCV 99.9 11/14/2014   PLT 171 11/14/2014   NEUTROABS 2.5 11/14/2014    Imaging:  No results found.  Medications: I have reviewed the patient's current medications.  Assessment/Plan: 1. 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   Cycle 2 adjuvant Xeloda 08/08/2014.   Cycle 3 adjuvant Xeloda 08/29/2014.  Cycle 4 adjuvant Xeloda 09/19/2014.  Cycle 5 adjuvant Xeloda 10/10/2014  Cycle 6 adjuvant Xeloda 10/31/2014.  2. History of iron deficiency anemia. 3. Early hand-foot syndrome with mild erythema/tenderness of the palms and soles 10/03/2014.   Disposition: Carrie Page appears stable. She has completed 6 cycles of adjuvant Xeloda. She will  proceed with cycle 7 as scheduled beginning 11/21/2014.   She will return for a followup visit on 12/05/2014. She understands to contact the office in the interim with progressive symptoms of hand-foot syndrome.    Ned Card ANP/GNP-BC   11/14/2014  12:05 PM

## 2014-11-15 ENCOUNTER — Other Ambulatory Visit: Payer: Self-pay | Admitting: Nurse Practitioner

## 2014-11-15 DIAGNOSIS — C189 Malignant neoplasm of colon, unspecified: Secondary | ICD-10-CM

## 2014-11-24 ENCOUNTER — Ambulatory Visit: Payer: Medicare Other

## 2014-12-01 ENCOUNTER — Other Ambulatory Visit: Payer: Medicare Other

## 2014-12-01 ENCOUNTER — Ambulatory Visit: Payer: Medicare Other | Admitting: Oncology

## 2014-12-05 ENCOUNTER — Ambulatory Visit (HOSPITAL_BASED_OUTPATIENT_CLINIC_OR_DEPARTMENT_OTHER): Payer: Medicare Other | Admitting: Oncology

## 2014-12-05 ENCOUNTER — Other Ambulatory Visit: Payer: Self-pay | Admitting: *Deleted

## 2014-12-05 ENCOUNTER — Other Ambulatory Visit (HOSPITAL_BASED_OUTPATIENT_CLINIC_OR_DEPARTMENT_OTHER): Payer: Medicare Other

## 2014-12-05 ENCOUNTER — Telehealth: Payer: Self-pay | Admitting: Oncology

## 2014-12-05 VITALS — BP 175/65 | HR 70 | Temp 97.6°F | Resp 18 | Ht 64.0 in | Wt 122.2 lb

## 2014-12-05 DIAGNOSIS — C180 Malignant neoplasm of cecum: Secondary | ICD-10-CM

## 2014-12-05 DIAGNOSIS — L271 Localized skin eruption due to drugs and medicaments taken internally: Secondary | ICD-10-CM

## 2014-12-05 DIAGNOSIS — C189 Malignant neoplasm of colon, unspecified: Secondary | ICD-10-CM

## 2014-12-05 LAB — COMPREHENSIVE METABOLIC PANEL (CC13)
ALBUMIN: 4 g/dL (ref 3.5–5.0)
ALT: 23 U/L (ref 0–55)
ANION GAP: 5 meq/L (ref 3–11)
AST: 22 U/L (ref 5–34)
Alkaline Phosphatase: 117 U/L (ref 40–150)
BUN: 18.6 mg/dL (ref 7.0–26.0)
CALCIUM: 9.5 mg/dL (ref 8.4–10.4)
CHLORIDE: 107 meq/L (ref 98–109)
CO2: 26 meq/L (ref 22–29)
Creatinine: 0.7 mg/dL (ref 0.6–1.1)
EGFR: 78 mL/min/{1.73_m2} — ABNORMAL LOW (ref >90)
GLUCOSE: 104 mg/dL (ref 70–140)
Potassium: 4.3 mEq/L (ref 3.5–5.1)
SODIUM: 139 meq/L (ref 136–145)
Total Bilirubin: 0.82 mg/dL (ref 0.20–1.20)
Total Protein: 7 g/dL (ref 6.4–8.3)

## 2014-12-05 LAB — CBC WITH DIFFERENTIAL/PLATELET
BASO%: 1.4 % (ref 0.0–2.0)
BASOS ABS: 0.1 10*3/uL (ref 0.0–0.1)
EOS%: 4.8 % (ref 0.0–7.0)
Eosinophils Absolute: 0.2 10*3/uL (ref 0.0–0.5)
HEMATOCRIT: 37.3 % (ref 34.8–46.6)
HEMOGLOBIN: 12.3 g/dL (ref 11.6–15.9)
LYMPH%: 40.2 % (ref 14.0–49.7)
MCH: 34.2 pg — AB (ref 25.1–34.0)
MCHC: 32.9 g/dL (ref 31.5–36.0)
MCV: 104.1 fL — ABNORMAL HIGH (ref 79.5–101.0)
MONO#: 0.4 10*3/uL (ref 0.1–0.9)
MONO%: 7.4 % (ref 0.0–14.0)
NEUT#: 2.4 10*3/uL (ref 1.5–6.5)
NEUT%: 46.2 % (ref 38.4–76.8)
PLATELETS: 156 10*3/uL (ref 145–400)
RBC: 3.58 10*6/uL — ABNORMAL LOW (ref 3.70–5.45)
RDW: 20.9 % — ABNORMAL HIGH (ref 11.2–14.5)
WBC: 5.1 10*3/uL (ref 3.9–10.3)
lymph#: 2.1 10*3/uL (ref 0.9–3.3)

## 2014-12-05 MED ORDER — CAPECITABINE 500 MG PO TABS: ORAL_TABLET | ORAL | Status: DC

## 2014-12-05 NOTE — Telephone Encounter (Signed)
Pt confirmed labs/ov per 12/28 POF, gave pt AVS.... KJ °

## 2014-12-05 NOTE — Progress Notes (Signed)
  [place] OFFICE PROGRESS NOTE   Diagnosis: Colon cancer  INTERVAL HISTORY:   Ms. Carrie Page returns as scheduled. She completed another cycle of Xeloda beginning 11/21/2014. No diarrhea or hand/foot pain. She continues to have a rash at the dorsum of the hands with pruritus.  Objective:  Vital signs in last 24 hours:  Blood pressure 175/65, pulse 70, temperature 97.6 F (36.4 C), temperature source Oral, resp. rate 18, height $RemoveBe'5\' 4"'EfnVGuxuK$  (1.626 m), weight 122 lb 3.2 oz (55.43 kg), SpO2 100 %.    HEENT: No thrush or ulcers Resp: Lungs clear bilaterally Cardio: Regular rate and rhythm GI: No hepatomegaly, nontender Vascular: No leg edema  Skin: Mild erythematous rash at the dorsum of the hands, skin thickening of the palms. Palms and soles without erythema   Portacath/PICC-without erythema  Lab Results:  Lab Results  Component Value Date   WBC 5.1 12/05/2014   HGB 12.3 12/05/2014   HCT 37.3 12/05/2014   MCV 104.1* 12/05/2014   PLT 156 12/05/2014   NEUTROABS 2.4 12/05/2014     Medications: I have reviewed the patient's current medications.  Assessment/Plan: 1. 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   Cycle 2 adjuvant Xeloda 08/08/2014.   Cycle 3 adjuvant Xeloda 08/29/2014.  Cycle 4 adjuvant Xeloda 09/19/2014.  Cycle 5 adjuvant Xeloda 10/10/2014  Cycle 6 adjuvant Xeloda 10/31/2014  Cycle 7 adjuvant Xeloda 11/21/2014   2. History of iron deficiency anemia 3. Early hand-foot syndrome with mild erythema/tenderness of the palms and soles 10/03/2014   Disposition:  She has completed 7 cycles of adjuvant Xeloda. She continues to tolerate the Xeloda well. She will proceed with  cycle 8 beginning 12/12/2013. Carrie Page will return for an office visit and CEA in one month.  She will discontinue iron.  Betsy Coder, MD  12/05/2014  12:43 PM

## 2014-12-12 ENCOUNTER — Telehealth: Payer: Self-pay | Admitting: *Deleted

## 2014-12-12 NOTE — Telephone Encounter (Signed)
Received phone call from patient stating that she had diarrhea and cramping Friday and Saturday but is feeling better today.  Informed Elby Showers. Marcello Moores, NP.  Informed patient to stop taking Xeloda and call us back tomorrow with an update, Per Lattie Haw K. Marcello Moores, NP.  Patient verbalized understanding.

## 2014-12-13 ENCOUNTER — Telehealth: Payer: Self-pay | Admitting: Nurse Practitioner

## 2014-12-13 NOTE — Telephone Encounter (Signed)
I spoke with Ms. Wyre regarding the diarrhea. She began experiencing diarrhea last week. It significantly worsened on 12/10/2014 and 12/11/2014. She had as many as 12 loose stools in a 24 hour period. She noted improvement with Imodium. She started cycle 8 Xeloda on 12/12/2014 and contacted the office to notify us of the diarrhea. We instructed her to hold the evening dose and call back today with an update. She is no longer having diarrhea and reports stools are "near-normal". I instructed her to resume Xeloda today at a reduced dose of 1000 mg every morning and 500 mg every afternoon to complete cycle 8. She understands to discontinue Xeloda and contact the office with recurrent diarrhea.

## 2015-01-10 ENCOUNTER — Ambulatory Visit (HOSPITAL_BASED_OUTPATIENT_CLINIC_OR_DEPARTMENT_OTHER): Payer: Medicare Other | Admitting: Nurse Practitioner

## 2015-01-10 ENCOUNTER — Other Ambulatory Visit (HOSPITAL_BASED_OUTPATIENT_CLINIC_OR_DEPARTMENT_OTHER): Payer: Medicare Other

## 2015-01-10 ENCOUNTER — Telehealth: Payer: Self-pay | Admitting: Oncology

## 2015-01-10 VITALS — BP 176/66 | HR 70 | Temp 97.7°F | Resp 18 | Ht 64.0 in | Wt 121.3 lb

## 2015-01-10 DIAGNOSIS — C180 Malignant neoplasm of cecum: Secondary | ICD-10-CM

## 2015-01-10 DIAGNOSIS — C189 Malignant neoplasm of colon, unspecified: Secondary | ICD-10-CM | POA: Diagnosis not present

## 2015-01-10 LAB — CBC WITH DIFFERENTIAL/PLATELET
BASO%: 1.2 % (ref 0.0–2.0)
BASOS ABS: 0.1 10*3/uL (ref 0.0–0.1)
EOS%: 5.1 % (ref 0.0–7.0)
Eosinophils Absolute: 0.2 10*3/uL (ref 0.0–0.5)
HCT: 39.2 % (ref 34.8–46.6)
HEMOGLOBIN: 12.7 g/dL (ref 11.6–15.9)
LYMPH%: 39.1 % (ref 14.0–49.7)
MCH: 34.4 pg — ABNORMAL HIGH (ref 25.1–34.0)
MCHC: 32.5 g/dL (ref 31.5–36.0)
MCV: 105.8 fL — AB (ref 79.5–101.0)
MONO#: 0.4 10*3/uL (ref 0.1–0.9)
MONO%: 9.1 % (ref 0.0–14.0)
NEUT%: 45.5 % (ref 38.4–76.8)
NEUTROS ABS: 2.2 10*3/uL (ref 1.5–6.5)
PLATELETS: 174 10*3/uL (ref 145–400)
RBC: 3.71 10*6/uL (ref 3.70–5.45)
RDW: 19 % — ABNORMAL HIGH (ref 11.2–14.5)
WBC: 4.8 10*3/uL (ref 3.9–10.3)
lymph#: 1.9 10*3/uL (ref 0.9–3.3)

## 2015-01-10 NOTE — Progress Notes (Signed)
  [place] OFFICE PROGRESS NOTE   Diagnosis:  Colon cancer  INTERVAL HISTORY:   Carrie Page returns as scheduled. She completed the eighth and final cycle of adjuvant Xeloda beginning 12/13/2014. The dose was reduced due to diarrhea following cycle 7. She was able to complete cycle 8 with no significant diarrhea. She denies nausea/vomiting. No mouth sores. She notes less pruritus over the palms. No hand or foot pain.  Objective:  Vital signs in last 24 hours:  Blood pressure 176/66, pulse 70, temperature 97.7 F (36.5 C), temperature source Oral, resp. rate 18, height $RemoveBe'5\' 4"'uomGWAOHD$  (1.626 m), weight 121 lb 4.8 oz (55.021 kg).    HEENT: No thrush or ulcers. Lymphatics: No palpable cervical, supraclavicular, axillary or inguinal lymph nodes. Resp: Lungs clear bilaterally. Cardio: Regular rate and rhythm. GI: Abdomen soft and nontender. No hepatomegaly. No mass. Vascular: No leg edema.  Skin: Palms without erythema. Skin thickening of the palms.    Lab Results:  Lab Results  Component Value Date   WBC 4.8 01/10/2015   HGB 12.7 01/10/2015   HCT 39.2 01/10/2015   MCV 105.8* 01/10/2015   PLT 174 01/10/2015   NEUTROABS 2.2 01/10/2015    Imaging:  No results found.  Medications: I have reviewed the patient's current medications.  Assessment/Plan: 1. 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   Cycle 2 adjuvant Xeloda 08/08/2014.   Cycle 3 adjuvant Xeloda 08/29/2014.  Cycle 4 adjuvant Xeloda 09/19/2014.  Cycle 5 adjuvant Xeloda 10/10/2014  Cycle 6 adjuvant Xeloda 10/31/2014  Cycle 7 adjuvant Xeloda 11/21/2014  Cycle 8 adjuvant Xeloda 12/13/2014   2. History of iron  deficiency anemia 3. Early hand-foot syndrome with mild erythema/tenderness of the palms and soles 10/03/2014  Disposition: [name] appears stable. She has completed the course of adjuvant Xeloda. We obtained a baseline CEA today. She will have CT scans approximately one year from diagnosis. We scheduled a return visit in mid June to review the results. She will schedule a one-year colonoscopy with Dr. Deatra Ina.  Plan reviewed with Dr. Benay Spice.  Ned Card ANP/GNP-BC   01/10/2015  12:19 PM

## 2015-01-10 NOTE — Telephone Encounter (Signed)
Pt confirmed labs/ov per 02/02 POF, gave pt AVS.... KJ, gave pt barium

## 2015-01-11 LAB — CEA: CEA: 3.3 ng/mL (ref 0.0–5.0)

## 2015-01-12 ENCOUNTER — Other Ambulatory Visit: Payer: Self-pay

## 2015-03-06 ENCOUNTER — Encounter: Payer: Self-pay | Admitting: Gastroenterology

## 2015-03-16 ENCOUNTER — Encounter: Payer: Self-pay | Admitting: Internal Medicine

## 2015-03-16 ENCOUNTER — Other Ambulatory Visit (INDEPENDENT_AMBULATORY_CARE_PROVIDER_SITE_OTHER): Payer: Medicare Other

## 2015-03-16 ENCOUNTER — Other Ambulatory Visit: Payer: Medicare Other

## 2015-03-16 ENCOUNTER — Ambulatory Visit (INDEPENDENT_AMBULATORY_CARE_PROVIDER_SITE_OTHER): Payer: Medicare Other | Admitting: Internal Medicine

## 2015-03-16 VITALS — BP 140/76 | HR 71 | Temp 97.8°F | Resp 18 | Ht 64.0 in | Wt 121.1 lb

## 2015-03-16 DIAGNOSIS — E785 Hyperlipidemia, unspecified: Secondary | ICD-10-CM

## 2015-03-16 DIAGNOSIS — I10 Essential (primary) hypertension: Secondary | ICD-10-CM

## 2015-03-16 DIAGNOSIS — Z0000 Encounter for general adult medical examination without abnormal findings: Secondary | ICD-10-CM

## 2015-03-16 DIAGNOSIS — R7302 Impaired glucose tolerance (oral): Secondary | ICD-10-CM

## 2015-03-16 LAB — CBC WITH DIFFERENTIAL/PLATELET
BASOS PCT: 0.9 % (ref 0.0–3.0)
Basophils Absolute: 0.1 10*3/uL (ref 0.0–0.1)
EOS PCT: 1.5 % (ref 0.0–5.0)
Eosinophils Absolute: 0.1 10*3/uL (ref 0.0–0.7)
HEMATOCRIT: 40.4 % (ref 36.0–46.0)
HEMOGLOBIN: 13.6 g/dL (ref 12.0–15.0)
LYMPHS ABS: 2.1 10*3/uL (ref 0.7–4.0)
Lymphocytes Relative: 31.2 % (ref 12.0–46.0)
MCHC: 33.8 g/dL (ref 30.0–36.0)
MCV: 93 fl (ref 78.0–100.0)
Monocytes Absolute: 0.4 10*3/uL (ref 0.1–1.0)
Monocytes Relative: 5.6 % (ref 3.0–12.0)
NEUTROS PCT: 60.8 % (ref 43.0–77.0)
Neutro Abs: 4.1 10*3/uL (ref 1.4–7.7)
Platelets: 209 10*3/uL (ref 150.0–400.0)
RBC: 4.35 Mil/uL (ref 3.87–5.11)
RDW: 14.6 % (ref 11.5–15.5)
WBC: 6.7 10*3/uL (ref 4.0–10.5)

## 2015-03-16 LAB — URINALYSIS, ROUTINE W REFLEX MICROSCOPIC
Bilirubin Urine: NEGATIVE
Ketones, ur: NEGATIVE
NITRITE: NEGATIVE
Specific Gravity, Urine: 1.02 (ref 1.000–1.030)
Total Protein, Urine: NEGATIVE
Urine Glucose: NEGATIVE
Urobilinogen, UA: 0.2 (ref 0.0–1.0)
pH: 5.5 (ref 5.0–8.0)

## 2015-03-16 LAB — BASIC METABOLIC PANEL
BUN: 22 mg/dL (ref 6–23)
CHLORIDE: 101 meq/L (ref 96–112)
CO2: 29 mEq/L (ref 19–32)
CREATININE: 0.57 mg/dL (ref 0.40–1.20)
Calcium: 9.9 mg/dL (ref 8.4–10.5)
GFR: 107.29 mL/min (ref >60.00)
Glucose, Bld: 109 mg/dL — ABNORMAL HIGH (ref 70–99)
POTASSIUM: 4.1 meq/L (ref 3.5–5.1)
Sodium: 137 mEq/L (ref 135–145)

## 2015-03-16 LAB — LIPID PANEL
Cholesterol: 197 mg/dL (ref 0–200)
HDL: 65.2 mg/dL (ref >39.00)
LDL CALC: 108 mg/dL — AB (ref 0–99)
NONHDL: 131.8
Total CHOL/HDL Ratio: 3
Triglycerides: 119 mg/dL (ref 0.0–149.0)
VLDL: 23.8 mg/dL (ref 0.0–40.0)

## 2015-03-16 LAB — HEPATIC FUNCTION PANEL
ALT: 16 U/L (ref 0–35)
AST: 20 U/L (ref 0–37)
Albumin: 4.3 g/dL (ref 3.5–5.2)
Alkaline Phosphatase: 96 U/L (ref 39–117)
Bilirubin, Direct: 0.1 mg/dL (ref 0.0–0.3)
TOTAL PROTEIN: 7.6 g/dL (ref 6.0–8.3)
Total Bilirubin: 0.6 mg/dL (ref 0.2–1.2)

## 2015-03-16 LAB — HEMOGLOBIN A1C: Hgb A1c MFr Bld: 5.9 % (ref 4.6–6.5)

## 2015-03-16 LAB — TSH: TSH: 1.19 u[IU]/mL (ref 0.35–4.50)

## 2015-03-16 NOTE — Patient Instructions (Addendum)

## 2015-03-16 NOTE — Assessment & Plan Note (Signed)

## 2015-03-16 NOTE — Progress Notes (Signed)
Subjective:    Patient ID: Carrie Page, female    DOB: [birth year], 81 y.o.   MRN: [id number]  HPI  Here for wellness and f/u;  Overall doing ok;  Pt denies Chest pain, worsening SOB, DOE, wheezing, orthopnea, PND, worsening LE edema, palpitations, dizziness or syncope.  Pt denies neurological change such as new headache, facial or extremity weakness.  Pt denies polydipsia, polyuria, or low sugar symptoms. Pt states overall good compliance with treatment and medications, good tolerability, and has been trying to follow appropriate diet.  Pt denies worsening depressive symptoms, suicidal ideation or panic. No fever, night sweats, wt loss, loss of appetite, or other constitutional symptoms.  Pt states good ability with ADL's, has low fall risk, home safety reviewed and adequate, no other significant changes in hearing or vision, and only occasionally active with exercise. No current complaints. Finished CMT, followed closely per oncology, for f/u CT June 2016 Past Medical History  Diagnosis Date  . Impaired glucose tolerance   . Arthritis   . Allergy   . Allergic rhinitis, cause unspecified 01/28/2012  . Degenerative joint disease 01/28/2012  . Hypertension   . Depression   . Anxiety 01/28/2012  . Anemia   . PONV (postoperative nausea and vomiting)   . Osteoporosis     RT SHOULDER  . History of nonmelanoma skin cancer   . Cecal cancer    Past Surgical History  Procedure Laterality Date  . Abdominal hysterectomy    . Appendectomy    . Tonsillectomy    . Cholecystectomy    . Back surgery  2010    MICRODCISCECTOMY  . Laparoscopic partial colectomy N/A 06/20/2014    Procedure: LAPAROSCOPIC ASSISTED RIGHT PARTIAL COLECTOMY;  Surgeon: Leighton Ruff, MD;  Location: [facility];  Service: General;  Laterality: N/A;    reports that she has never smoked. She has never used smokeless tobacco. She reports that she does not drink alcohol or use illicit drugs. family history includes Arthritis in her  other; Heart disease in her other; Stroke in her other. Allergies  Allergen Reactions  . Lovastatin Other (See Comments)    Cognitive slowing and myalgias  . Nitrofurantoin Other (See Comments)    GI upset  . Penicillins   . Pravastatin Other (See Comments)    Myalgias, increased dreams   Current Outpatient Prescriptions on File Prior to Visit  Medication Sig Dispense Refill  . aspirin EC 81 MG tablet Take 81 mg by mouth daily.    . Cholecalciferol (VITAMIN D3) 1000 UNITS CAPS Take 1 capsule by mouth daily.    . ferrous sulfate 325 (65 FE) MG tablet Take 650 mg by mouth daily with breakfast.     . irbesartan (AVAPRO) 300 MG tablet Take 1 tablet (300 mg total) by mouth every morning. 90 tablet 3  . Multiple Vitamin (MULTIVITAMIN) tablet Take 1 tablet by mouth daily.     No current facility-administered medications on file prior to visit.    Review of Systems Constitutional: Negative for increased diaphoresis, other activity, appetite or siginficant weight change other than noted HENT: Negative for worsening hearing loss, ear pain, facial swelling, mouth sores and neck stiffness.   Eyes: Negative for other worsening pain, redness or visual disturbance.  Respiratory: Negative for shortness of breath and wheezing  Cardiovascular: Negative for chest pain and palpitations.  Gastrointestinal: Negative for diarrhea, blood in stool, abdominal distention or other pain Genitourinary: Negative for hematuria, flank pain or change in urine volume.  Musculoskeletal:  Negative for myalgias or other joint complaints.  Skin: Negative for color change and wound or drainage.  Neurological: Negative for syncope and numbness. other than noted Hematological: Negative for adenopathy. or other swelling Psychiatric/Behavioral: Negative for hallucinations, SI, self-injury, decreased concentration or other worsening agitation.      Objective:   Physical Exam BP 140/76 mmHg  Pulse 71  Temp(Src) 97.8 F  (36.6 C) (Oral)  Resp 18  Ht 5\' 4"  (1.626 m)  Wt 121 lb 1.3 oz (54.922 kg)  BMI 20.77 kg/m2  SpO2 99% VS noted,  Constitutional: Pt is oriented to person, place, and time. Appears well-developed and well-nourished, in no significant distress Head: Normocephalic and atraumatic.  Right Ear: External ear normal.  Left Ear: External ear normal.  Nose: Nose normal.  Mouth/Throat: Oropharynx is clear and moist.  Eyes: Conjunctivae and EOM are normal. Pupils are equal, round, and reactive to light.  Neck: Normal range of motion. Neck supple. No JVD present. No tracheal deviation present or significant neck LA or mass Cardiovascular: Normal rate, regular rhythm, normal heart sounds and intact distal pulses.   Pulmonary/Chest: Effort normal and breath sounds without rales or wheezing  Abdominal: Soft. Bowel sounds are normal. NT. No HSM  Musculoskeletal: Normal range of motion. Exhibits no edema.  Lymphadenopathy:  Has no cervical adenopathy.  Neurological: Pt is alert and oriented to person, place, and time. Pt has normal reflexes. No cranial nerve deficit. Motor grossly intact Skin: Skin is warm and dry. No rash noted.  Psychiatric:  Has normal mood and affect. Behavior is normal.  Wt Readings from Last 3 Encounters:  03/16/15 121 lb 1.3 oz (54.922 kg)  01/10/15 121 lb 4.8 oz (55.021 kg)  12/05/14 122 lb 3.2 oz (55.43 kg)       Assessment & Plan:

## 2015-03-16 NOTE — Progress Notes (Signed)
Pre visit review using our clinic review tool, if applicable. No additional management support is needed unless otherwise documented below in the visit note. 

## 2015-03-18 NOTE — Assessment & Plan Note (Signed)
stable overall by history and exam, recent data reviewed with pt, and pt to continue medical treatment as before,  to f/u any worsening symptoms or concerns Lab Results  Component Value Date   HGBA1C 5.9 03/16/2015

## 2015-03-21 DIAGNOSIS — C182 Malignant neoplasm of ascending colon: Secondary | ICD-10-CM | POA: Diagnosis not present

## 2015-05-15 ENCOUNTER — Other Ambulatory Visit (HOSPITAL_BASED_OUTPATIENT_CLINIC_OR_DEPARTMENT_OTHER): Payer: Medicare Other

## 2015-05-15 DIAGNOSIS — C189 Malignant neoplasm of colon, unspecified: Secondary | ICD-10-CM

## 2015-05-15 DIAGNOSIS — C180 Malignant neoplasm of cecum: Secondary | ICD-10-CM

## 2015-05-15 LAB — CBC WITH DIFFERENTIAL/PLATELET
BASO%: 1.1 % (ref 0.0–2.0)
Basophils Absolute: 0.1 10*3/uL (ref 0.0–0.1)
EOS%: 3.5 % (ref 0.0–7.0)
Eosinophils Absolute: 0.2 10*3/uL (ref 0.0–0.5)
HCT: 40.3 % (ref 34.8–46.6)
HEMOGLOBIN: 13.4 g/dL (ref 11.6–15.9)
LYMPH#: 1.7 10*3/uL (ref 0.9–3.3)
LYMPH%: 34.4 % (ref 14.0–49.7)
MCH: 29.2 pg (ref 25.1–34.0)
MCHC: 33.2 g/dL (ref 31.5–36.0)
MCV: 87.7 fL (ref 79.5–101.0)
MONO#: 0.4 10*3/uL (ref 0.1–0.9)
MONO%: 7.7 % (ref 0.0–14.0)
NEUT%: 53.3 % (ref 38.4–76.8)
NEUTROS ABS: 2.7 10*3/uL (ref 1.5–6.5)
PLATELETS: 185 10*3/uL (ref 145–400)
RBC: 4.59 10*6/uL (ref 3.70–5.45)
RDW: 12.8 % (ref 11.2–14.5)
WBC: 5 10*3/uL (ref 3.9–10.3)

## 2015-05-15 LAB — COMPREHENSIVE METABOLIC PANEL (CC13)
ALBUMIN: 3.8 g/dL (ref 3.5–5.0)
ALT: 15 U/L (ref 0–55)
AST: 22 U/L (ref 5–34)
Alkaline Phosphatase: 96 U/L (ref 40–150)
Anion Gap: 8 mEq/L (ref 3–11)
BUN: 18.3 mg/dL (ref 7.0–26.0)
CO2: 27 meq/L (ref 22–29)
CREATININE: 0.7 mg/dL (ref 0.6–1.1)
Calcium: 9.6 mg/dL (ref 8.4–10.4)
Chloride: 105 mEq/L (ref 98–109)
EGFR: 80 mL/min/{1.73_m2} — ABNORMAL LOW (ref >90)
Glucose: 104 mg/dl (ref 70–140)
Potassium: 4.6 mEq/L (ref 3.5–5.1)
SODIUM: 140 meq/L (ref 136–145)
Total Bilirubin: 0.73 mg/dL (ref 0.20–1.20)
Total Protein: 7.1 g/dL (ref 6.4–8.3)

## 2015-05-16 LAB — CEA: CEA: 0.7 ng/mL (ref 0.0–5.0)

## 2015-05-22 ENCOUNTER — Telehealth: Payer: Self-pay | Admitting: *Deleted

## 2015-05-22 ENCOUNTER — Telehealth: Payer: Self-pay | Admitting: Oncology

## 2015-05-22 ENCOUNTER — Ambulatory Visit (HOSPITAL_BASED_OUTPATIENT_CLINIC_OR_DEPARTMENT_OTHER): Payer: Medicare Other | Admitting: Oncology

## 2015-05-22 VITALS — BP 174/73 | HR 65 | Temp 97.4°F | Resp 18 | Ht 64.0 in | Wt 120.3 lb

## 2015-05-22 DIAGNOSIS — D509 Iron deficiency anemia, unspecified: Secondary | ICD-10-CM | POA: Diagnosis not present

## 2015-05-22 DIAGNOSIS — C180 Malignant neoplasm of cecum: Secondary | ICD-10-CM

## 2015-05-22 DIAGNOSIS — C189 Malignant neoplasm of colon, unspecified: Secondary | ICD-10-CM

## 2015-05-22 NOTE — Telephone Encounter (Signed)
Carrie Page from [facility] call me and advised that she can not sched ct there without it being through their DR. i lvm for pt advising that [facility] radiology will be contacting her with an appt.

## 2015-05-22 NOTE — Telephone Encounter (Signed)
Patient called requesting return call from Ned Card NP with Dr. Benay Spice.  Very Difficult to extract information from her.  Asked if scheduler will call with CT appointments because [name] told her Carrie Page called her saying Dr. Benay Spice has not called the order in.  Will notify Dr. Benay Spice and observed [name]'s note that reads only [facility] providers may order scans at the [facility]. Location.

## 2015-05-22 NOTE — Telephone Encounter (Signed)
Gave and printed appt sched and avs fo rpt for DEC....left message for Jakin to call me to sched ct

## 2015-05-22 NOTE — Progress Notes (Signed)
  [place] OFFICE PROGRESS NOTE   Diagnosis: Colon cancer  INTERVAL HISTORY:   Carrie Page returns as scheduled. She feels well. Good appetite. No difficulty with bowel function.  Objective:  Vital signs in last 24 hours:  Blood pressure 174/73, pulse 65, temperature 97.4 F (36.3 C), temperature source Oral, resp. rate 18, height $RemoveBe'5\' 4"'hHlUtJySa$  (1.626 m), weight 120 lb 4.8 oz (54.568 kg), SpO2 100 %.    HEENT: Neck without mass Lymphatics: No cervical, supra-clavicular, axillary, or inguinal nodes Resp: Lungs clear bilaterally Cardio: Regular rate and rhythm GI: No hepatosplenomegaly, nontender, no mass Vascular: No leg edema   Lab Results:  Lab Results  Component Value Date   WBC 5.0 05/15/2015   HGB 13.4 05/15/2015   HCT 40.3 05/15/2015   MCV 87.7 05/15/2015   PLT 185 05/15/2015   NEUTROABS 2.7 05/15/2015    Lab Results  Component Value Date   CEA 0.7 05/15/2015    Medications: I have reviewed the patient's current medications.  Assessment/Plan: 1. 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   Cycle 2 adjuvant Xeloda 08/08/2014.   Cycle 3 adjuvant Xeloda 08/29/2014.  Cycle 4 adjuvant Xeloda 09/19/2014.  Cycle 5 adjuvant Xeloda 10/10/2014  Cycle 6 adjuvant Xeloda 10/31/2014  Cycle 7 adjuvant Xeloda 11/21/2014  Cycle 8 adjuvant Xeloda 12/13/2014   2. History of iron deficiency anemia 3. Early hand-foot syndrome with mild erythema/tenderness of the palms and soles 10/03/2014    Disposition:  Carrie Page is in clinical remission from colon cancer. She will undergo surveillance CT scans within the next one to 2 weeks. I will contact Dr. Deatra Ina regarding the  indication for a surveillance CT scan.  Betsy Coder, MD  05/22/2015  12:49 PM

## 2015-05-22 NOTE — Telephone Encounter (Signed)
Scans are ordered in [facility] Schedulers can schedule at any [facility] location  Please be sure this is taken care of

## 2015-05-23 ENCOUNTER — Other Ambulatory Visit: Payer: Self-pay

## 2015-05-23 ENCOUNTER — Other Ambulatory Visit: Payer: Self-pay | Admitting: *Deleted

## 2015-05-23 DIAGNOSIS — Z85038 Personal history of other malignant neoplasm of large intestine: Secondary | ICD-10-CM

## 2015-05-23 DIAGNOSIS — C189 Malignant neoplasm of colon, unspecified: Secondary | ICD-10-CM

## 2015-05-23 NOTE — Telephone Encounter (Signed)
Called April with [facility] scheduling with this information from Dr. Benay Spice.  April also sent a staff message to this nurse and [facility] lead scheduler as follows.  Hi Megyn Leng, do I need to get Ms. Zuba scheduled at [facility] for her CT scans? The order location stated [place], I think that is why it did not show up on our WQ.    Melissa, please let all staff know to put the correct ordering location for imaging procedures.   Thanks so much!  April    Advised to schedule at [facility] as order reads if possible as that is patient's preference if [place] allows.

## 2015-05-24 ENCOUNTER — Other Ambulatory Visit: Payer: Self-pay

## 2015-05-25 ENCOUNTER — Other Ambulatory Visit: Payer: Self-pay | Admitting: *Deleted

## 2015-05-25 DIAGNOSIS — C189 Malignant neoplasm of colon, unspecified: Secondary | ICD-10-CM

## 2015-05-26 ENCOUNTER — Other Ambulatory Visit: Payer: Self-pay | Admitting: Oncology

## 2015-05-26 ENCOUNTER — Ambulatory Visit (HOSPITAL_COMMUNITY)
Admission: RE | Admit: 2015-05-26 | Discharge: 2015-05-26 | Disposition: A | Discharge status: Home / Self Care | Payer: Medicare Other | Source: Ambulatory Visit | Attending: Oncology | Admitting: Oncology

## 2015-05-26 DIAGNOSIS — Z9049 Acquired absence of other specified parts of digestive tract: Secondary | ICD-10-CM | POA: Diagnosis not present

## 2015-05-26 DIAGNOSIS — N8110 Cystocele, unspecified: Secondary | ICD-10-CM | POA: Diagnosis not present

## 2015-05-26 DIAGNOSIS — Z90710 Acquired absence of both cervix and uterus: Secondary | ICD-10-CM | POA: Diagnosis not present

## 2015-05-26 DIAGNOSIS — I700 Atherosclerosis of aorta: Secondary | ICD-10-CM | POA: Diagnosis not present

## 2015-05-26 DIAGNOSIS — C189 Malignant neoplasm of colon, unspecified: Secondary | ICD-10-CM

## 2015-05-26 DIAGNOSIS — I2510 Atherosclerotic heart disease of native coronary artery without angina pectoris: Secondary | ICD-10-CM | POA: Diagnosis not present

## 2015-05-26 MED ORDER — IOHEXOL 300 MG/ML  SOLN
100.0000 mL | Freq: Once | INTRAMUSCULAR | Status: AC | PRN
  Administered 2015-05-26: 100 mL via INTRAVENOUS

## 2015-05-29 ENCOUNTER — Telehealth: Payer: Self-pay | Admitting: Gastroenterology

## 2015-05-29 NOTE — Telephone Encounter (Signed)
Patient is on Humira and has been doing well. Last seen here in Nov. 2015. Over the weekend, she began having stool urgency. She took her Humira injection today. She is having about 4 urgent stool a day. Do you want to put her back a prednisone?

## 2015-05-29 NOTE — Telephone Encounter (Signed)
Hold prednisone.  She can take 2 Imodium when necessary.  If she's not better in the next 2-3 days she should call back.

## 2015-05-29 NOTE — Telephone Encounter (Signed)
Error-this is the wrong chart-this patient does not have this issue.

## 2015-06-01 ENCOUNTER — Telehealth: Payer: Self-pay | Admitting: *Deleted

## 2015-06-01 NOTE — Telephone Encounter (Signed)
Per Dr. Benay Spice; notified pt that CTs are negative for cancer, f/u as scheduled.  Pt verbalized understanding and expressed appreciation for call.

## 2015-06-01 NOTE — Telephone Encounter (Signed)
-----   Message from Ladell Pier, MD sent at 05/31/2015  8:24 PM EDT ----- Please call patient, CTs are negative for cancer, f/u as scheduled

## 2015-06-26 ENCOUNTER — Telehealth: Payer: Self-pay

## 2015-06-26 ENCOUNTER — Ambulatory Visit (AMBULATORY_SURGERY_CENTER): Payer: Self-pay | Admitting: *Deleted

## 2015-06-26 VITALS — Ht 64.0 in | Wt 123.0 lb

## 2015-06-26 DIAGNOSIS — Z85038 Personal history of other malignant neoplasm of large intestine: Secondary | ICD-10-CM

## 2015-06-26 MED ORDER — NA SULFATE-K SULFATE-MG SULF 17.5-3.13-1.6 GM/177ML PO SOLN: ORAL | Status: DC

## 2015-06-26 NOTE — Telephone Encounter (Signed)
-----   Message from Carrie Castle, MD sent at 05/23/2015  8:47 AM EDT ----- Yes, we will take care of it. Carrie Page, please schedule colonoscopy ----- Message -----    From: Carrie Pier, MD    Sent: 05/22/2015   6:11 PM      To: Carrie Castle, MD  [facility], will your office contact her to schedule? Thanks,  Brad ----- Message -----    From: Carrie Castle, MD    Sent: 05/22/2015   3:58 PM      To: Carrie Pier, MD  If her other medical problems are stable and she is generally in good shape then I would proceed with colonoscopy ----- Message -----    From: Carrie Pier, MD    Sent: 05/22/2015  12:51 PM      To: Carrie Castle, MD  She is now 1 year out from the colon cancer diagnosis, cecum mass.  She is in clinical remission from stage III colon cancer.  Carrie Page is 81 years old. Should she have a surveillance colonoscopy?  Thanks,  [employer]

## 2015-06-26 NOTE — Progress Notes (Signed)
Patient denies any allergies to eggs or soy. Patient denies any problems with anesthesia/sedation. Patient denies any oxygen use at home and does not take any diet/weight loss medications. Pt declined EMMI information, states I do not have a computer.

## 2015-06-26 NOTE — Telephone Encounter (Signed)
Patient scheduled for colonoscopy 08/01/15

## 2015-07-03 ENCOUNTER — Ambulatory Visit (HOSPITAL_COMMUNITY): Admit: 2015-07-03 | Payer: Self-pay | Admitting: Gastroenterology

## 2015-07-03 ENCOUNTER — Encounter (HOSPITAL_COMMUNITY): Payer: Self-pay

## 2015-07-03 SURGERY — COLONOSCOPY
Anesthesia: Monitor Anesthesia Care

## 2015-07-05 ENCOUNTER — Telehealth: Payer: Self-pay | Admitting: *Deleted

## 2015-07-05 NOTE — Telephone Encounter (Signed)
Notified patient that we have her a Suprep sample available on 4th floor to pick up before 5pm. Pt understands.  Medication Samples have been provided to the patient.  Drug name: Hinda Kehr: 1  LOT: [id number]  Exp.Date: 05/2017

## 2015-08-01 ENCOUNTER — Ambulatory Visit (AMBULATORY_SURGERY_CENTER): Payer: Medicare Other | Admitting: Gastroenterology

## 2015-08-01 ENCOUNTER — Encounter: Payer: Self-pay | Admitting: Gastroenterology

## 2015-08-01 VITALS — BP 141/63 | HR 71 | Temp 97.2°F | Resp 24 | Ht 64.0 in | Wt 123.0 lb

## 2015-08-01 DIAGNOSIS — D128 Benign neoplasm of rectum: Secondary | ICD-10-CM

## 2015-08-01 DIAGNOSIS — Z85038 Personal history of other malignant neoplasm of large intestine: Secondary | ICD-10-CM

## 2015-08-01 DIAGNOSIS — D129 Benign neoplasm of anus and anal canal: Secondary | ICD-10-CM

## 2015-08-01 MED ORDER — SODIUM CHLORIDE 0.9 % IV SOLN: 500.0000 mL | INTRAVENOUS | Status: DC

## 2015-08-01 NOTE — Patient Instructions (Signed)
YOU HAD AN ENDOSCOPIC PROCEDURE TODAY AT [facility]:   Refer to the procedure report that was given to you for any specific questions about what was found during the examination.  If the procedure report does not answer your questions, please call your gastroenterologist to clarify.  If you requested that your care partner not be given the details of your procedure findings, then the procedure report has been included in a sealed envelope for you to review at your convenience later.  YOU SHOULD EXPECT: Some feelings of bloating in the abdomen. Passage of more gas than usual.  Walking can help get rid of the air that was put into your GI tract during the procedure and reduce the bloating. If you had a lower endoscopy (such as a colonoscopy or flexible sigmoidoscopy) you may notice spotting of blood in your stool or on the toilet paper. If you underwent a bowel prep for your procedure, you may not have a normal bowel movement for a few days.  Please Note:  You might notice some irritation and congestion in your nose or some drainage.  This is from the oxygen used during your procedure.  There is no need for concern and it should clear up in a day or so.  SYMPTOMS TO REPORT IMMEDIATELY:   Following lower endoscopy (colonoscopy or flexible sigmoidoscopy):  Excessive amounts of blood in the stool  Significant tenderness or worsening of abdominal pains  Swelling of the abdomen that is new, acute  Fever of 100F or higher  For urgent or emergent issues, a gastroenterologist can be reached at any hour by calling [phone].   DIET: Your first meal following the procedure should be a small meal and then it is ok to progress to your normal diet. Heavy or fried foods are harder to digest and may make you feel nauseous or bloated.  Likewise, meals heavy in dairy and vegetables can increase bloating.  Drink plenty of fluids but you should avoid alcoholic beverages for 24  hours.  ACTIVITY:  You should plan to take it easy for the rest of today and you should NOT DRIVE or use heavy machinery until tomorrow (because of the sedation medicines used during the test).    FOLLOW UP: Our staff will call the number listed on your records the next business day following your procedure to check on you and address any questions or concerns that you may have regarding the information given to you following your procedure. If we do not reach you, we will leave a message.  However, if you are feeling well and you are not experiencing any problems, there is no need to return our call.  We will assume that you have returned to your regular daily activities without incident.  If any biopsies were taken you will be contacted by phone or by letter within the next 1-3 weeks.  Please call us at [phone] if you have not heard about the biopsies in 3 weeks.    SIGNATURES/CONFIDENTIALITY: You and/or your care partner have signed paperwork which will be entered into your electronic medical record.  These signatures attest to the fact that that the information above on your After Visit Summary has been reviewed and is understood.  Full responsibility of the confidentiality of this discharge information lies with you and/or your care-partner.  Polyp handout given Repeat Colonoscopy in 3 years

## 2015-08-01 NOTE — Progress Notes (Signed)
Report to PACU, RN, vss, BBS= Clear.  

## 2015-08-01 NOTE — Progress Notes (Signed)
Called to room to assist during endoscopic procedure.  Patient ID and intended procedure confirmed with present staff. Received instructions for my participation in the procedure from the performing physician.  

## 2015-08-01 NOTE — Op Note (Signed)
[place]  [address] [place], [id number]   COLONOSCOPY PROCEDURE REPORT  PATIENT: Carrie Page, Carrie Page  MR#: [id number] BIRTHDATE: [birth year] , 86  yrs. old GENDER: female ENDOSCOPIST: Inda Castle, MD REFERRED TA:VWPV Edrick Kins, M.D.  Cathlean Cower, M.D. PROCEDURE DATE:  08/01/2015 PROCEDURE:   Colonoscopy, surveillance and Colonoscopy with snare polypectomy First Screening Colonoscopy - Avg.  risk and is 50 yrs.  old or older - No.  Prior Negative Screening - Now for repeat screening. N/A  History of Adenoma - Now for follow-up colonoscopy & has been > or = to 3 yrs.  No.  It has been less than 3 yrs since last colonoscopy.  Other: See Comments  Polyps removed today? Yes ASA CLASS:   Class II INDICATIONS:PH Colon or Rectal Adenocarcinoma. MEDICATIONS: Monitored anesthesia care and Propofol 200 mg IV  DESCRIPTION OF PROCEDURE:   After the risks benefits and alternatives of the procedure were thoroughly explained, informed consent was obtained.  The digital rectal exam revealed no abnormalities of the rectum.   The LB [id number]  endoscope was introduced through the anus and advanced to the surgical anastomosis. No adverse events experienced.   The quality of the prep was (Suprep was used) excellent.  The instrument was then slowly withdrawn as the colon was fully examined. Estimated blood loss is zero unless otherwise noted in this procedure report.      COLON FINDINGS: A sessile polyp measuring 3 mm in size was found in the rectum.  A polypectomy was performed using snare cautery.  The resection was complete, the polyp tissue was completely retrieved and sent to histology.   The examination was otherwise normal. Retroflexed views revealed no abnormalities. The time to cecum = 3.2 Withdrawal time = 6.8   The scope was withdrawn and the procedure completed. COMPLICATIONS: There were no immediate complications.  ENDOSCOPIC IMPRESSION: 1.    Sessile polyp was found in the rectum; polypectomy was performed using snare cautery 2.   The examination was otherwise normal  RECOMMENDATIONS: Repeat Colonoscopy in 3 years pending overall medical status  eSigned:  Inda Castle, MD 08/01/2015 2:31 PM   cc:

## 2015-08-02 ENCOUNTER — Telehealth: Payer: Self-pay | Admitting: *Deleted

## 2015-08-02 NOTE — Telephone Encounter (Signed)
  Follow up Call-  Call back number 08/01/2015 05/03/2014  Post procedure Call Back phone  # [phone]  Permission to leave phone message Yes Yes     Patient questions:  Do you have a fever, pain , or abdominal swelling? No. Pain Score  0 *  Have you tolerated food without any problems? Yes.    Have you been able to return to your normal activities? Yes.    Do you have any questions about your discharge instructions: Diet   No. Medications  No. Follow up visit  No.  Do you have questions or concerns about your Care? Yes.    Actions: * If pain score is 4 or above: No action needed, pain <4.

## 2015-08-09 ENCOUNTER — Telehealth: Payer: Self-pay | Admitting: *Deleted

## 2015-08-09 NOTE — Telephone Encounter (Signed)
Message from pt asking if we had rescheduled her appointment to Sept. She received a call from a doctor's office, she's unsure of which. Informed her we have not changed any appointments. Dec appt confirmed. She voiced understanding.

## 2015-08-22 ENCOUNTER — Encounter: Payer: Self-pay | Admitting: Gastroenterology

## 2015-08-29 DIAGNOSIS — C182 Malignant neoplasm of ascending colon: Secondary | ICD-10-CM | POA: Diagnosis not present

## 2015-09-07 ENCOUNTER — Other Ambulatory Visit: Payer: Medicare Other

## 2015-09-07 ENCOUNTER — Encounter: Payer: Self-pay | Admitting: Internal Medicine

## 2015-09-07 ENCOUNTER — Ambulatory Visit (INDEPENDENT_AMBULATORY_CARE_PROVIDER_SITE_OTHER): Payer: Medicare Other | Admitting: Internal Medicine

## 2015-09-07 VITALS — BP 130/70 | HR 71 | Temp 98.0°F | Ht 64.0 in | Wt 122.0 lb

## 2015-09-07 DIAGNOSIS — Z23 Encounter for immunization: Secondary | ICD-10-CM

## 2015-09-07 DIAGNOSIS — R300 Dysuria: Secondary | ICD-10-CM | POA: Diagnosis not present

## 2015-09-07 DIAGNOSIS — R7302 Impaired glucose tolerance (oral): Secondary | ICD-10-CM | POA: Diagnosis not present

## 2015-09-07 DIAGNOSIS — I10 Essential (primary) hypertension: Secondary | ICD-10-CM | POA: Diagnosis not present

## 2015-09-07 LAB — POCT URINALYSIS DIPSTICK
Bilirubin, UA: NEGATIVE
GLUCOSE UA: NEGATIVE
Ketones, UA: NEGATIVE
NITRITE UA: NEGATIVE
Protein, UA: NEGATIVE
RBC UA: 10
Spec Grav, UA: 1.025
UROBILINOGEN UA: 0.2
pH, UA: 6

## 2015-09-07 MED ORDER — SULFAMETHOXAZOLE-TRIMETHOPRIM 800-160 MG PO TABS: 1.0000 | ORAL_TABLET | Freq: Two times a day (BID) | ORAL | Status: DC

## 2015-09-07 NOTE — Progress Notes (Signed)
Pre visit review using our clinic review tool, if applicable. No additional management support is needed unless otherwise documented below in the visit note. 

## 2015-09-07 NOTE — Assessment & Plan Note (Signed)
Likely UTI uncomplicated by hx and exam, UA dip +, for septra ds (allergy to pcn and macrobid, and does not want cipro as it put a friend of hers in the hosp), urine cx to be sent,  to f/u any worsening symptoms or concerns

## 2015-09-07 NOTE — Progress Notes (Signed)
Subjective:    Patient ID: Carrie Page, female    DOB: August 17, 1928, 81 y.o.   MRN: [id number]  HPI Here with 2-3 days onset dysuria, frequency, and urgency, without flank pain, hematuria or n/v, fever, chills.  Pt denies chest pain, increased sob or doe, wheezing, orthopnea, PND, increased LE swelling, palpitations, dizziness or syncope.  Pt denies new neurological symptoms such as new headache, or facial or extremity weakness or numbness   Pt denies polydipsia, polyuria Past Medical History  Diagnosis Date  . Impaired glucose tolerance   . Arthritis   . Allergy   . Allergic rhinitis, cause unspecified 01/28/2012  . Degenerative joint disease 01/28/2012  . Hypertension   . Depression   . Anxiety 01/28/2012  . Anemia   . PONV (postoperative nausea and vomiting)   . Osteoporosis     RT SHOULDER  . History of nonmelanoma skin cancer   . Cecal cancer    Past Surgical History  Procedure Laterality Date  . Abdominal hysterectomy    . Appendectomy    . Tonsillectomy    . Cholecystectomy    . Back surgery  2010    MICRODCISCECTOMY  . Laparoscopic partial colectomy N/A 06/20/2014    Procedure: LAPAROSCOPIC ASSISTED RIGHT PARTIAL COLECTOMY;  Surgeon: Leighton Ruff, MD;  Location: [facility];  Service: General;  Laterality: N/A;    reports that she has never smoked. She has never used smokeless tobacco. She reports that she does not drink alcohol or use illicit drugs. family history includes Arthritis in her other; Colon cancer in her father; Heart disease in her other; Stroke in her other. Allergies  Allergen Reactions  . Lovastatin Other (See Comments)    Cognitive slowing and myalgias  . Nitrofurantoin Other (See Comments)    GI upset  . Penicillins Itching  . Pravastatin Other (See Comments)    Myalgias, increased dreams   Current Outpatient Prescriptions on File Prior to Visit  Medication Sig Dispense Refill  . aspirin EC 81 MG tablet Take 81 mg by mouth daily.    .  Cholecalciferol (VITAMIN D3) 1000 UNITS CAPS Take 1 capsule by mouth daily.    . Cyanocobalamin (VITAMIN B 12 PO) Take 1 tablet by mouth daily.    . irbesartan (AVAPRO) 300 MG tablet Take 1 tablet (300 mg total) by mouth every morning. 90 tablet 3   No current facility-administered medications on file prior to visit.   Review of Systems  Constitutional: Negative for unusual diaphoresis or night sweats HENT: Negative for ringing in ear or discharge Eyes: Negative for double vision or worsening visual disturbance.  Respiratory: Negative for choking and stridor.   Gastrointestinal: Negative for vomiting or other signifcant bowel change Genitourinary: Negative for hematuria or change in urine volume.  Musculoskeletal: Negative for other MSK pain or swelling Skin: Negative for color change and worsening wound.  Neurological: Negative for tremors and numbness other than noted  Psychiatric/Behavioral: Negative for decreased concentration or agitation other than above       Objective:   Physical Exam BP 130/70 mmHg  Pulse 71  Temp(Src) 98 F (36.7 C) (Oral)  Ht 5\' 4"  (1.626 m)  Wt 122 lb (55.339 kg)  BMI 20.93 kg/m2  SpO2 98% VS noted,  Constitutional: Pt appears in no significant distress HENT: Head: NCAT.  Right Ear: External ear normal.  Left Ear: External ear normal.  Eyes: . Pupils are equal, round, and reactive to light. Conjunctivae and EOM are normal Neck: Normal  range of motion. Neck supple.  Cardiovascular: Normal rate and regular rhythm.   Pulmonary/Chest: Effort normal and breath sounds without rales or wheezing.  Abd:  Soft, ND, + BS with low mid abd tender, no guarding or rebound Neurological: Pt is alert. Not confused , motor grossly intact Skin: Skin is warm. No rash, no LE edema Psychiatric: Pt behavior is normal. No agitation.      Assessment & Plan:

## 2015-09-07 NOTE — Assessment & Plan Note (Signed)
stable overall by history and exam, recent data reviewed with pt, and pt to continue medical treatment as before,  to f/u any worsening symptoms or concerns BP Readings from Last 3 Encounters:  09/07/15 130/70  08/01/15 141/63  05/22/15 174/73

## 2015-09-07 NOTE — Assessment & Plan Note (Signed)
stable overall by history and exam, recent data reviewed with pt, and pt to continue medical treatment as before,  to f/u any worsening symptoms or concerns Lab Results  Component Value Date   HGBA1C 5.9 03/16/2015   

## 2015-09-07 NOTE — Patient Instructions (Addendum)
You had the flu shot today  Please take all new medication as prescribed - the antibiotic  The specimen will be sent for culture as well, to make sure the antibiotic is appropriate  Please continue all other medications as before, and refills have been done if requested.  Please have the pharmacy call with any other refills you may need.  Please keep your appointments with your specialists as you may have planned

## 2015-09-08 LAB — URINE CULTURE
COLONY COUNT: NO GROWTH
Organism ID, Bacteria: NO GROWTH

## 2015-10-27 ENCOUNTER — Telehealth: Payer: Self-pay | Admitting: Internal Medicine

## 2015-10-27 MED ORDER — IRBESARTAN 300 MG PO TABS: 300.0000 mg | ORAL_TABLET | Freq: Every morning | ORAL | Status: DC

## 2015-10-27 NOTE — Telephone Encounter (Signed)
Pt requesting refill for irbesartan (AVAPRO) 300 MG tablet [id number] 90 day supply [facility] [address]

## 2015-11-10 ENCOUNTER — Telehealth: Payer: Self-pay | Admitting: Oncology

## 2015-11-10 NOTE — Telephone Encounter (Signed)
Due to LT out per BS moved f/u 6 weeks out. Spoke with patient re new date/time for 12/25/15.

## 2015-11-13 ENCOUNTER — Ambulatory Visit: Payer: Medicare Other | Admitting: Nurse Practitioner

## 2015-11-13 ENCOUNTER — Other Ambulatory Visit: Payer: Medicare Other

## 2015-12-25 ENCOUNTER — Other Ambulatory Visit (HOSPITAL_BASED_OUTPATIENT_CLINIC_OR_DEPARTMENT_OTHER): Payer: Medicare Other

## 2015-12-25 ENCOUNTER — Ambulatory Visit (HOSPITAL_BASED_OUTPATIENT_CLINIC_OR_DEPARTMENT_OTHER): Payer: Medicare Other | Admitting: Nurse Practitioner

## 2015-12-25 ENCOUNTER — Telehealth: Payer: Self-pay | Admitting: Oncology

## 2015-12-25 VITALS — BP 167/68 | HR 72 | Temp 98.6°F | Resp 18 | Ht 64.0 in | Wt 124.7 lb

## 2015-12-25 DIAGNOSIS — C189 Malignant neoplasm of colon, unspecified: Secondary | ICD-10-CM

## 2015-12-25 DIAGNOSIS — C180 Malignant neoplasm of cecum: Secondary | ICD-10-CM | POA: Diagnosis not present

## 2015-12-25 DIAGNOSIS — D509 Iron deficiency anemia, unspecified: Secondary | ICD-10-CM

## 2015-12-25 NOTE — Progress Notes (Signed)
  [place] OFFICE PROGRESS NOTE   Diagnosis:  Colon cancer  INTERVAL HISTORY:   Ms. Carrie Page returns as scheduled. She feels well. No change in bowel habits. No bloody or black stools. No pain with bowel movements. No abdominal pain.  Objective:  Vital signs in last 24 hours:  Blood pressure 167/68, pulse 72, temperature 98.6 F (37 C), temperature source Oral, resp. rate 18, height '5\' 4"'$  (1.626 m), weight 124 lb 11.2 oz (56.564 kg), SpO2 100 %.    HEENT: No thrush or ulcers. Lymphatics: No palpable cervical, supra clavicular, axillary or inguinal lymph nodes. Resp: Lungs clear bilaterally. Cardio: Regular rate and rhythm. GI: Abdomen soft and nontender. No hepatomegaly. No mass. Vascular: No leg edema.   Lab Results:  Lab Results  Component Value Date   WBC 5.0 05/15/2015   HGB 13.4 05/15/2015   HCT 40.3 05/15/2015   MCV 87.7 05/15/2015   PLT 185 05/15/2015   NEUTROABS 2.7 05/15/2015   CEA pending. Imaging:  No results found.  Medications: I have reviewed the patient's current medications.  Assessment/Plan: 1. 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   Cycle 2 adjuvant Xeloda 08/08/2014.   Cycle 3 adjuvant Xeloda 08/29/2014.  Cycle 4 adjuvant Xeloda 09/19/2014.  Cycle 5 adjuvant Xeloda 10/10/2014  Cycle 6 adjuvant Xeloda 10/31/2014  Cycle 7 adjuvant Xeloda 11/21/2014  Cycle 8 adjuvant Xeloda 12/13/2014  Surveillance CT scans 05/26/2015 with no evidence of metastatic disease.  Colonoscopy 08/01/2015. Sessile polyp was found in the rectum; polypectomy was performed using snare cautery--tubular adenoma. Examination otherwise normal. Next colonoscopy at a 3  year interval. 2. History of iron deficiency anemia 3. Early hand-foot syndrome with mild erythema/tenderness of the palms and soles 10/03/2014   Disposition: Carrie Page remains in clinical remission from colon cancer. We will follow-up on the CEA from today. She will have surveillance CT scans in 6 months with a follow-up visit 2-3 days later to review the results. She will contact the office in the interim with any problems.    Ned Card ANP/GNP-BC   12/25/2015  11:38 AM

## 2015-12-25 NOTE — Telephone Encounter (Signed)
Talked to patient here in office. Scheduled appt.       [facility]. °

## 2015-12-26 LAB — CEA: CEA: 1.2 ng/mL (ref 0.0–4.7)

## 2015-12-27 ENCOUNTER — Telehealth: Payer: Self-pay | Admitting: *Deleted

## 2015-12-27 NOTE — Telephone Encounter (Signed)
Per Dr. Sherrill; left voice message that cea is normal; call office if questions. 

## 2015-12-27 NOTE — Telephone Encounter (Signed)
-----   Message from Ladell Pier, MD sent at 12/26/2015  8:31 PM EST ----- Please call patient, cea is normal

## 2016-03-18 ENCOUNTER — Telehealth: Payer: Self-pay | Admitting: Nurse Practitioner

## 2016-03-18 NOTE — Telephone Encounter (Signed)
Return TC to Pt. To inform her that its to early to schedule Ct scan per [facility] at [facility] her to call back end of May beginning of June to Schedule and after she Schedule's appointment she can come pick up the Contrast from the [facility].Pt. Verbalized understanding. No further questions or Concerns at this time.

## 2016-03-18 NOTE — Telephone Encounter (Signed)
TC from Pt. To inquire about CT scan appointment for July 17,2017 Pt. Wanted to know if she could schedule appointment and where would she pick up the contrast. Informed Pt. That I would inquire to scheduling if it was time to schedule this CT scan. Will return call

## 2016-03-19 ENCOUNTER — Encounter: Payer: Self-pay | Admitting: Internal Medicine

## 2016-03-19 ENCOUNTER — Other Ambulatory Visit (INDEPENDENT_AMBULATORY_CARE_PROVIDER_SITE_OTHER): Payer: Medicare Other

## 2016-03-19 ENCOUNTER — Ambulatory Visit (INDEPENDENT_AMBULATORY_CARE_PROVIDER_SITE_OTHER): Payer: Medicare Other | Admitting: Internal Medicine

## 2016-03-19 VITALS — BP 138/82 | HR 76 | Temp 98.4°F | Resp 20 | Wt 125.0 lb

## 2016-03-19 DIAGNOSIS — R6889 Other general symptoms and signs: Secondary | ICD-10-CM | POA: Diagnosis not present

## 2016-03-19 DIAGNOSIS — R7302 Impaired glucose tolerance (oral): Secondary | ICD-10-CM

## 2016-03-19 DIAGNOSIS — Z0000 Encounter for general adult medical examination without abnormal findings: Secondary | ICD-10-CM | POA: Diagnosis not present

## 2016-03-19 DIAGNOSIS — M25511 Pain in right shoulder: Secondary | ICD-10-CM | POA: Diagnosis not present

## 2016-03-19 DIAGNOSIS — F419 Anxiety disorder, unspecified: Secondary | ICD-10-CM

## 2016-03-19 DIAGNOSIS — R300 Dysuria: Secondary | ICD-10-CM | POA: Diagnosis not present

## 2016-03-19 DIAGNOSIS — Z0001 Encounter for general adult medical examination with abnormal findings: Secondary | ICD-10-CM

## 2016-03-19 DIAGNOSIS — I10 Essential (primary) hypertension: Secondary | ICD-10-CM

## 2016-03-19 DIAGNOSIS — Z23 Encounter for immunization: Secondary | ICD-10-CM

## 2016-03-19 DIAGNOSIS — E785 Hyperlipidemia, unspecified: Secondary | ICD-10-CM

## 2016-03-19 LAB — BASIC METABOLIC PANEL WITH GFR
BUN: 22 mg/dL (ref 6–23)
CO2: 29 meq/L (ref 19–32)
Calcium: 9.8 mg/dL (ref 8.4–10.5)
Chloride: 103 meq/L (ref 96–112)
Creatinine, Ser: 0.61 mg/dL (ref 0.40–1.20)
GFR: 98.98 mL/min
Glucose, Bld: 106 mg/dL — ABNORMAL HIGH (ref 70–99)
Potassium: 4.5 meq/L (ref 3.5–5.1)
Sodium: 138 meq/L (ref 135–145)

## 2016-03-19 LAB — HEPATIC FUNCTION PANEL
ALBUMIN: 4.4 g/dL (ref 3.5–5.2)
ALT: 14 U/L (ref 0–35)
AST: 21 U/L (ref 0–37)
Alkaline Phosphatase: 87 U/L (ref 39–117)
BILIRUBIN TOTAL: 0.6 mg/dL (ref 0.2–1.2)
Bilirubin, Direct: 0.1 mg/dL (ref 0.0–0.3)
TOTAL PROTEIN: 7.6 g/dL (ref 6.0–8.3)

## 2016-03-19 LAB — LIPID PANEL
Cholesterol: 218 mg/dL — ABNORMAL HIGH (ref 0–200)
HDL: 71.4 mg/dL
LDL Cholesterol: 116 mg/dL — ABNORMAL HIGH (ref 0–99)
NonHDL: 146.56
Total CHOL/HDL Ratio: 3
Triglycerides: 152 mg/dL — ABNORMAL HIGH (ref 0.0–149.0)
VLDL: 30.4 mg/dL (ref 0.0–40.0)

## 2016-03-19 LAB — CBC WITH DIFFERENTIAL/PLATELET
Basophils Absolute: 0.1 K/uL (ref 0.0–0.1)
Basophils Relative: 1.2 % (ref 0.0–3.0)
Eosinophils Absolute: 0.2 K/uL (ref 0.0–0.7)
Eosinophils Relative: 3.5 % (ref 0.0–5.0)
HCT: 39.8 % (ref 36.0–46.0)
Hemoglobin: 13.3 g/dL (ref 12.0–15.0)
Lymphocytes Relative: 35.9 % (ref 12.0–46.0)
Lymphs Abs: 2.4 K/uL (ref 0.7–4.0)
MCHC: 33.4 g/dL (ref 30.0–36.0)
MCV: 87.5 fl (ref 78.0–100.0)
Monocytes Absolute: 0.5 K/uL (ref 0.1–1.0)
Monocytes Relative: 6.9 % (ref 3.0–12.0)
Neutro Abs: 3.5 K/uL (ref 1.4–7.7)
Neutrophils Relative %: 52.5 % (ref 43.0–77.0)
Platelets: 203 K/uL (ref 150.0–400.0)
RBC: 4.55 Mil/uL (ref 3.87–5.11)
RDW: 13.7 % (ref 11.5–15.5)
WBC: 6.6 K/uL (ref 4.0–10.5)

## 2016-03-19 LAB — URINALYSIS, ROUTINE W REFLEX MICROSCOPIC
Bilirubin Urine: NEGATIVE
KETONES UR: NEGATIVE
NITRITE: NEGATIVE
SPECIFIC GRAVITY, URINE: 1.015 (ref 1.000–1.030)
TOTAL PROTEIN, URINE-UPE24: NEGATIVE
URINE GLUCOSE: NEGATIVE
UROBILINOGEN UA: 0.2 (ref 0.0–1.0)
pH: 5.5 (ref 5.0–8.0)

## 2016-03-19 LAB — HEMOGLOBIN A1C: HEMOGLOBIN A1C: 6.2 % (ref 4.6–6.5)

## 2016-03-19 LAB — TSH: TSH: 1.8 u[IU]/mL (ref 0.35–4.50)

## 2016-03-19 MED ORDER — NITROFURANTOIN MONOHYD MACRO 100 MG PO CAPS: 100.0000 mg | ORAL_CAPSULE | Freq: Two times a day (BID) | ORAL | Status: DC

## 2016-03-19 NOTE — Progress Notes (Signed)
Subjective:    Patient ID: Carrie Page, female    DOB: January 24, 1928, 82 y.o.   MRN: [id number]  HPI  Here for wellness and f/u;  Overall doing ok;  Pt denies Chest pain, worsening SOB, DOE, wheezing, orthopnea, PND, worsening LE edema, palpitations, dizziness or syncope.  Pt denies neurological change such as new headache, facial or extremity weakness.  Pt denies polydipsia, polyuria, or low sugar symptoms. Pt states overall good compliance with treatment and medications, good tolerability, and has been trying to follow appropriate diet.  Pt denies worsening depressive symptoms, suicidal ideation or panic. No fever, night sweats, wt loss, loss of appetite, or other constitutional symptoms.  Pt states good ability with ADL's, has low fall risk, home safety reviewed and adequate, no other significant changes in hearing or vision, and occasionally active with exercise. Asks for prn valium for anxiety episodes, plans to take even only 1 per wk or less. Mentions today she did have a rash to an antibx for UTI.  Has had 1 wk very mild urinary symptoms such as dysuria, frequency, but no urgency, flank pain, hematuria or n/v, fever, chills - due for UA to be done today.  Also c/o right shoulder pain for 20 yrs, no worse, just not getting better Past Medical History  Diagnosis Date  . Impaired glucose tolerance   . Arthritis   . Allergy   . Allergic rhinitis, cause unspecified 01/28/2012  . Degenerative joint disease 01/28/2012  . Hypertension   . Depression   . Anxiety 01/28/2012  . Anemia   . PONV (postoperative nausea and vomiting)   . Osteoporosis     RT SHOULDER  . History of nonmelanoma skin cancer   . Cecal cancer [facility])    Past Surgical History  Procedure Laterality Date  . Abdominal hysterectomy    . Appendectomy    . Tonsillectomy    . Cholecystectomy    . Back surgery  2010    MICRODCISCECTOMY  . Laparoscopic partial colectomy N/A 06/20/2014    Procedure: LAPAROSCOPIC ASSISTED RIGHT  PARTIAL COLECTOMY;  Surgeon: Leighton Ruff, MD;  Location: [facility];  Service: General;  Laterality: N/A;    reports that she has never smoked. She has never used smokeless tobacco. She reports that she does not drink alcohol or use illicit drugs. family history includes Arthritis in her other; Colon cancer in her father; Heart disease in her other; Stroke in her other. Allergies  Allergen Reactions  . Bactrim [Sulfamethoxazole-Trimethoprim] Itching, Rash and Other (See Comments)    Rash, itching, diaphoretic  . Lovastatin Other (See Comments)    Cognitive slowing and myalgias  . Nitrofurantoin Other (See Comments)    GI upset  . Penicillins Itching  . Pravastatin Other (See Comments)    Myalgias, increased dreams   Current Outpatient Prescriptions on File Prior to Visit  Medication Sig Dispense Refill  . aspirin EC 81 MG tablet Take 81 mg by mouth daily.    . Cholecalciferol (VITAMIN D3) 1000 UNITS CAPS Take 1 capsule by mouth daily.    . Cyanocobalamin (VITAMIN B 12 PO) Take 1 tablet by mouth daily.    . irbesartan (AVAPRO) 300 MG tablet Take 1 tablet (300 mg total) by mouth every morning. 90 tablet 3   No current facility-administered medications on file prior to visit.    Review of Systems Constitutional: Negative for increased diaphoresis, or other activity, appetite or siginficant weight change other than noted HENT: Negative for worsening hearing loss,  ear pain, facial swelling, mouth sores and neck stiffness.   Eyes: Negative for other worsening pain, redness or visual disturbance.  Respiratory: Negative for choking or stridor Cardiovascular: Negative for other chest pain and palpitations.  Gastrointestinal: Negative for worsening diarrhea, blood in stool, or abdominal distention Genitourinary: Negative for hematuria, flank pain or change in urine volume.  Musculoskeletal: Negative for myalgias or other joint complaints.  Skin: Negative for other color change and wound or  drainage.  Neurological: Negative for syncope and numbness. other than noted Hematological: Negative for adenopathy. or other swelling Psychiatric/Behavioral: Negative for hallucinations, SI, self-injury, decreased concentration or other worsening agitation.      Objective:   Physical Exam BP 138/82 mmHg  Pulse 76  Temp(Src) 98.4 F (36.9 C) (Oral)  Resp 20  Wt 125 lb (56.7 kg)  SpO2 98% VS noted,  Constitutional: Pt is oriented to person, place, and time. Appears well-developed and well-nourished, in no significant distress Head: Normocephalic and atraumatic  Eyes: Conjunctivae and EOM are normal. Pupils are equal, round, and reactive to light Right Ear: External ear normal.  Left Ear: External ear normal Nose: Nose normal.  Mouth/Throat: Oropharynx is clear and moist  Neck: Normal range of motion. Neck supple. No JVD present. No tracheal deviation present or significant neck LA or mass Cardiovascular: Normal rate, regular rhythm, normal heart sounds and intact distal pulses.   Pulmonary/Chest: Effort normal and breath sounds without rales or wheezing  Abdominal: Soft. Bowel sounds are normal.. No HSM, has mild low mid abd tender , no flank tender Musculoskeletal: Normal range of motion. Exhibits no edema Lymphadenopathy: Has no cervical adenopathy.  Neurological: Pt is alert and oriented to person, place, and time. Pt has normal reflexes. No cranial nerve deficit. Motor grossly intact Skin: Skin is warm and dry. No rash noted or new ulcers Psychiatric:  Has normal mood and affect. Behavior is normal.     Assessment & Plan:

## 2016-03-19 NOTE — Progress Notes (Signed)
Pre visit review using our clinic review tool, if applicable. No additional management support is needed unless otherwise documented below in the visit note. 

## 2016-03-19 NOTE — Patient Instructions (Signed)
You had the Tdap (tetanus shot) today  Please take all new medication as prescribed - the macrobid (antibiotic)  Please continue all other medications as before, and refills have been done if requested.  Please have the pharmacy call with any other refills you may need.  Please continue your efforts at being more active, low cholesterol diet, and weight control.  You are otherwise up to date with prevention measures today.  Please consider making an appt with Dr Tamala Julian in this office for the right shoulder if the pain persists  Please keep your appointments with your specialists as you may have planned  Please go to the LAB in the Basement (turn left off the elevator) for the tests to be done today  You will be contacted by phone if any changes need to be made immediately.  Otherwise, you will receive a letter about your results with an explanation, but please check with MyChart first.  Please remember to sign up for MyChart if you have not done so, as this will be important to you in the future with finding out test results, communicating by private email, and scheduling acute appointments online when needed.  Please return in 1 year for your yearly visit, or sooner if needed, with Lab testing done 3-5 days before

## 2016-03-20 ENCOUNTER — Telehealth: Payer: Self-pay | Admitting: Internal Medicine

## 2016-03-20 MED ORDER — CEPHALEXIN 500 MG PO CAPS: 500.0000 mg | ORAL_CAPSULE | Freq: Four times a day (QID) | ORAL | Status: DC

## 2016-03-20 NOTE — Telephone Encounter (Signed)
Ok to change the macrobid to cephalexin, should be ok as there was no rash with PCN allergy

## 2016-03-20 NOTE — Telephone Encounter (Signed)
Patient Name: AHLIA SCULLIN DOB: 05/25/1928 Initial Comment Caller states she was seen yesterday for a UTI. The medication she was given she is allergic to. Nurse Assessment Nurse: Vallery Sa, RN, Cathy Date/Time (Eastern Time): 03/20/2016 2:15:39 PM Confirm and document reason for call. If symptomatic, describe symptoms. You must click the next button to save text entered. ---Caller states she was diagnosed with an UTI yesterday and she noticed that the antibiotic she picked up is one she is allergic to. She asks if the MD can call in another generic medication (not Cipro). Her symptoms are improving. No symptoms today. Encouraged to call back as needed. Has the patient traveled out of the country within the last 30 days? ---Not Applicable Does the patient have any new or worsening symptoms? ---No Guidelines Guideline Title Affirmed Question Affirmed Notes Final Disposition User Comments Rosha is aware that we will send a message to the office. Called the office backline and notified Lovena Le.

## 2016-03-20 NOTE — Telephone Encounter (Signed)
Pt called to check up on this. Please call her back today if possible

## 2016-03-20 NOTE — Telephone Encounter (Signed)
Triage Nurse called stating that Macrobid was sent in to her pharmacy. She is unable to take this medication. She has taken this in the past and it caused a rash.

## 2016-03-21 NOTE — Telephone Encounter (Signed)
Spoke with pt to inform.  

## 2016-03-23 NOTE — Assessment & Plan Note (Signed)

## 2016-03-23 NOTE — Assessment & Plan Note (Signed)
stable overall by history and exam, and pt to continue medical treatment as before,  to f/u any worsening symptoms or concerns, ok or valium prn to use sparingly

## 2016-03-23 NOTE — Assessment & Plan Note (Signed)
Lab Results  Component Value Date   LDLCALC 116* 03/19/2016   D/w pt, declines statin, for lower chol diet,  to f/u any worsening symptoms or concerns

## 2016-03-23 NOTE — Assessment & Plan Note (Addendum)
Exam c/w prob uti, for macrobid course, urine studes,  to f/u any worsening symptoms or concerns  In addition to the time spent performing CPE, I spent an additional 40 minutes face to face,in which greater than 50% of this time was spent in counseling and coordination of care for patient's acute illness as documented.

## 2016-03-23 NOTE — Assessment & Plan Note (Signed)
D/w pt, to consider f/u with sports medicine at this office, to f/u any worsening symptoms or concerns

## 2016-03-23 NOTE — Assessment & Plan Note (Signed)
Asympt, stable overall by history and exam, recent data reviewed with pt, and pt to continue medical treatment as before,  to f/u any worsening symptoms or concerns Lab Results  Component Value Date   HGBA1C 6.2 03/19/2016

## 2016-03-23 NOTE — Assessment & Plan Note (Signed)
stable overall by history and exam, recent data reviewed with pt, and pt to continue medical treatment as before,  to f/u any worsening symptoms or concerns BP Readings from Last 3 Encounters:  03/19/16 138/82  12/25/15 167/68  09/07/15 130/70

## 2016-05-07 ENCOUNTER — Ambulatory Visit (INDEPENDENT_AMBULATORY_CARE_PROVIDER_SITE_OTHER): Payer: Medicare Other | Admitting: Internal Medicine

## 2016-05-07 ENCOUNTER — Other Ambulatory Visit (INDEPENDENT_AMBULATORY_CARE_PROVIDER_SITE_OTHER): Payer: Medicare Other

## 2016-05-07 ENCOUNTER — Encounter: Payer: Self-pay | Admitting: Internal Medicine

## 2016-05-07 VITALS — BP 138/80 | HR 74 | Temp 98.1°F | Resp 20 | Wt 128.0 lb

## 2016-05-07 DIAGNOSIS — C189 Malignant neoplasm of colon, unspecified: Secondary | ICD-10-CM

## 2016-05-07 DIAGNOSIS — R300 Dysuria: Secondary | ICD-10-CM

## 2016-05-07 DIAGNOSIS — Z0000 Encounter for general adult medical examination without abnormal findings: Secondary | ICD-10-CM | POA: Diagnosis not present

## 2016-05-07 DIAGNOSIS — R1900 Intra-abdominal and pelvic swelling, mass and lump, unspecified site: Secondary | ICD-10-CM

## 2016-05-07 DIAGNOSIS — F419 Anxiety disorder, unspecified: Secondary | ICD-10-CM

## 2016-05-07 LAB — BASIC METABOLIC PANEL
BUN: 19 mg/dL (ref 6–23)
CALCIUM: 9.8 mg/dL (ref 8.4–10.5)
CO2: 29 mEq/L (ref 19–32)
Chloride: 103 mEq/L (ref 96–112)
Creatinine, Ser: 0.63 mg/dL (ref 0.40–1.20)
GFR: 95.33 mL/min (ref >60.00)
Glucose, Bld: 116 mg/dL — ABNORMAL HIGH (ref 70–99)
Potassium: 4.2 mEq/L (ref 3.5–5.1)
SODIUM: 138 meq/L (ref 135–145)

## 2016-05-07 LAB — POCT URINALYSIS DIPSTICK
Bilirubin, UA: NEGATIVE
Glucose, UA: NEGATIVE
Ketones, UA: NEGATIVE
NITRITE UA: NEGATIVE
PH UA: 5.5
PROTEIN UA: NEGATIVE
Spec Grav, UA: 1.025
UROBILINOGEN UA: NEGATIVE

## 2016-05-07 LAB — URINALYSIS, ROUTINE W REFLEX MICROSCOPIC
Bilirubin Urine: NEGATIVE
Ketones, ur: NEGATIVE
Nitrite: NEGATIVE
Specific Gravity, Urine: 1.02 (ref 1.000–1.030)
Total Protein, Urine: NEGATIVE
URINE GLUCOSE: NEGATIVE
UROBILINOGEN UA: 0.2 (ref 0.0–1.0)
pH: 5.5 (ref 5.0–8.0)

## 2016-05-07 LAB — CBC WITH DIFFERENTIAL/PLATELET
BASOS ABS: 0.1 10*3/uL (ref 0.0–0.1)
Basophils Relative: 0.7 % (ref 0.0–3.0)
EOS ABS: 0.3 10*3/uL (ref 0.0–0.7)
EOS PCT: 3.9 % (ref 0.0–5.0)
HCT: 38.8 % (ref 36.0–46.0)
HEMOGLOBIN: 13.1 g/dL (ref 12.0–15.0)
LYMPHS ABS: 2.7 10*3/uL (ref 0.7–4.0)
Lymphocytes Relative: 35.9 % (ref 12.0–46.0)
MCHC: 33.7 g/dL (ref 30.0–36.0)
MCV: 87.6 fl (ref 78.0–100.0)
MONO ABS: 0.5 10*3/uL (ref 0.1–1.0)
Monocytes Relative: 6.2 % (ref 3.0–12.0)
NEUTROS PCT: 53.3 % (ref 43.0–77.0)
Neutro Abs: 4 10*3/uL (ref 1.4–7.7)
Platelets: 213 10*3/uL (ref 150.0–400.0)
RBC: 4.43 Mil/uL (ref 3.87–5.11)
RDW: 13.7 % (ref 11.5–15.5)
WBC: 7.5 10*3/uL (ref 4.0–10.5)

## 2016-05-07 LAB — HEPATIC FUNCTION PANEL
ALK PHOS: 84 U/L (ref 39–117)
ALT: 13 U/L (ref 0–35)
AST: 19 U/L (ref 0–37)
Albumin: 4.5 g/dL (ref 3.5–5.2)
BILIRUBIN DIRECT: 0.1 mg/dL (ref 0.0–0.3)
BILIRUBIN TOTAL: 0.4 mg/dL (ref 0.2–1.2)
Total Protein: 7.4 g/dL (ref 6.0–8.3)

## 2016-05-07 MED ORDER — DIAZEPAM 5 MG PO TABS: 5.0000 mg | ORAL_TABLET | Freq: Every day | ORAL | Status: DC | PRN

## 2016-05-07 NOTE — Assessment & Plan Note (Signed)
Udip with + leu but all other exam benign, afeb, for cbc with labs today but for urine cx, hold antibx pending results for now

## 2016-05-07 NOTE — Progress Notes (Signed)
Subjective:    Patient ID: Carrie Page, female    DOB: [birth year], 82 y.o.   MRN: [id number]  HPI  Here with c/o dysuria, "never really got over the April UTi" though urine cx was not done, did finish cephalexin course.  Has had intermittent dysuria since then, minor without assoc symtpoms, and indeed in the 2-3 days has resolved, but decided to present to see if needs further evaluation.  Has been doing the cranberry juice for several days.  Denies urinary symptoms such as frequency, urgency, flank pain, hematuria or n/v, fever, chills. Denies worsening reflux, abd pain, dysphagia, n/v, bowel change or blood.  Pt denies chest pain, increased sob or doe, wheezing, orthopnea, PND, increased LE swelling, palpitations, dizziness or syncope.  Of note is she is s/p partial colon resection, last surveillance CT's done sept 2016 without recurrence.  Due for f/u CT with labs July 2017 and to see Dr Benay Spice, also planned surgical f/u for sept 2017. Denies worsening depressive symptoms, suicidal ideation, or panic; has ongoing anxiety, asks for valium refill. Past Medical History  Diagnosis Date  . Impaired glucose tolerance   . Arthritis   . Allergy   . Allergic rhinitis, cause unspecified 01/28/2012  . Degenerative joint disease 01/28/2012  . Hypertension   . Depression   . Anxiety 01/28/2012  . Anemia   . PONV (postoperative nausea and vomiting)   . Osteoporosis     RT SHOULDER  . History of nonmelanoma skin cancer   . Cecal cancer [facility])    Past Surgical History  Procedure Laterality Date  . Abdominal hysterectomy    . Appendectomy    . Tonsillectomy    . Cholecystectomy    . Back surgery  2010    MICRODCISCECTOMY  . Laparoscopic partial colectomy N/A 06/20/2014    Procedure: LAPAROSCOPIC ASSISTED RIGHT PARTIAL COLECTOMY;  Surgeon: Leighton Ruff, MD;  Location: [facility];  Service: General;  Laterality: N/A;    reports that she has never smoked. She has never used smokeless tobacco. She  reports that she does not drink alcohol or use illicit drugs. family history includes Arthritis in her other; Colon cancer in her father; Heart disease in her other; Stroke in her other. Allergies  Allergen Reactions  . Bactrim [Sulfamethoxazole-Trimethoprim] Itching, Rash and Other (See Comments)    Rash, itching, diaphoretic  . Lovastatin Other (See Comments)    Cognitive slowing and myalgias  . Nitrofurantoin Other (See Comments)    GI upset  . Penicillins Itching  . Pravastatin Other (See Comments)    Myalgias, increased dreams   Current Outpatient Prescriptions on File Prior to Visit  Medication Sig Dispense Refill  . aspirin EC 81 MG tablet Take 81 mg by mouth daily.    . Cholecalciferol (VITAMIN D3) 1000 UNITS CAPS Take 1 capsule by mouth daily.    . Cyanocobalamin (VITAMIN B 12 PO) Take 1 tablet by mouth daily.    . irbesartan (AVAPRO) 300 MG tablet Take 1 tablet (300 mg total) by mouth every morning. 90 tablet 3   No current facility-administered medications on file prior to visit.   Review of Systems  Constitutional: Negative for unusual diaphoresis or night sweats HENT: Negative for ear swelling or discharge Eyes: Negative for worsening visual haziness  Respiratory: Negative for choking and stridor.   Gastrointestinal: Negative for distension or worsening eructation Genitourinary: Negative for retention or change in urine volume.  Musculoskeletal: Negative for other MSK pain or swelling Skin: Negative for  color change and worsening wound Neurological: Negative for tremors and numbness other than noted  Psychiatric/Behavioral: Negative for decreased concentration or agitation other than above       Objective:   Physical Exam BP 138/80 mmHg  Pulse 74  Temp(Src) 98.1 F (36.7 C) (Oral)  Resp 20  Wt 128 lb (58.06 kg)  SpO2 98% VS noted,  Constitutional: Pt appears in no apparent distress HENT: Head: NCAT.  Right Ear: External ear normal.  Left Ear: External ear  normal.  Eyes: . Pupils are equal, round, and reactive to light. Conjunctivae and EOM are normal Neck: Normal range of motion. Neck supple.  Cardiovascular: Normal rate and regular rhythm.   Pulmonary/Chest: Effort normal and breath sounds without rales or wheezing.  Abd:  Soft, NT, ND, + BS but with + hard mid upper mass at least 1 cm- ? Left hepatic related Neurological: Pt is alert. Not confused , motor grossly intact Skin: Skin is warm. No rash, no LE edema Psychiatric: Pt behavior is normal. No agitation. 1-2+ nervous   POCT Urinalysis Dipstick  Status: Finalresult Visible to patient:  Not Released Dx:  Dysuria              Ref Range 8:09 AM  91mo ago  48mo ago  41yr ago     Color, UA  yellow  yellow     Clarity, UA  cloudy  clear     Glucose, UA  negative  neg     Bilirubin, UA  negative  neg     Ketones, UA  negative  neg     Spec Grav, UA  1.025  1.025     Blood, UA  +-  10     pH, UA  5.5  6.0     Protein, UA  negative  neg     Urobilinogen, UA  negative 0.2 0.2 0.2    Nitrite, UA  negative  neg     Leukocytes, UA Negative  moderate (2+) (A) SMALL (A) moderate (2+) (A) MODERATE (A)   Resulting Agency                 Assessment & Plan:

## 2016-05-07 NOTE — Patient Instructions (Signed)
We will hold off on antibiotic for now, but will send the specimen for culture, and you should be notified if the culture indicates the need for antibiotic treatment  Please continue all other medications as before, and refills have been done if requested.  Please have the pharmacy call with any other refills you may need.  Please keep your appointments with your specialists as you may have planned  You will be contacted regarding the referral for: CT scans - to see the [facility] now  Please go to the LAB in the Basement (turn left off the elevator) for the tests to be done today  You will be contacted by phone if any changes need to be made immediately.  Otherwise, you will receive a letter about your results with an explanation, but please check with MyChart first.  Please remember to sign up for MyChart if you have not done so, as this will be important to you in the future with finding out test results, communicating by private email, and scheduling acute appointments online when needed.

## 2016-05-07 NOTE — Assessment & Plan Note (Signed)
For surgical f/u as planned for now,  to f/u any worsening symptoms or concerns

## 2016-05-07 NOTE — Assessment & Plan Note (Addendum)
With abnormal exam and hx of colon ca suspect possible recurrence, will ask the pt to do CT's earlier than expected, with assoc lab including CEA, and if abnormal will need to see Dr Benay Spice earlier  Note:  Total time for pt hx, exam, review of record with pt in the room, determination of diagnoses and plan for further eval and tx is > 40 min, with over 50% spent in coordination and counseling of patient

## 2016-05-07 NOTE — Assessment & Plan Note (Signed)
Pt already with nervous, made worse by sudden request for further evaluation and possible implications, o/w stable overall by history and exam, recent data reviewed with pt, and pt for valium refill,  to f/u any worsening symptoms or concerns, declines any referral such as counseling.

## 2016-05-07 NOTE — Progress Notes (Signed)
Pre visit review using our clinic review tool, if applicable. No additional management support is needed unless otherwise documented below in the visit note. 

## 2016-05-08 ENCOUNTER — Encounter: Payer: Self-pay | Admitting: Internal Medicine

## 2016-05-08 LAB — CEA: CEA: <0.5 ng/mL

## 2016-05-09 LAB — URINE CULTURE: Colony Count: >=100000

## 2016-05-10 ENCOUNTER — Encounter (HOSPITAL_COMMUNITY): Payer: Self-pay

## 2016-05-10 ENCOUNTER — Ambulatory Visit (HOSPITAL_COMMUNITY)
Admission: RE | Admit: 2016-05-10 | Discharge: 2016-05-10 | Disposition: A | Discharge status: Home / Self Care | Payer: Medicare Other | Source: Ambulatory Visit | Attending: Internal Medicine | Admitting: Internal Medicine

## 2016-05-10 ENCOUNTER — Other Ambulatory Visit: Payer: Medicare Other

## 2016-05-10 ENCOUNTER — Encounter: Payer: Self-pay | Admitting: Internal Medicine

## 2016-05-10 DIAGNOSIS — C189 Malignant neoplasm of colon, unspecified: Secondary | ICD-10-CM | POA: Insufficient documentation

## 2016-05-10 DIAGNOSIS — N8110 Cystocele, unspecified: Secondary | ICD-10-CM | POA: Diagnosis not present

## 2016-05-10 DIAGNOSIS — Z9049 Acquired absence of other specified parts of digestive tract: Secondary | ICD-10-CM | POA: Insufficient documentation

## 2016-05-10 DIAGNOSIS — I2510 Atherosclerotic heart disease of native coronary artery without angina pectoris: Secondary | ICD-10-CM | POA: Diagnosis not present

## 2016-05-10 DIAGNOSIS — R1900 Intra-abdominal and pelvic swelling, mass and lump, unspecified site: Secondary | ICD-10-CM | POA: Diagnosis not present

## 2016-05-10 DIAGNOSIS — K838 Other specified diseases of biliary tract: Secondary | ICD-10-CM | POA: Insufficient documentation

## 2016-05-10 DIAGNOSIS — R918 Other nonspecific abnormal finding of lung field: Secondary | ICD-10-CM | POA: Diagnosis not present

## 2016-05-10 DIAGNOSIS — R300 Dysuria: Secondary | ICD-10-CM | POA: Diagnosis not present

## 2016-05-10 DIAGNOSIS — R195 Other fecal abnormalities: Secondary | ICD-10-CM | POA: Insufficient documentation

## 2016-05-10 MED ORDER — IOPAMIDOL (ISOVUE-300) INJECTION 61%
100.0000 mL | Freq: Once | INTRAVENOUS | Status: AC | PRN
  Administered 2016-05-10: 100 mL via INTRAVENOUS

## 2016-06-24 ENCOUNTER — Other Ambulatory Visit (HOSPITAL_BASED_OUTPATIENT_CLINIC_OR_DEPARTMENT_OTHER): Payer: Medicare Other

## 2016-06-24 DIAGNOSIS — C180 Malignant neoplasm of cecum: Secondary | ICD-10-CM | POA: Diagnosis not present

## 2016-06-24 LAB — BASIC METABOLIC PANEL
ANION GAP: 7 meq/L (ref 3–11)
BUN: 17.8 mg/dL (ref 7.0–26.0)
CALCIUM: 9.4 mg/dL (ref 8.4–10.4)
CO2: 27 mEq/L (ref 22–29)
Chloride: 106 mEq/L (ref 98–109)
Creatinine: 0.8 mg/dL (ref 0.6–1.1)
EGFR: 72 mL/min/{1.73_m2} — AB (ref >90)
GLUCOSE: 101 mg/dL (ref 70–140)
Potassium: 4.5 mEq/L (ref 3.5–5.1)
SODIUM: 140 meq/L (ref 136–145)

## 2016-06-25 LAB — CEA (PARALLEL TESTING): CEA: 0.6 ng/mL

## 2016-07-01 ENCOUNTER — Telehealth: Payer: Self-pay | Admitting: Oncology

## 2016-07-01 ENCOUNTER — Ambulatory Visit (HOSPITAL_BASED_OUTPATIENT_CLINIC_OR_DEPARTMENT_OTHER): Payer: Medicare Other | Admitting: Oncology

## 2016-07-01 VITALS — BP 180/71 | HR 72 | Temp 98.2°F | Resp 18 | Ht 64.0 in | Wt 126.8 lb

## 2016-07-01 DIAGNOSIS — C180 Malignant neoplasm of cecum: Secondary | ICD-10-CM | POA: Diagnosis not present

## 2016-07-01 DIAGNOSIS — C182 Malignant neoplasm of ascending colon: Secondary | ICD-10-CM

## 2016-07-01 NOTE — Progress Notes (Signed)
  [place] OFFICE PROGRESS NOTE   Diagnosis: Colon cancer  INTERVAL HISTORY:   Ms. Cino returns as scheduled. She feels well. Good appetite. No difficulty with bowel function. Restaging CTs on 05/10/2016 revealed no evidence of recurrent colon cancer.  Objective:  Vital signs in last 24 hours:  Blood pressure (!) 180/71, pulse 72, temperature 98.2 F (36.8 C), temperature source Oral, resp. rate 18, height '5\' 4"'$  (1.626 m), weight 126 lb 12.8 oz (57.5 kg), SpO2 100 %.    HEENT: Neck without mass Lymphatics: No cervical, supra-clavicular, axillary, or inguinal nodes Resp: Lungs with coarse rhonchi at the left posterior base Cardio: Regular rate and rhythm GI: No hepatomegaly, no mass, nontender Vascular: No leg edema  Lab Results:  Lab Results  Component Value Date   WBC 7.5 05/07/2016   HGB 13.1 05/07/2016   HCT 38.8 05/07/2016   MCV 87.6 05/07/2016   PLT 213.0 05/07/2016   NEUTROABS 4.0 05/07/2016    CEA on 06/24/2016-0.6    Medications: I have reviewed the patient's current medications.  Assessment/Plan: 1. 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   Cycle 2 adjuvant Xeloda 08/08/2014.   Cycle 3 adjuvant Xeloda 08/29/2014.  Cycle 4 adjuvant Xeloda 09/19/2014.  Cycle 5 adjuvant Xeloda 10/10/2014  Cycle 6 adjuvant Xeloda 10/31/2014  Cycle 7 adjuvant Xeloda 11/21/2014  Cycle 8 adjuvant Xeloda 12/13/2014  Surveillance CT scans 05/26/2015 with no evidence of metastatic disease.  Colonoscopy 08/01/2015. Sessile polyp was found in the rectum; polypectomy was performed using snare cautery--tubular adenoma. Examination otherwise normal. Next colonoscopy at a 3  year interval.  Surveillance CT scan 05/10/2016-no evidence of metastatic disease 2. History of iron deficiency anemia 3. Early hand-foot syndrome with mild erythema/tenderness of the palms and soles 10/03/2014    Disposition:  Ms. Fiddler remains in clinical remission from colon cancer. She will return for an office visit and CEA in 6 months.  Betsy Coder, MD  07/01/2016  12:29 PM

## 2016-07-01 NOTE — Telephone Encounter (Signed)
Gave patient avs report and appointments for January 2018.  °

## 2016-09-02 DIAGNOSIS — Z85038 Personal history of other malignant neoplasm of large intestine: Secondary | ICD-10-CM | POA: Diagnosis not present

## 2016-09-17 ENCOUNTER — Ambulatory Visit: Payer: Medicare Other | Admitting: Internal Medicine

## 2016-11-04 ENCOUNTER — Ambulatory Visit (INDEPENDENT_AMBULATORY_CARE_PROVIDER_SITE_OTHER): Payer: Medicare Other | Admitting: Nurse Practitioner

## 2016-11-04 ENCOUNTER — Encounter: Payer: Self-pay | Admitting: Nurse Practitioner

## 2016-11-04 VITALS — BP 158/90 | HR 72 | Temp 97.8°F | Wt 125.0 lb

## 2016-11-04 DIAGNOSIS — S39012A Strain of muscle, fascia and tendon of lower back, initial encounter: Secondary | ICD-10-CM

## 2016-11-04 DIAGNOSIS — Z23 Encounter for immunization: Secondary | ICD-10-CM | POA: Diagnosis not present

## 2016-11-04 MED ORDER — CYCLOBENZAPRINE HCL 5 MG PO TABS: 5.0000 mg | ORAL_TABLET | Freq: Every day | ORAL | 0 refills | Status: DC

## 2016-11-04 MED ORDER — METHYLPREDNISOLONE ACETATE 40 MG/ML IJ SUSP
40.0000 mg | Freq: Once | INTRAMUSCULAR | Status: AC
  Administered 2016-11-04: 40 mg via INTRAMUSCULAR

## 2016-11-04 NOTE — Patient Instructions (Signed)
Lumbosacral Strain Lumbosacral strain is an injury that causes pain in the lower back (lumbosacral spine). This injury usually occurs from overstretching the muscles or ligaments along your spine. A strain can affect one or more muscles or cord-like tissues that connect bones to other bones (ligaments). What are the causes? This condition may be caused by:  A hard, direct hit (blow) to the back.  Excessive stretching of the lower back muscles. This may result from: ? A fall. ? Lifting something heavy. ? Repetitive movements such as bending or crouching.  What increases the risk? The following factors may increase your risk of getting this condition:  Participating in sports or activities that involve: ? A sudden twist of the back. ? Pushing or pulling motions.  Being overweight or obese.  Having poor strength and flexibility, especially tight hamstrings or weak muscles in the back or abdomen.  Having too much of a curve in the lower back.  Having a pelvis that is tilted forward.  What are the signs or symptoms? The main symptom of this condition is pain in the lower back, at the site of the strain. Pain may extend (radiate) down one or both legs. How is this diagnosed? This condition is diagnosed based on:  Your symptoms.  Your medical history.  A physical exam. ? Your health care provider may push on certain areas of your back to determine the source of your pain. ? You may be asked to bend forward, backward, and side to side to assess the severity of your pain and your range of motion.  Imaging tests, such as: ? X-rays. ? MRI.  How is this treated? Treatment for this condition may include:  Putting heat and cold on the affected area.  Medicines to help relieve pain and relax your muscles (muscle relaxants).  NSAIDs to help reduce swelling and discomfort.  When your symptoms improve, it is important to gradually return to your normal routine as soon as possible  to reduce pain, avoid stiffness, and avoid loss of muscle strength. Generally, symptoms should improve within 6 weeks of treatment. However, recovery time varies. Follow these instructions at home: Managing pain, stiffness, and swelling   If directed, put ice on the injured area during the first 24 hours after your strain. ? Put ice in a plastic bag. ? Place a towel between your skin and the bag. ? Leave the ice on for 20 minutes, 2-3 times a day.  If directed, put heat on the affected area as often as told by your health care provider. Use the heat source that your health care provider recommends, such as a moist heat pack or a heating pad. ? Place a towel between your skin and the heat source. ? Leave the heat on for 20-30 minutes. ? Remove the heat if your skin turns bright red. This is especially important if you are unable to feel pain, heat, or cold. You may have a greater risk of getting burned. Activity  Rest and return to your normal activities as told by your health care provider. Ask your health care provider what activities are safe for you.  Avoid activities that take a lot of energy for as long as told by your health care provider. General instructions  Take over-the-counter and prescription medicines only as told by your health care provider.  Donot drive or use heavy machinery while taking prescription pain medicine.  Do not use any products that contain nicotine or tobacco, such as cigarettes and e-cigarettes.   If you need help quitting, ask your health care provider.  Keep all follow-up visits as told by your health care provider. This is important. How is this prevented?  Use correct form when playing sports and lifting heavy objects.  Use good posture when sitting and standing.  Maintain a healthy weight.  Sleep on a mattress with medium firmness to support your back.  Be safe and responsible while being active to avoid falls.  Do at least 150 minutes of  moderate-intensity exercise each week, such as brisk walking or water aerobics. Try a form of exercise that takes stress off your back, such as swimming or stationary cycling.  Maintain physical fitness, including: ? Strength. ? Flexibility. ? Cardiovascular fitness. ? Endurance. Contact a health care provider if:  Your back pain does not improve after 6 weeks of treatment.  Your symptoms get worse. Get help right away if:  Your back pain is severe.  You cannot stand or walk.  You have difficulty controlling when you urinate or when you have a bowel movement.  You feel nauseous or you vomit.  Your feet get very cold.  You have numbness, tingling, weakness, or problems using your arms or legs.  You develop any of the following: ? Shortness of breath. ? Dizziness. ? Pain in your legs. ? Weakness in your buttocks or legs. ? Discoloration of the skin on your toes or legs. This information is not intended to replace advice given to you by your health care provider. Make sure you discuss any questions you have with your health care provider. Document Released: 09/04/2005 Document Revised: 06/14/2016 Document Reviewed: 04/28/2016 Elsevier Interactive Patient Education  2017 [employer]  

## 2016-11-04 NOTE — Progress Notes (Signed)
Subjective:  Patient ID: Carrie Page, female    DOB: 13-Aug-1928  Age: 82 y.o. MRN: [id number]  CC: Back Pain (right hip pain for 2 wks.)   Back Pain  This is a new problem. The current episode started 1 to 4 weeks ago. The problem occurs constantly. The problem is unchanged. The pain is present in the lumbar spine and gluteal (right side). The quality of the pain is described as aching and cramping. The pain radiates to the right thigh. The pain is severe. The symptoms are aggravated by bending, sitting and twisting. Stiffness is present all day. Pertinent negatives include no abdominal pain, bladder incontinence, bowel incontinence, chest pain, dysuria, fever, headaches, numbness, paresis, paresthesias, tingling or weakness. Risk factors include history of cancer, menopause and poor posture. Treatments tried: tylenol. The treatment provided mild relief.    Outpatient Medications Prior to Visit  Medication Sig Dispense Refill  . aspirin EC 81 MG tablet Take 81 mg by mouth daily.    . Cholecalciferol (VITAMIN D3) 1000 UNITS CAPS Take 1 capsule by mouth daily.    . Cyanocobalamin (VITAMIN B 12 PO) Take 1 tablet by mouth daily.    . diazepam (VALIUM) 5 MG tablet Take 1 tablet (5 mg total) by mouth daily as needed for anxiety. 30 tablet 2  . irbesartan (AVAPRO) 300 MG tablet Take 1 tablet (300 mg total) by mouth every morning. 90 tablet 3   No facility-administered medications prior to visit.     ROS See HPI  Objective:  BP (!) 158/90   Pulse 72   Temp 97.8 F (36.6 C) (Oral)   Wt 125 lb (56.7 kg)   SpO2 99%   BMI 21.46 kg/m   BP Readings from Last 3 Encounters:  11/04/16 (!) 158/90  07/01/16 (!) 180/71  05/07/16 138/80    Wt Readings from Last 3 Encounters:  11/04/16 125 lb (56.7 kg)  07/01/16 126 lb 12.8 oz (57.5 kg)  05/07/16 128 lb (58.1 kg)    Physical Exam  Constitutional: She is oriented to person, place, and time.  Musculoskeletal: She exhibits tenderness.  She exhibits no edema.       Right hip: Normal.       Left hip: Normal.       Right knee: Normal.       Left knee: Normal.       Lumbar back: She exhibits decreased range of motion, tenderness, pain and spasm. She exhibits no bony tenderness and no swelling.       Back:  Neurological: She is alert and oriented to person, place, and time.  Skin: Skin is warm and dry. No rash noted. No erythema.    Lab Results  Component Value Date   WBC 7.5 05/07/2016   HGB 13.1 05/07/2016   HCT 38.8 05/07/2016   PLT 213.0 05/07/2016   GLUCOSE 101 06/24/2016   CHOL 218 (H) 03/19/2016   TRIG 152.0 (H) 03/19/2016   HDL 71.40 03/19/2016   LDLDIRECT 124.0 03/04/2013   LDLCALC 116 (H) 03/19/2016   ALT 13 05/07/2016   AST 19 05/07/2016   NA 140 06/24/2016   K 4.5 06/24/2016   CL 103 05/07/2016   CREATININE 0.8 06/24/2016   BUN 17.8 06/24/2016   CO2 27 06/24/2016   TSH 1.80 03/19/2016   HGBA1C 6.2 03/19/2016    Ct Chest W Contrast  Result Date: 05/10/2016 CLINICAL DATA:  History of prior colon carcinoma, currently presenting with palpable fullness upper abdominal region  EXAM: CT CHEST, ABDOMEN, AND PELVIS WITH CONTRAST TECHNIQUE: Multidetector CT imaging of the chest, abdomen and pelvis was performed following the standard protocol during bolus administration of intravenous contrast. Oral contrast was also administered. CONTRAST:  175mL ISOVUE-300 IOPAMIDOL (ISOVUE-300) INJECTION 61% COMPARISON:  CT chest, abdomen, and pelvis May 26, 2015 FINDINGS: CT CHEST FINDINGS Mediastinum/Lymph Nodes: Thyroid appears normal. There is no appreciable thoracic adenopathy. There is no appreciable thoracic aortic aneurysm or dissection. The visualized great vessels appear unremarkable. There are foci of coronary artery calcification, primarily in the left anterior descending coronary artery region. The pericardium is not appreciably thickened. No major vessel pulmonary embolus is evident. Lungs/Pleura: There is slight  scarring in the apices bilaterally. There is also stable mild scarring in the posterior segment of the right upper lobe peripherally. There is no edema or consolidation. On axial slice 58 series 5, there is a 2 mm nodular opacity in the posterior segment right upper lobe, stable but slightly better seen currently. On axial slice 99991111 series 5, there is a stable 4 mm nodular opacity in the posterior segment of the right lower lobe. No appreciable pleural effusion. Musculoskeletal: There is extensive degenerative change in the sternoclavicular joint regions bilaterally, more severe on the left than on the right. No blastic or lytic bone lesions are evident. There is degenerative change in the thoracic spine. CT ABDOMEN PELVIS FINDINGS Hepatobiliary: No focal liver lesions are identified. Gallbladder is absent. There is stable intrahepatic and extrahepatic biliary duct dilatation without mass or calculus appreciable in the biliary ductal system. The degree of biliary duct dilatation is stable compared to prior study. Pancreas: There is no demonstrable pancreatic mass or inflammatory focus. Spleen: No splenic lesions are evident. A calcified splenic artery aneurysm measuring 9 x 8 mm is stable. Adrenals/Urinary Tract: Adrenals appear unremarkable bilaterally. Kidneys bilaterally show no evident mass or hydronephrosis on either side. There is an extrarenal pelvis on each side, an anatomic variant. There is no renal or ureteral calculus on either side. The urinary bladder is midline with wall thickness within normal limits. There is a cystocele inferiorly with prolapse of a portion of the inferior urinary bladder, stable. There is diffuse stool throughout the colon. There is dilatation of the: At the level of the hepatic flexure and proximal transverse colon due to diffuse inspissated stool. Patient has had a previous partial right colectomy with anastomosis patent. There is no bowel obstruction. No free air or portal  venous air. There is no bowel wall or mesenteric thickening evident. Vascular/Lymphatic: There is atherosclerotic calcification in both renal arteries. Aorta is tortuous without aneurysmal dilatation. No major vessel obstruction seen. There is no appreciable adenopathy in the abdomen or pelvis. Reproductive: Uterus is absent. There is no pelvic mass or pelvic fluid collection. Other: Appendix is absent. There is no ascites or abscess in the abdomen or pelvis. There is no mass arising from the mesentery or omentum in the abdomen or pelvis. Musculoskeletal: There is lumbar levoscoliosis. There is extensive degenerative change throughout the lumbar spine. There are no blastic or lytic bone lesions. There is no intramuscular or abdominal wall lesion evident. IMPRESSION: CT chest: Stable small nodular opacities. No edema or consolidation. No new parenchymal lung lesion. No adenopathy. Extensive arthropathy in the sternoclavicular joint regions, more severe on the left than on the right. No neoplastic focus evident in the chest. There are foci of coronary artery calcification. CT abdomen and pelvis: Extensive stool throughout the colon. There is inspissated stool in the  hepatic flexure and proximal transverse colon region with mild dilatation of the colon in these areas. To a lesser degree, there is inspissated stool on the left, with slightly less colonic dilatation. No associated mass is evident by CT. No mass is seen arising from the omentum or mesentery Status post cholecystectomy with stable biliary duct dilatation. No biliary duct mass or calculus evident. Cystocele with a degree of urinary bladder prolapse, stable. No bowel obstruction.  No abscess. No renal or ureteral calculus.  No hydronephrosis. Electronically Signed   By: Lowella Grip III M.D.   On: 05/10/2016 14:38   Ct Abdomen Pelvis W Contrast  Result Date: 05/10/2016 CLINICAL DATA:  History of prior colon carcinoma, currently presenting with  palpable fullness upper abdominal region EXAM: CT CHEST, ABDOMEN, AND PELVIS WITH CONTRAST TECHNIQUE: Multidetector CT imaging of the chest, abdomen and pelvis was performed following the standard protocol during bolus administration of intravenous contrast. Oral contrast was also administered. CONTRAST:  151mL ISOVUE-300 IOPAMIDOL (ISOVUE-300) INJECTION 61% COMPARISON:  CT chest, abdomen, and pelvis May 26, 2015 FINDINGS: CT CHEST FINDINGS Mediastinum/Lymph Nodes: Thyroid appears normal. There is no appreciable thoracic adenopathy. There is no appreciable thoracic aortic aneurysm or dissection. The visualized great vessels appear unremarkable. There are foci of coronary artery calcification, primarily in the left anterior descending coronary artery region. The pericardium is not appreciably thickened. No major vessel pulmonary embolus is evident. Lungs/Pleura: There is slight scarring in the apices bilaterally. There is also stable mild scarring in the posterior segment of the right upper lobe peripherally. There is no edema or consolidation. On axial slice 58 series 5, there is a 2 mm nodular opacity in the posterior segment right upper lobe, stable but slightly better seen currently. On axial slice 99991111 series 5, there is a stable 4 mm nodular opacity in the posterior segment of the right lower lobe. No appreciable pleural effusion. Musculoskeletal: There is extensive degenerative change in the sternoclavicular joint regions bilaterally, more severe on the left than on the right. No blastic or lytic bone lesions are evident. There is degenerative change in the thoracic spine. CT ABDOMEN PELVIS FINDINGS Hepatobiliary: No focal liver lesions are identified. Gallbladder is absent. There is stable intrahepatic and extrahepatic biliary duct dilatation without mass or calculus appreciable in the biliary ductal system. The degree of biliary duct dilatation is stable compared to prior study. Pancreas: There is no  demonstrable pancreatic mass or inflammatory focus. Spleen: No splenic lesions are evident. A calcified splenic artery aneurysm measuring 9 x 8 mm is stable. Adrenals/Urinary Tract: Adrenals appear unremarkable bilaterally. Kidneys bilaterally show no evident mass or hydronephrosis on either side. There is an extrarenal pelvis on each side, an anatomic variant. There is no renal or ureteral calculus on either side. The urinary bladder is midline with wall thickness within normal limits. There is a cystocele inferiorly with prolapse of a portion of the inferior urinary bladder, stable. There is diffuse stool throughout the colon. There is dilatation of the: At the level of the hepatic flexure and proximal transverse colon due to diffuse inspissated stool. Patient has had a previous partial right colectomy with anastomosis patent. There is no bowel obstruction. No free air or portal venous air. There is no bowel wall or mesenteric thickening evident. Vascular/Lymphatic: There is atherosclerotic calcification in both renal arteries. Aorta is tortuous without aneurysmal dilatation. No major vessel obstruction seen. There is no appreciable adenopathy in the abdomen or pelvis. Reproductive: Uterus is absent. There is no pelvic  mass or pelvic fluid collection. Other: Appendix is absent. There is no ascites or abscess in the abdomen or pelvis. There is no mass arising from the mesentery or omentum in the abdomen or pelvis. Musculoskeletal: There is lumbar levoscoliosis. There is extensive degenerative change throughout the lumbar spine. There are no blastic or lytic bone lesions. There is no intramuscular or abdominal wall lesion evident. IMPRESSION: CT chest: Stable small nodular opacities. No edema or consolidation. No new parenchymal lung lesion. No adenopathy. Extensive arthropathy in the sternoclavicular joint regions, more severe on the left than on the right. No neoplastic focus evident in the chest. There are foci  of coronary artery calcification. CT abdomen and pelvis: Extensive stool throughout the colon. There is inspissated stool in the hepatic flexure and proximal transverse colon region with mild dilatation of the colon in these areas. To a lesser degree, there is inspissated stool on the left, with slightly less colonic dilatation. No associated mass is evident by CT. No mass is seen arising from the omentum or mesentery Status post cholecystectomy with stable biliary duct dilatation. No biliary duct mass or calculus evident. Cystocele with a degree of urinary bladder prolapse, stable. No bowel obstruction.  No abscess. No renal or ureteral calculus.  No hydronephrosis. Electronically Signed   By: Lowella Grip III M.D.   On: 05/10/2016 14:38    Assessment & Plan:   [place] was seen today for back pain.  Diagnoses and all orders for this visit:  Lumbar strain, initial encounter -     methylPREDNISolone acetate (DEPO-MEDROL) injection 40 mg; Inject 1 mL (40 mg total) into the muscle once. -     cyclobenzaprine (FLEXERIL) 5 MG tablet; Take 1 tablet (5 mg total) by mouth at bedtime.  Encounter for immunization -     Flu vaccine HIGH DOSE PF   I am having Ms. Lokken start on cyclobenzaprine. I am also having her maintain her Vitamin D3, aspirin EC, Cyanocobalamin (VITAMIN B 12 PO), irbesartan, and diazepam. We administered methylPREDNISolone acetate.  Meds ordered this encounter  Medications  . methylPREDNISolone acetate (DEPO-MEDROL) injection 40 mg  . cyclobenzaprine (FLEXERIL) 5 MG tablet    Sig: Take 1 tablet (5 mg total) by mouth at bedtime.    Dispense:  7 tablet    Refill:  0    Order Specific Question:   Supervising Provider    Answer:   Cassandria Anger [[id number]]    Follow-up: No Follow-up on file.  Wilfred Lacy, NP

## 2016-11-04 NOTE — Progress Notes (Signed)
Pre visit review using our clinic review tool, if applicable. No additional management support is needed unless otherwise documented below in the visit note. 

## 2016-11-19 ENCOUNTER — Other Ambulatory Visit: Payer: Self-pay | Admitting: Internal Medicine

## 2016-12-30 ENCOUNTER — Telehealth: Payer: Self-pay | Admitting: Oncology

## 2016-12-30 ENCOUNTER — Ambulatory Visit (HOSPITAL_BASED_OUTPATIENT_CLINIC_OR_DEPARTMENT_OTHER): Payer: Medicare Other | Admitting: Oncology

## 2016-12-30 ENCOUNTER — Other Ambulatory Visit (HOSPITAL_BASED_OUTPATIENT_CLINIC_OR_DEPARTMENT_OTHER): Payer: Medicare Other

## 2016-12-30 VITALS — BP 171/66 | HR 78 | Temp 97.8°F | Resp 17 | Ht 64.0 in | Wt 125.8 lb

## 2016-12-30 DIAGNOSIS — D509 Iron deficiency anemia, unspecified: Secondary | ICD-10-CM | POA: Diagnosis not present

## 2016-12-30 DIAGNOSIS — C182 Malignant neoplasm of ascending colon: Secondary | ICD-10-CM

## 2016-12-30 LAB — CEA (IN HOUSE-CHCC): CEA (CHCC-IN HOUSE): 1.18 ng/mL (ref 0.00–5.00)

## 2016-12-30 NOTE — Telephone Encounter (Signed)
2 bottles of contrast was given to patient, along with a copy of the instructions, per CT ordered. Appointments scheduled per 12/30/16 los. Patient was given a copy of the appointment schedule and AVS report, per 12/30/16 los.

## 2016-12-30 NOTE — Progress Notes (Signed)
  [facility] OFFICE PROGRESS NOTE   Diagnosis: Colon cancer  INTERVAL HISTORY:   Ms. Siers returns as scheduled. She feels well. Good appetite. No complaint.  Objective:  Vital signs in last 24 hours:  Blood pressure (!) 171/66, pulse 78, temperature 97.8 F (36.6 C), temperature source Oral, resp. rate 17, height 5' 4" (1.626 m), weight 125 lb 12.8 oz (57.1 kg), SpO2 100 %.    HEENT: Neck without mass Lymphatics: No cervical, supraclavicular, axillary, or inguinal nodes Resp: Lungs clear bilaterally Cardio: Regular rate and rhythm GI: No hepatosplenomegaly, no mass, nontender Vascular: No leg edema   Lab Results: CEA-pending   Imaging:  No results found.  Medications: I have reviewed the patient's current medications.  Assessment/Plan: 1. 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   Cycle 2 adjuvant Xeloda 08/08/2014.   Cycle 3 adjuvant Xeloda 08/29/2014.  Cycle 4 adjuvant Xeloda 09/19/2014.  Cycle 5 adjuvant Xeloda 10/10/2014  Cycle 6 adjuvant Xeloda 10/31/2014  Cycle 7 adjuvant Xeloda 11/21/2014  Cycle 8 adjuvant Xeloda 12/13/2014  Surveillance CT scans 05/26/2015 with no evidence of metastatic disease.  Colonoscopy 08/01/2015. Sessile polyp was found in the rectum; polypectomy was performed using snare cautery--tubular adenoma. Examination otherwise normal. Next colonoscopy at a 3 year interval.  Surveillance CT scan 05/10/2016-no evidence of metastatic disease 2. History of iron deficiency anemia 3. Early hand-foot syndrome with mild erythema/tenderness of the palms and soles 10/03/2014    Disposition:  Ms. Ivie remains in clinical remission  from colon cancer. We will follow-up on the CEA from today. She will return for an office visit and restaging CT scans in 6 months.  15 minutes were spent with the patient today. The majority of the time was used for counseling and coordination of care. Betsy Coder, MD  12/30/2016  12:45 PM

## 2016-12-31 ENCOUNTER — Telehealth: Payer: Self-pay | Admitting: *Deleted

## 2016-12-31 LAB — CEA (PARALLEL TESTING): CEA: <0.5 ng/mL

## 2016-12-31 NOTE — Telephone Encounter (Signed)
Left message on voicemail for pt to call office.  

## 2016-12-31 NOTE — Telephone Encounter (Signed)
-----   Message from Ladell Pier, MD sent at 12/31/2016  1:41 PM EST ----- Please call patient, cea is normal

## 2017-03-20 ENCOUNTER — Ambulatory Visit (INDEPENDENT_AMBULATORY_CARE_PROVIDER_SITE_OTHER): Payer: Medicare Other | Admitting: Internal Medicine

## 2017-03-20 ENCOUNTER — Encounter: Payer: Self-pay | Admitting: Internal Medicine

## 2017-03-20 VITALS — BP 144/68 | HR 68 | Temp 98.0°F | Ht 60.0 in | Wt 128.0 lb

## 2017-03-20 DIAGNOSIS — Z0000 Encounter for general adult medical examination without abnormal findings: Secondary | ICD-10-CM | POA: Diagnosis not present

## 2017-03-20 DIAGNOSIS — E2839 Other primary ovarian failure: Secondary | ICD-10-CM | POA: Diagnosis not present

## 2017-03-20 MED ORDER — IRBESARTAN 300 MG PO TABS: 300.0000 mg | ORAL_TABLET | Freq: Every morning | ORAL | 3 refills | Status: DC

## 2017-03-20 NOTE — Progress Notes (Signed)
Pre visit review using our clinic review tool, if applicable. No additional management support is needed unless otherwise documented below in the visit note. 

## 2017-03-20 NOTE — Assessment & Plan Note (Signed)

## 2017-03-20 NOTE — Progress Notes (Signed)
Subjective:    Patient ID: Carrie Page, female    DOB: Apr 27, 1928, 83 y.o.   MRN: [id number]  HPI  Here for wellness and f/u;  Overall doing ok;  Pt denies Chest pain, worsening SOB, DOE, wheezing, orthopnea, PND, worsening LE edema, palpitations, dizziness or syncope.  Pt denies neurological change such as new headache, facial or extremity weakness.  Pt denies polydipsia, polyuria, or low sugar symptoms. Pt states overall good compliance with treatment and medications, good tolerability, and has been trying to follow appropriate diet.  Pt denies worsening depressive symptoms, suicidal ideation or panic. No fever, night sweats, wt loss, loss of appetite, or other constitutional symptoms.  Pt states good ability with ADL's, has low fall risk, home safety reviewed and adequate, no other significant changes in hearing or vision, and only occasionally active with exercise. Miralax has worked for constipation after last visit June 207.  Wt Readings from Last 3 Encounters:  03/20/17 128 lb (58.1 kg)  12/30/16 125 lb 12.8 oz (57.1 kg)  11/04/16 125 lb (56.7 kg)   BP Readings from Last 3 Encounters:  03/20/17 (!) 144/68  12/30/16 (!) 171/66  11/04/16 (!) 158/90  Has not seen optho recently in the past yr, but will make appt.  She states BP at home < 140/90 usually.  Her only c/o is of financial pressures, but getting by. Able to take all meds. No other new history Past Medical History:  Diagnosis Date  . Allergic rhinitis, cause unspecified 01/28/2012  . Allergy   . Anemia   . Anxiety 01/28/2012  . Arthritis   . Cecal cancer ([place])   . Degenerative joint disease 01/28/2012  . Depression   . History of nonmelanoma skin cancer   . Hypertension   . Impaired glucose tolerance   . Osteoporosis    RT SHOULDER  . PONV (postoperative nausea and vomiting)    Past Surgical History:  Procedure Laterality Date  . ABDOMINAL HYSTERECTOMY    . APPENDECTOMY    . BACK SURGERY  2010   MICRODCISCECTOMY    . CHOLECYSTECTOMY    . LAPAROSCOPIC PARTIAL COLECTOMY N/A 06/20/2014   Procedure: LAPAROSCOPIC ASSISTED RIGHT PARTIAL COLECTOMY;  Surgeon: Leighton Ruff, MD;  Location: [facility];  Service: General;  Laterality: N/A;  . TONSILLECTOMY      reports that she has never smoked. She has never used smokeless tobacco. She reports that she does not drink alcohol or use drugs. family history includes Arthritis in her other; Colon cancer in her father; Heart disease in her other; Stroke in her other. Allergies  Allergen Reactions  . Bactrim [Sulfamethoxazole-Trimethoprim] Itching, Rash and Other (See Comments)    Rash, itching, diaphoretic  . Lovastatin Other (See Comments)    Cognitive slowing and myalgias  . Nitrofurantoin Other (See Comments)    GI upset  . Penicillins Itching  . Pravastatin Other (See Comments)    Myalgias, increased dreams  . Naproxen Rash   Current Outpatient Prescriptions on File Prior to Visit  Medication Sig Dispense Refill  . aspirin EC 81 MG tablet Take 81 mg by mouth daily.    . Cholecalciferol (VITAMIN D3) 1000 UNITS CAPS Take 1 capsule by mouth daily.    . Cyanocobalamin (VITAMIN B 12 PO) Take 1 tablet by mouth daily.    . diazepam (VALIUM) 5 MG tablet Take 1 tablet (5 mg total) by mouth daily as needed for anxiety. 30 tablet 2   No current facility-administered medications on file prior  to visit.    Review of Systems Constitutional: Negative for other unusual diaphoresis, sweats, appetite or weight changes HENT: Negative for other worsening hearing loss, ear pain, facial swelling, mouth sores or neck stiffness.   Eyes: Negative for other worsening pain, redness or other visual disturbance.  Respiratory: Negative for other stridor or swelling Cardiovascular: Negative for other palpitations or other chest pain  Gastrointestinal: Negative for worsening diarrhea or loose stools, blood in stool, distention or other pain Genitourinary: Negative for hematuria, flank  pain or other change in urine volume.  Musculoskeletal: Negative for myalgias or other joint swelling.  Skin: Negative for other color change, or other wound or worsening drainage.  Neurological: Negative for other syncope or numbness. Hematological: Negative for other adenopathy or swelling Psychiatric/Behavioral: Negative for hallucinations, other worsening agitation, SI, self-injury, or new decreased concentration All other system neg per pt    Objective:   Physical Exam BP (!) 144/68   Pulse 68   Temp 98 F (36.7 C) (Oral)   Ht 5' (1.524 m)   Wt 128 lb (58.1 kg)   SpO2 99%   BMI 25.00 kg/m  VS noted, not ill appearing Constitutional: Pt is oriented to person, place, and time. Appears well-developed and well-nourished, in no significant distress and comfortable Head: Normocephalic and atraumatic  Eyes: Conjunctivae and EOM are normal. Pupils are equal, round, and reactive to light Right Ear: External ear normal without discharge Left Ear: External ear normal without discharge Nose: Nose without discharge or deformity Mouth/Throat: Oropharynx is without other ulcerations and moist  Neck: Normal range of motion. Neck supple. No JVD present. No tracheal deviation present or significant neck LA or mass Cardiovascular: Normal rate, regular rhythm, normal heart sounds and intact distal pulses.   Pulmonary/Chest: WOB normal and breath sounds without rales or wheezing  Abdominal: Soft. Bowel sounds are normal. NT. No HSM  Musculoskeletal: Normal range of motion. Exhibits no edema Lymphadenopathy: Has no other cervical adenopathy.  Neurological: Pt is alert and oriented to person, place, and time. Pt has normal reflexes. No cranial nerve deficit. Motor grossly intact, Gait intact Skin: Skin is warm and dry. No rash noted or new ulcerations Psychiatric:  Has normal mood and affect. Behavior is normal without agitation No other exam findings  ECG today I have personally  intepreted Sinus  Rhythm - WNL  Lab Results  Component Value Date   WBC 7.5 05/07/2016   HGB 13.1 05/07/2016   HCT 38.8 05/07/2016   PLT 213.0 05/07/2016   GLUCOSE 101 06/24/2016   CHOL 218 (H) 03/19/2016   TRIG 152.0 (H) 03/19/2016   HDL 71.40 03/19/2016   LDLDIRECT 124.0 03/04/2013   LDLCALC 116 (H) 03/19/2016   ALT 13 05/07/2016   AST 19 05/07/2016   NA 140 06/24/2016   K 4.5 06/24/2016   CL 103 05/07/2016   CREATININE 0.8 06/24/2016   BUN 17.8 06/24/2016   CO2 27 06/24/2016   TSH 1.80 03/19/2016   HGBA1C 6.2 03/19/2016       Assessment & Plan:

## 2017-03-20 NOTE — Patient Instructions (Addendum)
Please schedule the bone density test before leaving today at the scheduling desk (where you check out)  Please continue all other medications as before, and refills have been done if requested.  Please have the pharmacy call with any other refills you may need.  Please continue your efforts at being more active, low cholesterol diet, and weight control.  You are otherwise up to date with prevention measures today.  Please keep your appointments with your specialists as you may have planned  Please return in 1 year for your yearly visit, or sooner if needed, with Lab testing done 3-5 days before

## 2017-06-19 ENCOUNTER — Ambulatory Visit (INDEPENDENT_AMBULATORY_CARE_PROVIDER_SITE_OTHER): Payer: Medicare Other | Admitting: *Deleted

## 2017-06-19 VITALS — BP 134/62 | HR 69 | Resp 20 | Ht 60.0 in | Wt 126.0 lb

## 2017-06-19 DIAGNOSIS — Z0000 Encounter for general adult medical examination without abnormal findings: Secondary | ICD-10-CM

## 2017-06-19 NOTE — Progress Notes (Addendum)
Subjective:   Carrie Page is a 83 y.o. female who presents for an Initial Medicare Annual Wellness Visit.  Review of Systems    No ROS.  Medicare Wellness Visit. Additional risk factors are reflected in the social history.   Cardiac Risk Factors include: advanced age (>72men, >23 women);dyslipidemia;hypertension Sleep patterns: feels rested on waking, gets up 1 times nightly to void and sleeps 6-7 hours nightly.    Home Safety/Smoke Alarms: Feels safe in home. Smoke alarms in place.  Living environment; residence and Firearm Safety: 1-story house/ trailer, no firearms. Lives alone, no needs for DME, good support system  Seat Belt Safety/Bike Helmet: Wears seat belt.   Counseling:   Eye Exam- Last over 2 years ago, patient states she will call her eye doctor to make an appointment Dental- dentures  Female:   Pap- N/A       Mammo- N/A      Dexa scan- appointment scheduled for 07/21/17     CCS- Last 08/01/15, Polyp, no recall due to age      Objective:    Today's Vitals   06/19/17 1419  BP: 134/62  Pulse: 69  Resp: 20  SpO2: 99%  Weight: 126 lb (57.2 kg)  Height: 5' (1.524 m)   Body mass index is 24.61 kg/m.   Current Medications (verified) Outpatient Encounter Prescriptions as of 06/19/2017  Medication Sig  . aspirin EC 81 MG tablet Take 81 mg by mouth daily.  . Cholecalciferol (VITAMIN D3) 1000 UNITS CAPS Take 1 capsule by mouth daily.  . Cyanocobalamin (VITAMIN B 12 PO) Take 1 tablet by mouth daily.  . diazepam (VALIUM) 5 MG tablet Take 1 tablet (5 mg total) by mouth daily as needed for anxiety.  . irbesartan (AVAPRO) 300 MG tablet Take 1 tablet (300 mg total) by mouth every morning.   No facility-administered encounter medications on file as of 06/19/2017.     Allergies (verified) Bactrim [sulfamethoxazole-trimethoprim]; Lovastatin; Nitrofurantoin; Penicillins; Pravastatin; and Naproxen   History: Past Medical History:  Diagnosis Date  . Allergic  rhinitis, cause unspecified 01/28/2012  . Allergy   . Anemia   . Anxiety 01/28/2012  . Arthritis   . Cecal cancer ([place])   . Degenerative joint disease 01/28/2012  . Depression   . History of nonmelanoma skin cancer   . Hypertension   . Impaired glucose tolerance   . Osteoporosis    RT SHOULDER  . PONV (postoperative nausea and vomiting)    Past Surgical History:  Procedure Laterality Date  . ABDOMINAL HYSTERECTOMY    . APPENDECTOMY    . BACK SURGERY  2010   MICRODCISCECTOMY  . CHOLECYSTECTOMY    . LAPAROSCOPIC PARTIAL COLECTOMY N/A 06/20/2014   Procedure: LAPAROSCOPIC ASSISTED RIGHT PARTIAL COLECTOMY;  Surgeon: Leighton Ruff, MD;  Location: [facility];  Service: General;  Laterality: N/A;  . TONSILLECTOMY     Family History  Problem Relation Age of Onset  . Arthritis Other   . Heart disease Other   . Stroke Other   . Colon cancer Father        unsure age   Social History   Occupational History  . retired    Social History Main Topics  . Smoking status: Never Smoker  . Smokeless tobacco: Never Used  . Alcohol use No  . Drug use: No  . Sexual activity: Not Currently    Tobacco Counseling Counseling given: Not Answered   Activities of Daily Living In your present state of health,  do you have any difficulty performing the following activities: 06/19/2017  Hearing? N  Vision? N  Difficulty concentrating or making decisions? N  Walking or climbing stairs? N  Dressing or bathing? N  Doing errands, shopping? N  Preparing Food and eating ? N  Using the Toilet? N  In the past six months, have you accidently leaked urine? N  Do you have problems with loss of bowel control? N  Managing your Medications? N  Managing your Finances? N  Housekeeping or managing your Housekeeping? N  Some recent data might be hidden    Immunizations and Health Maintenance Immunization History  Administered Date(s) Administered  . Influenza Split 09/03/2012  . Influenza, High Dose  Seasonal PF 11/04/2016  . Influenza,inj,Quad PF,36+ Mos 09/09/2013, 09/15/2014, 09/07/2015  . Pneumococcal Conjugate-13 09/09/2013  . Pneumococcal Polysaccharide-23 01/28/2012  . Tdap 03/19/2016   Health Maintenance Due  Topic Date Due  . FOOT EXAM  10[birth year]12/1940  . OPHTHALMOLOGY EXAM  10/12/1940  . DEXA SCAN  10/13/1995    Patient Care Team: Biagio Borg, MD as PCP - General (Internal Medicine)  Indicate any recent Medical Services you may have received from other than [facility] providers in the past year (date may be approximate).     Assessment:   This is a routine wellness examination for Carrie Page.Physical assessment deferred to PCP.   Hearing/Vision screen Hearing Screening Comments: Able to hear conversational tones w/o difficulty. No issues reported.  Passed whisper test Vision Screening Comments: Wears glasses  Dietary issues and exercise activities discussed: Current Exercise Habits: Home exercise routine, Type of exercise: stretching;calisthenics, Time (Minutes): 30, Frequency (Times/Week): 5, Weekly Exercise (Minutes/Week): 150, Intensity: Mild, Exercise limited by: None identified  Diet (meal preparation, eat out, water intake, caffeinated beverages, dairy products, fruits and vegetables): in general, a "healthy" diet  , well balanced, low fat/ cholesterol, low salt eats a variety of fruits and vegetables daily, limits salt, fat/cholesterol, sugar, caffeine, drinks 2-3 glasses of water daily.   encouraged patient to increase daily water intake.   Goals    . I would like to decrease my stress level          Continue to do deep breathing an review the stress reduction information that I received today.       Depression Screen PHQ 2/9 Scores 06/19/2017 03/20/2017 03/19/2016  PHQ - 2 Score 2 0 0  PHQ- 9 Score 4 0 -    Fall Risk Fall Risk  06/19/2017 03/20/2017 03/19/2016 03/19/2016 11/14/2014  Falls in the past year? Yes No No No Yes  Number falls in past yr: 1 - - - 1    Injury with Fall? No - - - Yes  Follow up Falls prevention discussed - - - -    Cognitive Function: MMSE - Mini Mental State Exam 06/19/2017  Orientation to time 5  Orientation to Place 5  Registration 3  Attention/ Calculation 4  Recall 2  Language- name 2 objects 2  Language- repeat 1  Language- follow 3 step command 3  Language- read & follow direction 1  Write a sentence 1  Copy design 1  Total score 28        Screening Tests Health Maintenance  Topic Date Due  . FOOT EXAM  10[birth year]12/1940  . OPHTHALMOLOGY EXAM  10/12/1940  . DEXA SCAN  10/13/1995  . INFLUENZA VACCINE  07/09/2017  . TETANUS/TDAP  03/19/2026  . PNA vac Low Risk Adult  Completed  Plan:    Continue doing brain stimulating activities (puzzles, reading, adult coloring books, staying active) to keep memory sharp.   Continue to eat heart healthy diet (full of fruits, vegetables, whole grains, lean protein, water--limit salt, fat, and sugar intake) and increase physical activity as tolerated.  I have personally reviewed and noted the following in the patient's chart:   . Medical and social history . Use of alcohol, tobacco or illicit drugs  . Current medications and supplements . Functional ability and status . Nutritional status . Physical activity . Advanced directives . List of other physicians . Vitals . Screenings to include cognitive, depression, and falls . Referrals and appointments  In addition, I have reviewed and discussed with patient certain preventive protocols, quality metrics, and best practice recommendations. A written personalized care plan for preventive services as well as general preventive health recommendations were provided to patient.     Michiel Cowboy, RN   06/19/2017   Medical screening examination/treatment/procedure(s) were performed by non-physician practitioner and as supervising physician I was immediately available for consultation/collaboration. I agree with  above. Cathlean Cower, MD

## 2017-06-19 NOTE — Patient Instructions (Addendum)
Continue doing brain stimulating activities (puzzles, reading, adult coloring books, staying active) to keep memory sharp.   Continue to eat heart healthy diet (full of fruits, vegetables, whole grains, lean protein, water--limit salt, fat, and sugar intake) and increase physical activity as tolerated.    Carrie Page , Thank you for taking time to come for your Medicare Wellness Visit. I appreciate your ongoing commitment to your health goals. Please review the following plan we discussed and let me know if I can assist you in the future.   These are the goals we discussed: Goals    . I would like to decrease my stress level          Continue to do deep breathing an review the stress reduction information that I received today.        This is a list of the screening recommended for you and due dates:  Health Maintenance  Topic Date Due  . Complete foot exam   10[birth year]12/1940  . Eye exam for diabetics  10/12/1940  . DEXA scan (bone density measurement)  10/13/1995  . Flu Shot  07/09/2017  . Tetanus Vaccine  03/19/2026  . Pneumonia vaccines  Completed    Stress and Stress Management Stress is a normal reaction to life events. It is what you feel when life demands more than you are used to or more than you can handle. Some stress can be useful. For example, the stress reaction can help you catch the last bus of the day, study for a test, or meet a deadline at work. But stress that occurs too often or for too long can cause problems. It can affect your emotional health and interfere with relationships and normal daily activities. Too much stress can weaken your immune system and increase your risk for physical illness. If you already have a medical problem, stress can make it worse. What are the causes? All sorts of life events may cause stress. An event that causes stress for one person may not be stressful for another person. Major life events commonly cause stress. These may be positive or  negative. Examples include losing your job, moving into a new home, getting married, having a baby, or losing a loved one. Less obvious life events may also cause stress, especially if they occur day after day or in combination. Examples include working long hours, driving in traffic, caring for children, being in debt, or being in a difficult relationship. What are the signs or symptoms? Stress may cause emotional symptoms including, the following:  Anxiety. This is feeling worried, afraid, on edge, overwhelmed, or out of control.  Anger. This is feeling irritated or impatient.  Depression. This is feeling sad, down, helpless, or guilty.  Difficulty focusing, remembering, or making decisions.  Stress may cause physical symptoms, including the following:  Aches and pains. These may affect your head, neck, back, stomach, or other areas of your body.  Tight muscles or clenched jaw.  Low energy or trouble sleeping.  Stress may cause unhealthy behaviors, including the following:  Eating to feel better (overeating) or skipping meals.  Sleeping too little, too much, or both.  Working too much or putting off tasks (procrastination).  Smoking, drinking alcohol, or using drugs to feel better.  How is this diagnosed? Stress is diagnosed through an assessment by your health care provider. Your health care provider will ask questions about your symptoms and any stressful life events.Your health care provider will also ask about your medical history  and may order blood tests or other tests. Certain medical conditions and medicine can cause physical symptoms similar to stress. Mental illness can cause emotional symptoms and unhealthy behaviors similar to stress. Your health care provider may refer you to a mental health professional for further evaluation. How is this treated? Stress management is the recommended treatment for stress.The goals of stress management are reducing stressful life  events and coping with stress in healthy ways. Techniques for reducing stressful life events include the following:  Stress identification. Self-monitor for stress and identify what causes stress for you. These skills may help you to avoid some stressful events.  Time management. Set your priorities, keep a calendar of events, and learn to say "no." These tools can help you avoid making too many commitments.  Techniques for coping with stress include the following:  Rethinking the problem. Try to think realistically about stressful events rather than ignoring them or overreacting. Try to find the positives in a stressful situation rather than focusing on the negatives.  Exercise. Physical exercise can release both physical and emotional tension. The key is to find a form of exercise you enjoy and do it regularly.  Relaxation techniques. These relax the body and mind. Examples include yoga, meditation, tai chi, biofeedback, deep breathing, progressive muscle relaxation, listening to music, being out in nature, journaling, and other hobbies. Again, the key is to find one or more that you enjoy and can do regularly.  Healthy lifestyle. Eat a balanced diet, get plenty of sleep, and do not smoke. Avoid using alcohol or drugs to relax.  Strong support network. Spend time with family, friends, or other people you enjoy being around.Express your feelings and talk things over with someone you trust.  Counseling or talktherapy with a mental health professional may be helpful if you are having difficulty managing stress on your own. Medicine is typically not recommended for the treatment of stress.Talk to your health care provider if you think you need medicine for symptoms of stress. Follow these instructions at home:  Keep all follow-up visits as directed by your health care provider.  Take all medicines as directed by your health care provider. Contact a health care provider if:  Your symptoms  get worse or you start having new symptoms.  You feel overwhelmed by your problems and can no longer manage them on your own. Get help right away if:  You feel like hurting yourself or someone else. This information is not intended to replace advice given to you by your health care provider. Make sure you discuss any questions you have with your health care provider. Document Released: 05/21/2001 Document Revised: 05/02/2016 Document Reviewed: 07/20/2013 Elsevier Interactive Patient Education  2017 [employer].

## 2017-06-19 NOTE — Progress Notes (Signed)
Pre visit review using our clinic review tool, if applicable. No additional management support is needed unless otherwise documented below in the visit note. 

## 2017-06-23 ENCOUNTER — Other Ambulatory Visit (HOSPITAL_BASED_OUTPATIENT_CLINIC_OR_DEPARTMENT_OTHER): Payer: Medicare Other

## 2017-06-23 DIAGNOSIS — C182 Malignant neoplasm of ascending colon: Secondary | ICD-10-CM

## 2017-06-23 LAB — BASIC METABOLIC PANEL
ANION GAP: 9 meq/L (ref 3–11)
BUN: 18.3 mg/dL (ref 7.0–26.0)
CALCIUM: 9.8 mg/dL (ref 8.4–10.4)
CO2: 24 mEq/L (ref 22–29)
CREATININE: 0.7 mg/dL (ref 0.6–1.1)
Chloride: 105 mEq/L (ref 98–109)
EGFR: 73 mL/min/{1.73_m2} — ABNORMAL LOW (ref >90)
Glucose: 101 mg/dl (ref 70–140)
Potassium: 4.5 mEq/L (ref 3.5–5.1)
SODIUM: 137 meq/L (ref 136–145)

## 2017-06-23 LAB — CEA (IN HOUSE-CHCC)

## 2017-06-24 LAB — CEA (PARALLEL TESTING): CEA: 0.5 ng/mL

## 2017-06-30 ENCOUNTER — Ambulatory Visit (HOSPITAL_COMMUNITY): Payer: Medicare Other

## 2017-06-30 ENCOUNTER — Ambulatory Visit (HOSPITAL_BASED_OUTPATIENT_CLINIC_OR_DEPARTMENT_OTHER): Payer: Medicare Other | Admitting: Nurse Practitioner

## 2017-06-30 VITALS — BP 178/72 | HR 74 | Temp 98.5°F | Resp 17 | Ht 60.0 in | Wt 125.6 lb

## 2017-06-30 DIAGNOSIS — C189 Malignant neoplasm of colon, unspecified: Secondary | ICD-10-CM

## 2017-06-30 DIAGNOSIS — Z85038 Personal history of other malignant neoplasm of large intestine: Secondary | ICD-10-CM

## 2017-06-30 NOTE — Progress Notes (Signed)
Spoke with Carrie Page in central scheduling. Patient's CT scans  are re-scheduled for 07/07/17 @ 10:15. Written instructions given to patient.

## 2017-06-30 NOTE — Progress Notes (Signed)
  [place] OFFICE PROGRESS NOTE   Diagnosis:  Colon cancer  INTERVAL HISTORY:   Carrie Page returns as scheduled. She feels well. No change in bowel habits. No bloody or black stools. No abdominal pain. She has a good appetite.  Following her last visit January 2018 she was referred for surveillance CT scans. This has not been scheduled.  Objective:  Vital signs in last 24 hours:  Blood pressure (!) 178/72, pulse 74, temperature 98.5 F (36.9 C), temperature source Oral, resp. rate 17, height 5' (1.524 m), weight 125 lb 9.6 oz (57 kg), SpO2 99 %.    HEENT: Neck without mass. Lymphatics: No palpable cervical, supra clavicular, axillary or inguinal lymph nodes. Resp: Lungs clear bilaterally. Cardio: Regular rate and rhythm. GI: No hepatosplenomegaly. No mass. Vascular: No leg edema.    Lab Results:  Lab Results  Component Value Date   WBC 7.5 05/07/2016   HGB 13.1 05/07/2016   HCT 38.8 05/07/2016   MCV 87.6 05/07/2016   PLT 213.0 05/07/2016   NEUTROABS 4.0 05/07/2016   06/23/2017 CEA less than 1.0 Imaging:  No results found.  Medications: I have reviewed the patient's current medications.  Assessment/Plan: 1. 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   Cycle 2 adjuvant Xeloda 08/08/2014.   Cycle 3 adjuvant Xeloda 08/29/2014.  Cycle 4 adjuvant Xeloda 09/19/2014.  Cycle 5 adjuvant Xeloda 10/10/2014  Cycle 6 adjuvant Xeloda 10/31/2014  Cycle 7 adjuvant Xeloda 11/21/2014  Cycle 8 adjuvant Xeloda 12/13/2014  Surveillance CT scans 05/26/2015 with no evidence of metastatic disease.  Colonoscopy 08/01/2015. Sessile polyp was found in the rectum; polypectomy was  performed using snare cautery--tubular adenoma. Examination otherwise normal. Next colonoscopy at a 3 year interval.  Surveillance CT scan 05/10/2016-no evidence of metastatic disease 2. History of iron deficiency anemia 3. Early hand-foot syndrome with mild erythema/tenderness of the palms and soles 10/03/2014   Disposition: Carrie Page remains in clinical remission from colon cancer. CEA last week was stable in normal range. She did not have the surveillance CT scans as previously ordered. We will contact radiology to have the scans scheduled in the next one to 2 weeks and contact her with the results. She will return for a follow-up visit and CEA in 6 months.    Ned Card ANP/GNP-BC   06/30/2017  11:49 AM

## 2017-07-07 ENCOUNTER — Encounter (HOSPITAL_COMMUNITY): Payer: Self-pay

## 2017-07-07 ENCOUNTER — Ambulatory Visit (HOSPITAL_COMMUNITY)
Admission: RE | Admit: 2017-07-07 | Discharge: 2017-07-07 | Disposition: A | Discharge status: Home / Self Care | Payer: Medicare Other | Source: Ambulatory Visit | Attending: Oncology | Admitting: Oncology

## 2017-07-07 DIAGNOSIS — C182 Malignant neoplasm of ascending colon: Secondary | ICD-10-CM | POA: Insufficient documentation

## 2017-07-07 DIAGNOSIS — R918 Other nonspecific abnormal finding of lung field: Secondary | ICD-10-CM | POA: Diagnosis not present

## 2017-07-07 DIAGNOSIS — C189 Malignant neoplasm of colon, unspecified: Secondary | ICD-10-CM | POA: Diagnosis not present

## 2017-07-07 MED ORDER — IOPAMIDOL (ISOVUE-300) INJECTION 61%
100.0000 mL | Freq: Once | INTRAVENOUS | Status: AC | PRN
  Administered 2017-07-07: 100 mL via INTRAVENOUS

## 2017-07-07 MED ORDER — IOPAMIDOL (ISOVUE-300) INJECTION 61%
INTRAVENOUS | Status: AC
  Filled 2017-07-07: qty 100

## 2017-07-08 ENCOUNTER — Telehealth: Payer: Self-pay | Admitting: *Deleted

## 2017-07-08 NOTE — Telephone Encounter (Signed)
Telephone call to patient advised imaging results as directed below. Patient verbalized an understanding and will call this office in the interm with any concerns or questions.

## 2017-07-08 NOTE — Telephone Encounter (Signed)
-----   Message from Carrie Pier, MD sent at 07/07/2017  7:41 PM EDT ----- Please call patient, cts are negative for cancer, f/u as scheduled

## 2017-07-21 ENCOUNTER — Ambulatory Visit (INDEPENDENT_AMBULATORY_CARE_PROVIDER_SITE_OTHER)
Admission: RE | Admit: 2017-07-21 | Discharge: 2017-07-21 | Disposition: A | Discharge status: Home / Self Care | Payer: Medicare Other | Source: Ambulatory Visit | Attending: Internal Medicine | Admitting: Internal Medicine

## 2017-07-21 DIAGNOSIS — E2839 Other primary ovarian failure: Secondary | ICD-10-CM | POA: Diagnosis not present

## 2017-07-24 ENCOUNTER — Telehealth: Payer: Self-pay

## 2017-07-24 ENCOUNTER — Encounter: Payer: Self-pay | Admitting: Internal Medicine

## 2017-07-24 ENCOUNTER — Other Ambulatory Visit: Payer: Self-pay | Admitting: Internal Medicine

## 2017-07-24 DIAGNOSIS — M8580 Other specified disorders of bone density and structure, unspecified site: Secondary | ICD-10-CM | POA: Insufficient documentation

## 2017-07-24 MED ORDER — ALENDRONATE SODIUM 70 MG PO TABS: 70.0000 mg | ORAL_TABLET | ORAL | 3 refills | Status: DC

## 2017-07-24 NOTE — Telephone Encounter (Signed)
Pt has been informed and has expressed understanding.  

## 2017-07-24 NOTE — Telephone Encounter (Signed)
-----   Message from Biagio Borg, MD sent at 07/24/2017 12:32 PM EDT ----- Letter sent, cont same tx except  The test results show that your current treatment is OK, except although you do not have full osteoporosis, you do have oestopenia that is quite close.  We should treat with fosamax weekly to help slow this down.  A new prescription will be sent and you should be contacted from the office as well.Carrie Page to please inform pt, I will do rx

## 2017-07-29 NOTE — Telephone Encounter (Signed)
Pt would like a call back she has questions about the [facility]

## 2017-07-29 NOTE — Telephone Encounter (Signed)
Pt would not like to start Fosamax because of what she has read on it. She would rather take calcium in addition to the vit D3 she is taking. I suggested that JJ might want to put her on an alternative and she declined wanting an alternative to the Fosamax also. Please advise.

## 2017-07-29 NOTE — Telephone Encounter (Signed)
Pt has been informed.

## 2017-07-29 NOTE — Telephone Encounter (Signed)
Ok to proceed as per pt desires, thanks

## 2017-08-26 DIAGNOSIS — Z85038 Personal history of other malignant neoplasm of large intestine: Secondary | ICD-10-CM | POA: Diagnosis not present

## 2017-10-22 ENCOUNTER — Telehealth: Payer: Self-pay

## 2017-10-22 ENCOUNTER — Ambulatory Visit (INDEPENDENT_AMBULATORY_CARE_PROVIDER_SITE_OTHER): Payer: Medicare Other | Admitting: Internal Medicine

## 2017-10-22 ENCOUNTER — Encounter: Payer: Self-pay | Admitting: Internal Medicine

## 2017-10-22 ENCOUNTER — Other Ambulatory Visit (INDEPENDENT_AMBULATORY_CARE_PROVIDER_SITE_OTHER): Payer: Medicare Other

## 2017-10-22 VITALS — BP 136/84 | HR 77 | Temp 98.3°F | Ht 60.0 in | Wt 127.0 lb

## 2017-10-22 DIAGNOSIS — R300 Dysuria: Secondary | ICD-10-CM | POA: Diagnosis not present

## 2017-10-22 DIAGNOSIS — R7302 Impaired glucose tolerance (oral): Secondary | ICD-10-CM | POA: Diagnosis not present

## 2017-10-22 DIAGNOSIS — M519 Unspecified thoracic, thoracolumbar and lumbosacral intervertebral disc disorder: Secondary | ICD-10-CM | POA: Diagnosis not present

## 2017-10-22 DIAGNOSIS — Z23 Encounter for immunization: Secondary | ICD-10-CM

## 2017-10-22 DIAGNOSIS — I10 Essential (primary) hypertension: Secondary | ICD-10-CM

## 2017-10-22 LAB — URINALYSIS, ROUTINE W REFLEX MICROSCOPIC
Bilirubin Urine: NEGATIVE
Ketones, ur: NEGATIVE
Nitrite: NEGATIVE
PH: 6 (ref 5.0–8.0)
SPECIFIC GRAVITY, URINE: 1.01 (ref 1.000–1.030)
TOTAL PROTEIN, URINE-UPE24: NEGATIVE
Urine Glucose: NEGATIVE
Urobilinogen, UA: 0.2 (ref 0.0–1.0)

## 2017-10-22 MED ORDER — LEVOFLOXACIN 250 MG PO TABS: 250.0000 mg | ORAL_TABLET | Freq: Every day | ORAL | 0 refills | Status: AC

## 2017-10-22 MED ORDER — TIZANIDINE HCL 4 MG PO TABS: 4.0000 mg | ORAL_TABLET | Freq: Four times a day (QID) | ORAL | 1 refills | Status: DC | PRN

## 2017-10-22 NOTE — Telephone Encounter (Signed)
-----   Message from Biagio Borg, MD sent at 10/22/2017 12:51 PM EST ----- Ok for Carrie Page to let pt know - the initial urine test is negative  Ok to continue current tx as it will take 2-3 days for the culture to result

## 2017-10-22 NOTE — Patient Instructions (Addendum)
You had the flu shot today  Please take all new medication as prescribed - the muscle relaxer, and the antibiotic  Please continue all other medications as before, and refills have been done if requested.  Please have the pharmacy call with any other refills you may need.  Please keep your appointments with your specialists as you may have planned  Please go to the LAB in the Basement (turn left off the elevator) for the tests to be done today  You will be contacted by phone if any changes need to be made immediately.  Otherwise, you will receive a letter about your results with an explanation, but please check with MyChart first.  Please remember to sign up for MyChart if you have not done so, as this will be important to you in the future with finding out test results, communicating by private email, and scheduling acute appointments online when needed.

## 2017-10-22 NOTE — Telephone Encounter (Signed)
Pt has been informed and expressed understanding.  

## 2017-10-22 NOTE — Progress Notes (Signed)
Subjective:    Patient ID: Carrie Page, female    DOB: 08/01/1928, 83 y.o.   MRN: [id number]  HPI  Here with 2-3 days acute onset right lower back pain with radiation to the right lower quadrant, mild to mod, sharp, without radiation and seemed to start  after moving furtniture x 2 wk ago.  No falls, swelling, rash. She is concerned about UTI with very small dysuria, but Denies urinary symptoms such as frequency, urgency, hematuria or n/v, fever, chills. Pt denies chest pain, increased sob or doe, wheezing, orthopnea, PND, increased LE swelling, palpitations, dizziness or syncope.  Pt denies new neurological symptoms such as new headache, or facial or extremity weakness or numbness   Past Medical History:  Diagnosis Date  . Allergic rhinitis, cause unspecified 01/28/2012  . Allergy   . Anemia   . Anxiety 01/28/2012  . Arthritis   . Cecal cancer ([place])   . Degenerative joint disease 01/28/2012  . Depression   . History of nonmelanoma skin cancer   . Hypertension   . Impaired glucose tolerance   . Osteoporosis    RT SHOULDER  . PONV (postoperative nausea and vomiting)    Past Surgical History:  Procedure Laterality Date  . ABDOMINAL HYSTERECTOMY    . APPENDECTOMY    . BACK SURGERY  2010   MICRODCISCECTOMY  . CHOLECYSTECTOMY    . LAPAROSCOPIC ASSISTED RIGHT PARTIAL COLECTOMY N/A 06/20/2014   Performed by Leighton Ruff, MD at [facility]  . TONSILLECTOMY      reports that  has never smoked. she has never used smokeless tobacco. She reports that she does not drink alcohol or use drugs. family history includes Arthritis in her other; Colon cancer in her father; Heart disease in her other; Stroke in her other. Allergies  Allergen Reactions  . Bactrim [Sulfamethoxazole-Trimethoprim] Itching, Rash and Other (See Comments)    Rash, itching, diaphoretic  . Lovastatin Other (See Comments)    Cognitive slowing and myalgias  . Nitrofurantoin Other (See Comments)    GI upset  . Penicillins  Itching  . Pravastatin Other (See Comments)    Myalgias, increased dreams  . Naproxen Rash   Current Outpatient Medications on File Prior to Visit  Medication Sig Dispense Refill  . alendronate (FOSAMAX) 70 MG tablet Take 1 tablet (70 mg total) by mouth every 7 (seven) days. Take with a full glass of water on an empty stomach. 12 tablet 3  . aspirin EC 81 MG tablet Take 81 mg by mouth daily.    . Cholecalciferol (VITAMIN D3) 1000 UNITS CAPS Take 1 capsule by mouth daily.    . Cyanocobalamin (VITAMIN B 12 PO) Take 1 tablet by mouth daily.    . diazepam (VALIUM) 5 MG tablet Take 1 tablet (5 mg total) by mouth daily as needed for anxiety. 30 tablet 2  . irbesartan (AVAPRO) 300 MG tablet Take 1 tablet (300 mg total) by mouth every morning. 90 tablet 3   No current facility-administered medications on file prior to visit.    Review of Systems  Constitutional: Negative for other unusual diaphoresis or sweats HENT: Negative for ear discharge or swelling Eyes: Negative for other worsening visual disturbances Respiratory: Negative for stridor or other swelling  Gastrointestinal: Negative for worsening distension or other blood Genitourinary: Negative for retention or other urinary change Musculoskeletal: Negative for other MSK pain or swelling Skin: Negative for color change or other new lesions Neurological: Negative for worsening tremors and other numbness  Psychiatric/Behavioral: Negative for worsening agitation or other fatigue All other system neg per pt    Objective:   Physical Exam BP 136/84   Pulse 77   Temp 98.3 F (36.8 C) (Oral)   Ht 5' (1.524 m)   Wt 127 lb (57.6 kg)   SpO2 99%   BMI 24.80 kg/m   VS noted,  Constitutional: Pt appears in NAD HENT: Head: NCAT.  Right Ear: External ear normal.  Left Ear: External ear normal.  Eyes: . Pupils are equal, round, and reactive to light. Conjunctivae and EOM are normal Nose: without d/c or deformity Neck: Neck supple. Gross  normal ROM Cardiovascular: Normal rate and regular rhythm.   Pulmonary/Chest: Effort normal and breath sounds without rales or wheezing.  Spine; nontender midline and paravertebral  Abd:  Soft, NT, ND, + BS, no organomegaly Neurological: Pt is alert. At baseline orientation, motor grossly intact Skin: Skin is warm. No rashes, other new lesions, no LE edema Psychiatric: Pt behavior is normal without agitation  No other exam findings       Assessment & Plan:

## 2017-10-23 ENCOUNTER — Encounter: Payer: Self-pay | Admitting: Internal Medicine

## 2017-10-23 LAB — URINE CULTURE
MICRO NUMBER: 81283895
SPECIMEN QUALITY:: ADEQUATE

## 2017-10-25 NOTE — Assessment & Plan Note (Signed)
Lab Results  Component Value Date   HGBA1C 6.2 03/19/2016  stable overall by history and exam, recent data reviewed with pt, and pt to continue medical treatment as before,  to f/u any worsening symptoms or concerns

## 2017-10-25 NOTE — Assessment & Plan Note (Signed)
I suspect this is more likely the cause of her pain, no neuro change, overall mild, for tizanidine trial, cont to monitor

## 2017-10-25 NOTE — Assessment & Plan Note (Signed)
Mild, ? Clinical significance, for urine studies as above

## 2017-10-25 NOTE — Assessment & Plan Note (Signed)
stable overall by history and exam, recent data reviewed with pt, and pt to continue medical treatment as before,  to f/u any worsening symptoms or concerns BP Readings from Last 3 Encounters:  10/22/17 136/84  06/30/17 (!) 178/72  06/19/17 134/62

## 2017-12-29 ENCOUNTER — Inpatient Hospital Stay: Payer: Medicare Other

## 2017-12-29 ENCOUNTER — Telehealth: Payer: Self-pay | Admitting: Oncology

## 2017-12-29 ENCOUNTER — Inpatient Hospital Stay: Payer: Medicare Other | Attending: Oncology | Admitting: Oncology

## 2017-12-29 VITALS — BP 199/71 | HR 76 | Temp 97.7°F | Resp 18 | Ht 60.0 in | Wt 127.2 lb

## 2017-12-29 DIAGNOSIS — D509 Iron deficiency anemia, unspecified: Secondary | ICD-10-CM

## 2017-12-29 DIAGNOSIS — C182 Malignant neoplasm of ascending colon: Secondary | ICD-10-CM | POA: Diagnosis not present

## 2017-12-29 DIAGNOSIS — I10 Essential (primary) hypertension: Secondary | ICD-10-CM

## 2017-12-29 DIAGNOSIS — L271 Localized skin eruption due to drugs and medicaments taken internally: Secondary | ICD-10-CM | POA: Diagnosis not present

## 2017-12-29 DIAGNOSIS — C189 Malignant neoplasm of colon, unspecified: Secondary | ICD-10-CM

## 2017-12-29 DIAGNOSIS — K271 Acute peptic ulcer, site unspecified, with perforation: Secondary | ICD-10-CM

## 2017-12-29 LAB — CEA (IN HOUSE-CHCC): CEA (CHCC-IN HOUSE): 1.03 ng/mL (ref 0.00–5.00)

## 2017-12-29 NOTE — Progress Notes (Signed)
  [facility] OFFICE PROGRESS NOTE   Diagnosis: Colon cancer  INTERVAL HISTORY:   Carrie Page returns as scheduled.  Restaging CTs 07/07/2017 were negative.  She feels well.  No difficulty with bowel or bladder function.  She has pain in the knees.  No other pain.  Objective:  Vital signs in last 24 hours:  Blood pressure (!) 199/71, pulse 76, temperature 97.7 F (36.5 C), temperature source Oral, resp. rate 18, height 5' (1.524 m), weight 127 lb 3.2 oz (57.7 kg), SpO2 99 %.    HEENT: Neck without mass Lymphatics: No cervical, supraclavicular, or axillary nodes Resp: Lungs clear bilaterally Cardio: Regular rate and rhythm GI: No hepatosplenomegaly, nontender, no mass Vascular: No leg edema     Lab Results:  Lab Results  Component Value Date   CEA1 <1.00 06/23/2017    Medications: I have reviewed the patient's current medications.   Assessment/Plan: 1. 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   Cycle 2 adjuvant Xeloda 08/08/2014.   Cycle 3 adjuvant Xeloda 08/29/2014.  Cycle 4 adjuvant Xeloda 09/19/2014.  Cycle 5 adjuvant Xeloda 10/10/2014  Cycle 6 adjuvant Xeloda 10/31/2014  Cycle 7 adjuvant Xeloda 11/21/2014  Cycle 8 adjuvant Xeloda 12/13/2014  Surveillance CT scans 05/26/2015 with no evidence of metastatic disease.  Colonoscopy 08/01/2015. Sessile polyp was found in the rectum; polypectomy was performed using snare cautery--tubular adenoma. Examination otherwise normal. Next colonoscopy at a 3 year interval.  Surveillance CT scan 05/10/2016-no evidence of metastatic disease  Surveillance CT scan 07/07/2018-no evidence of metastatic disease 2. History of iron  deficiency anemia 3. Early hand-foot syndrome with mild erythema/tenderness of the palms and soles 10/03/2014   Disposition: Carrie Page remains in clinical remission from colon cancer.  We will follow-up on the CEA from today.  She will return for an office visit and CEA in 6 months.  She has systolic hypertension today.  We repeated the blood pressure and the systolic remained elevated.  We recommended she follow-up with Dr. Jenny Reichmann if the systolic blood pressure remains elevated when she checks it at home.  15 minutes were spent with the patient today.  The majority of the time was used for counseling and coordination of care.  Betsy Coder, MD  12/29/2017  11:25 AM

## 2017-12-29 NOTE — Telephone Encounter (Signed)
Gave patient avs and calendar with appts per 1/21 los.  °

## 2017-12-30 ENCOUNTER — Telehealth: Payer: Self-pay | Admitting: Emergency Medicine

## 2017-12-30 NOTE — Telephone Encounter (Signed)
Pt returned called.  Called pt back and left message on voice mail re:  CEA level normal as per Dr. Benay Spice.

## 2017-12-30 NOTE — Telephone Encounter (Addendum)
Left VM with patient to call back regarding this note.   ----- Message from Ladell Pier, MD sent at 12/29/2017  4:16 PM EST ----- Please call patient, cea is normal

## 2018-01-01 ENCOUNTER — Telehealth: Payer: Self-pay | Admitting: Internal Medicine

## 2018-01-01 MED ORDER — LOSARTAN POTASSIUM 100 MG PO TABS: 100.0000 mg | ORAL_TABLET | Freq: Every day | ORAL | 3 refills | Status: DC

## 2018-01-01 NOTE — Telephone Encounter (Signed)
Ok to change to losartan 100 - done erx

## 2018-01-01 NOTE — Telephone Encounter (Signed)
Copied from [place] [phone]. Topic: Quick Communication - See Telephone Encounter >> Jan 01, 2018  3:57 PM Ether Griffins B wrote: CRM for notification. See Telephone encounter for:  Pt is taking irbesartan seen the recall she has contacted her pharmacy this morning and they told her the medication they have is ok. Then at lunch she saw it on the news again and it said if you take this medication to contact your doctor. She is wanting to know if she needs to be switched to something else.  01/01/18.

## 2018-01-02 NOTE — Telephone Encounter (Addendum)
Pt has been informed and expressed understanding.  

## 2018-02-27 IMAGING — CT CT CHEST W/ CM
2 of 5 series · 12 of 36 positions shown, 15 images · IV contrast (ISOVUE 300)
Comparison: 05/10/2016

CLINICAL DATA: Restaging colon cancer

EXAM:
CT CHEST, ABDOMEN, AND PELVIS WITH CONTRAST
TECHNIQUE: Multidetector CT imaging of the chest, abdomen and pelvis was
performed following the standard protocol during bolus
administration of intravenous contrast.
CONTRAST:  100mL 1JST3M-4OO IOPAMIDOL (1JST3M-4OO) INJECTION 61%

[Series 2: cap with · axial · 0.63mm/px · z∈[+1091,+1591]mm · 9 of 126 slices shown, 12 images]
[im 13/126  mediastinal]
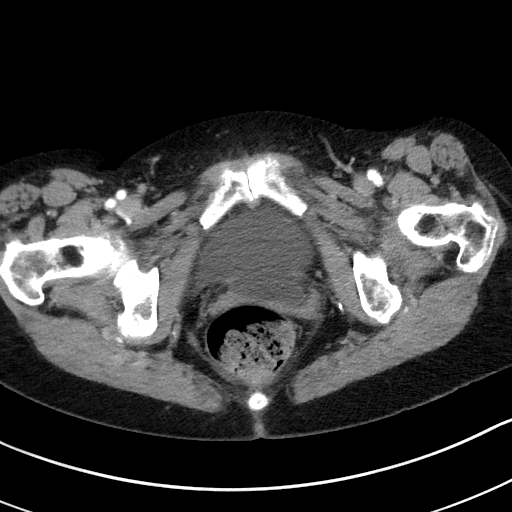
[im 13/126  lung]
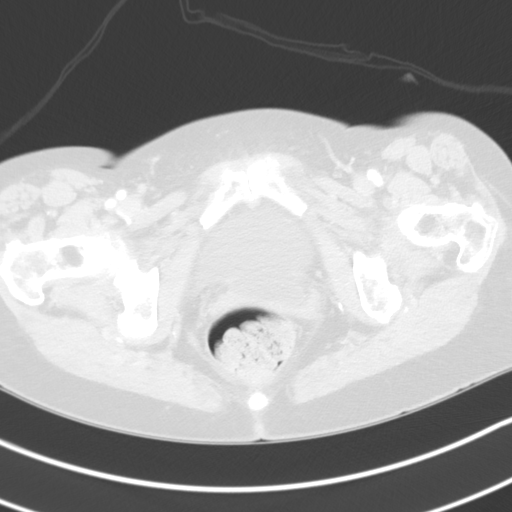
[im 26/126  lung]
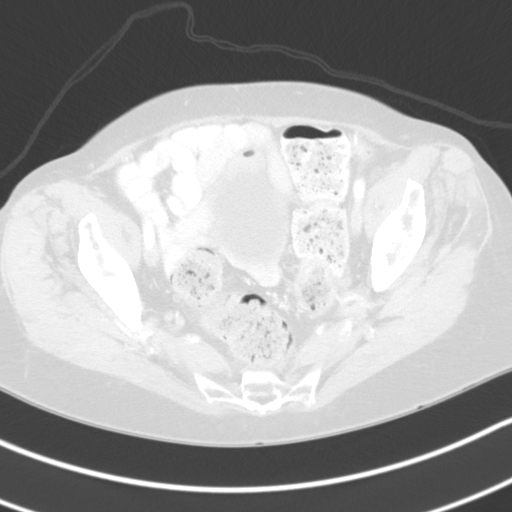
[im 38/126  lung]
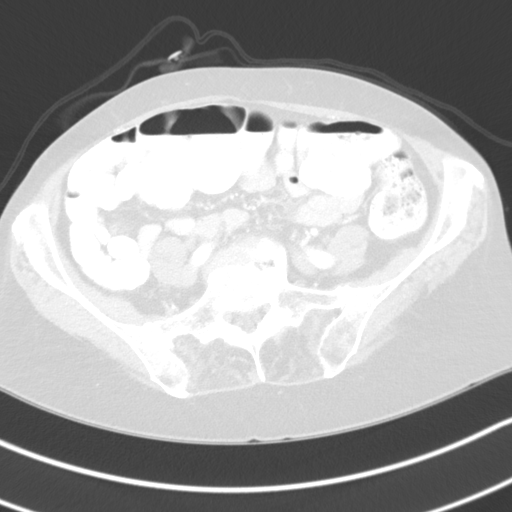
[im 51/126  lung]
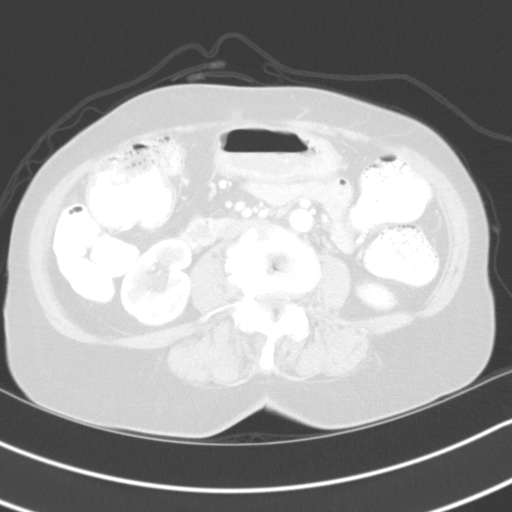
[im 63/126  mediastinal]
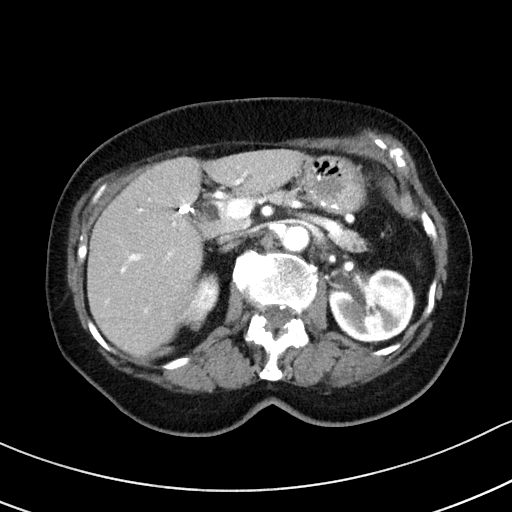
[im 63/126  lung]
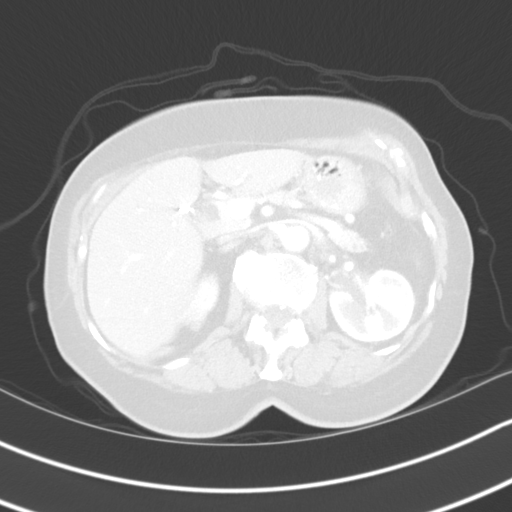
[im 76/126  lung]
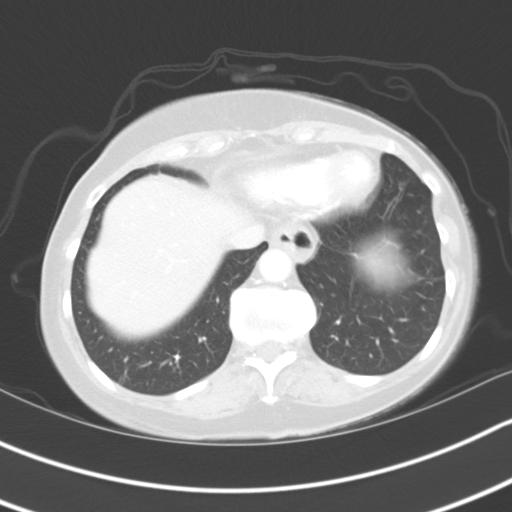
[im 88/126  lung]
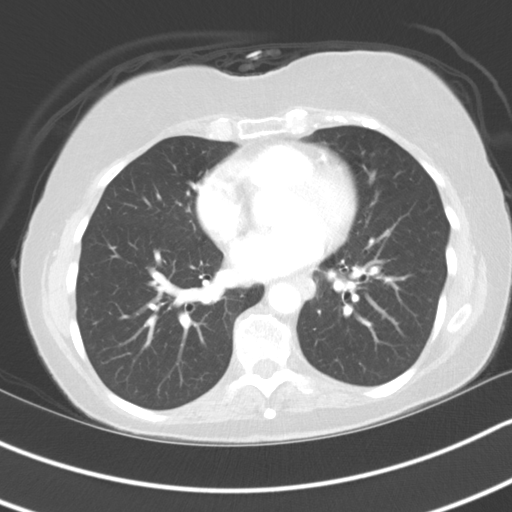
[im 101/126  lung]
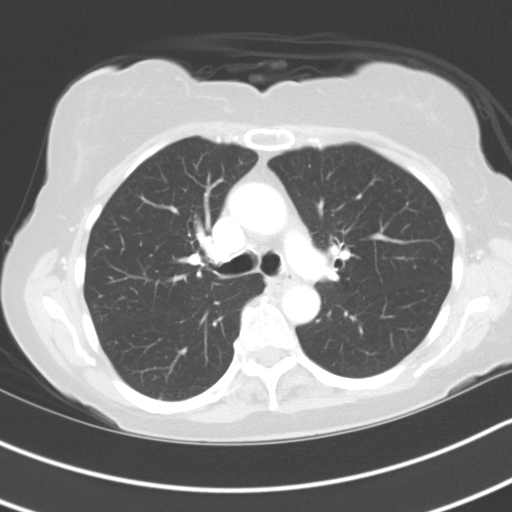
[im 113/126  mediastinal]
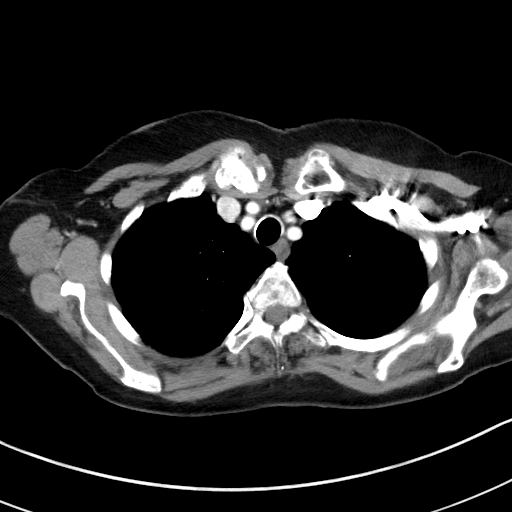
[im 113/126  lung]
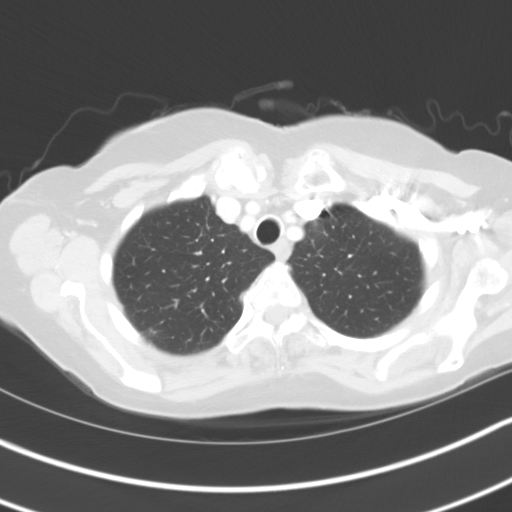

[Series 4: coronals · coronal · 0.82mm/px · 3 of 108 slices shown]
[im 22/108  lung]
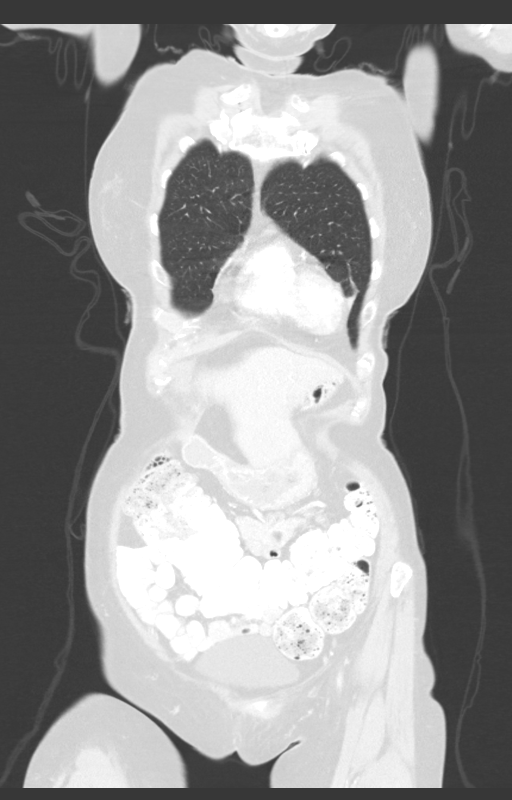
[im 43/108  lung]
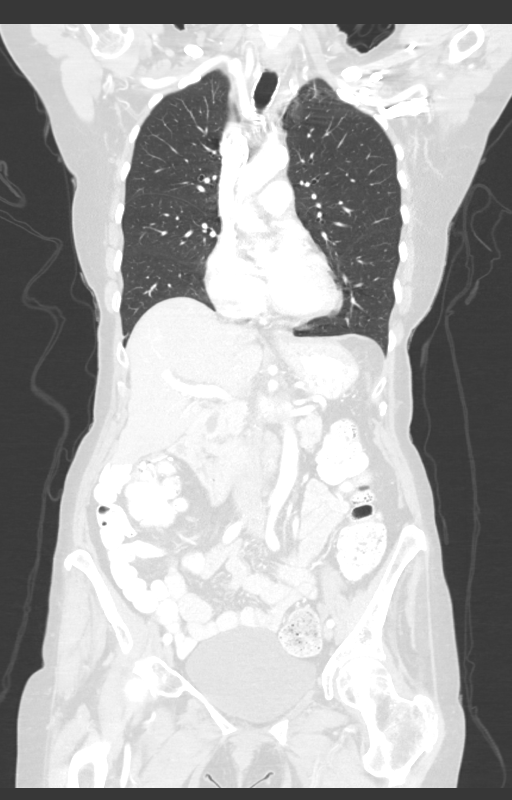
[im 65/108  lung]
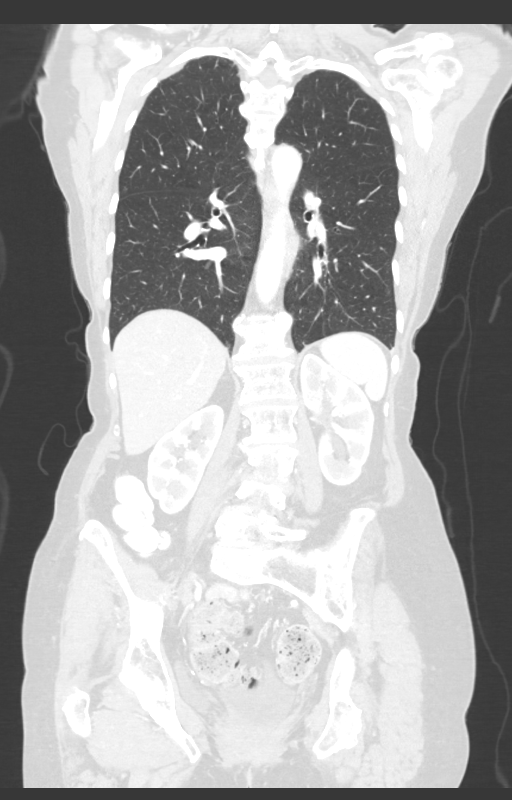

[12 of 36 positions shown; findings below may reference images not displayed]

FINDINGS: CT CHEST FINDINGS

Cardiovascular: Normal heart size. Aortic atherosclerosis.
Calcification in the LAD coronary artery noted. No pericardial
effusion.

Mediastinum/Nodes: The trachea appears patent and is midline. Normal
appearance of the esophagus. No significant mediastinal or hilar
nodal metastases. No axillary or supraclavicular adenopathy.

Lungs/Pleura: No pleural effusion identified. Scar identified within
the posterolateral right lower lobe. Stable faint nodule within the
right upper lobe measuring 3 mm, image number 50 of series 6. 4 mm
posterior right lung base nodule is also stable, image 127 of series
6.

Musculoskeletal: There is degenerative disc disease identified
within the thoracic spine. No aggressive lytic or sclerotic bone
lesions.

CT ABDOMEN PELVIS FINDINGS

Hepatobiliary: No focal liver abnormality. Previous cholecystectomy.
Stable mild dilatation of the intra and extrahepatic bile ducts.

Pancreas: Unremarkable. No pancreatic ductal dilatation or
surrounding inflammatory changes.

Spleen: Similar appearance of calcified splenic artery aneurysm. The
spleen is normal in size. No suspicious splenic abnormality.

Adrenals/Urinary Tract: The adrenal glands are normal. Unremarkable
appearance of the kidneys. No mass or hydronephrosis. Similar
appearance of pelvic floor laxity and cystocele.

Stomach/Bowel: The stomach appears normal. The small bowel loops
have a normal course and caliber without obstruction. Status post
right hemicolectomy. No pathologic dilatation of the colon.

Vascular/Lymphatic: Aortic atherosclerosis. No aneurysm. No enlarged
upper abdominal lymph nodes. No mesenteric adenopathy. No pelvic or
inguinal adenopathy.

Reproductive: Status post hysterectomy. No adnexal masses.

Other: No abdominal wall hernia or abnormality. No abdominopelvic
ascites.

Musculoskeletal: Degenerative disc disease identified within the
lumbar spine. Mild scoliosis.
IMPRESSION: 1. Stable small subcentimeter right lung nodules.
2. No specific findings identified to suggest metastatic disease
within the chest, abdomen or pelvis.
3. Stable appearance of the colon status post right hemicolectomy.

## 2018-03-26 ENCOUNTER — Other Ambulatory Visit (INDEPENDENT_AMBULATORY_CARE_PROVIDER_SITE_OTHER): Payer: Medicare Other

## 2018-03-26 ENCOUNTER — Ambulatory Visit (INDEPENDENT_AMBULATORY_CARE_PROVIDER_SITE_OTHER): Payer: Medicare Other | Admitting: Internal Medicine

## 2018-03-26 ENCOUNTER — Encounter: Payer: Self-pay | Admitting: Internal Medicine

## 2018-03-26 VITALS — BP 144/86 | HR 74 | Temp 98.1°F | Ht 60.0 in | Wt 126.0 lb

## 2018-03-26 DIAGNOSIS — Z0000 Encounter for general adult medical examination without abnormal findings: Secondary | ICD-10-CM

## 2018-03-26 LAB — URINALYSIS, ROUTINE W REFLEX MICROSCOPIC
Bilirubin Urine: NEGATIVE
Ketones, ur: NEGATIVE
Nitrite: NEGATIVE
PH: 5.5 (ref 5.0–8.0)
SPECIFIC GRAVITY, URINE: 1.025 (ref 1.000–1.030)
Total Protein, Urine: NEGATIVE
Urine Glucose: NEGATIVE
Urobilinogen, UA: 0.2 (ref 0.0–1.0)

## 2018-03-26 LAB — CBC WITH DIFFERENTIAL/PLATELET
BASOS PCT: 1 % (ref 0.0–3.0)
Basophils Absolute: 0.1 10*3/uL (ref 0.0–0.1)
EOS PCT: 3.8 % (ref 0.0–5.0)
Eosinophils Absolute: 0.2 10*3/uL (ref 0.0–0.7)
HEMATOCRIT: 38.4 % (ref 36.0–46.0)
HEMOGLOBIN: 12.9 g/dL (ref 12.0–15.0)
LYMPHS PCT: 39.2 % (ref 12.0–46.0)
Lymphs Abs: 2.2 10*3/uL (ref 0.7–4.0)
MCHC: 33.7 g/dL (ref 30.0–36.0)
MCV: 88.1 fl (ref 78.0–100.0)
MONOS PCT: 6.6 % (ref 3.0–12.0)
Monocytes Absolute: 0.4 10*3/uL (ref 0.1–1.0)
Neutro Abs: 2.8 10*3/uL (ref 1.4–7.7)
Neutrophils Relative %: 49.4 % (ref 43.0–77.0)
Platelets: 201 10*3/uL (ref 150.0–400.0)
RBC: 4.36 Mil/uL (ref 3.87–5.11)
RDW: 13.9 % (ref 11.5–15.5)
WBC: 5.6 10*3/uL (ref 4.0–10.5)

## 2018-03-26 LAB — LIPID PANEL
CHOLESTEROL: 201 mg/dL — AB (ref 0–200)
HDL: 63 mg/dL (ref >39.00)
LDL Cholesterol: 109 mg/dL — ABNORMAL HIGH (ref 0–99)
NONHDL: 137.62
Total CHOL/HDL Ratio: 3
Triglycerides: 141 mg/dL (ref 0.0–149.0)
VLDL: 28.2 mg/dL (ref 0.0–40.0)

## 2018-03-26 LAB — HEPATIC FUNCTION PANEL
ALBUMIN: 4.2 g/dL (ref 3.5–5.2)
ALT: 12 U/L (ref 0–35)
AST: 18 U/L (ref 0–37)
Alkaline Phosphatase: 76 U/L (ref 39–117)
BILIRUBIN TOTAL: 0.6 mg/dL (ref 0.2–1.2)
Bilirubin, Direct: 0.1 mg/dL (ref 0.0–0.3)
TOTAL PROTEIN: 7.1 g/dL (ref 6.0–8.3)

## 2018-03-26 LAB — BASIC METABOLIC PANEL
BUN: 23 mg/dL (ref 6–23)
CALCIUM: 9.5 mg/dL (ref 8.4–10.5)
CO2: 29 mEq/L (ref 19–32)
Chloride: 103 mEq/L (ref 96–112)
Creatinine, Ser: 0.62 mg/dL (ref 0.40–1.20)
GFR: 96.68 mL/min (ref >60.00)
GLUCOSE: 107 mg/dL — AB (ref 70–99)
Potassium: 4.2 mEq/L (ref 3.5–5.1)
SODIUM: 137 meq/L (ref 135–145)

## 2018-03-26 LAB — TSH: TSH: 1.41 u[IU]/mL (ref 0.35–4.50)

## 2018-03-26 NOTE — Patient Instructions (Signed)

## 2018-03-26 NOTE — Progress Notes (Signed)
Subjective:    Patient ID: Carrie Page, female    DOB: Mar 02, 1928, 84 y.o.   MRN: [id number]  HPI  Here for wellness and f/u;  Overall doing ok;  Pt denies Chest pain, worsening SOB, DOE, wheezing, orthopnea, PND, worsening LE edema, palpitations, dizziness or syncope.  Pt denies neurological change such as new headache, facial or extremity weakness.  Pt denies polydipsia, polyuria, or low sugar symptoms. Pt states overall good compliance with treatment and medications, good tolerability, and has been trying to follow appropriate diet.  Pt denies worsening depressive symptoms, suicidal ideation or panic. No fever, night sweats, wt loss, loss of appetite, or other constitutional symptoms.  Pt states good ability with ADL's, has low fall risk, home safety reviewed and adequate, no other significant changes in hearing or vision, and only occasionally active with exercise.  Plans to make optho f/u soon, but finances are a problem, declines shingrix.  Never started the fosamax, too wary of side effects.  No other new complaints or interval hx Past Medical History:  Diagnosis Date  . Allergic rhinitis, cause unspecified 01/28/2012  . Allergy   . Anemia   . Anxiety 01/28/2012  . Arthritis   . Cecal cancer ([place])   . Degenerative joint disease 01/28/2012  . Depression   . History of nonmelanoma skin cancer   . Hypertension   . Impaired glucose tolerance   . Osteoporosis    RT SHOULDER  . PONV (postoperative nausea and vomiting)    Past Surgical History:  Procedure Laterality Date  . ABDOMINAL HYSTERECTOMY    . APPENDECTOMY    . BACK SURGERY  2010   MICRODCISCECTOMY  . CHOLECYSTECTOMY    . LAPAROSCOPIC PARTIAL COLECTOMY N/A 06/20/2014   Procedure: LAPAROSCOPIC ASSISTED RIGHT PARTIAL COLECTOMY;  Surgeon: Leighton Ruff, MD;  Location: [facility] ORS;  Service: General;  Laterality: N/A;  . TONSILLECTOMY      reports that she has never smoked. She has never used smokeless tobacco. She reports that she  does not drink alcohol or use drugs. family history includes Arthritis in her other; Colon cancer in her father; Heart disease in her other; Stroke in her other. Allergies  Allergen Reactions  . Bactrim [Sulfamethoxazole-Trimethoprim] Itching, Rash and Other (See Comments)    Rash, itching, diaphoretic  . Lovastatin Other (See Comments)    Cognitive slowing and myalgias  . Nitrofurantoin Other (See Comments)    GI upset  . Penicillins Itching  . Pravastatin Other (See Comments)    Myalgias, increased dreams  . Naproxen Rash   Current Outpatient Medications on File Prior to Visit  Medication Sig Dispense Refill  . aspirin EC 81 MG tablet Take 81 mg by mouth daily.    . Cholecalciferol (VITAMIN D3) 1000 UNITS CAPS Take 1 capsule by mouth daily.    . Cyanocobalamin (VITAMIN B 12 PO) Take 1 tablet by mouth daily.    . diazepam (VALIUM) 5 MG tablet Take 1 tablet (5 mg total) by mouth daily as needed for anxiety. 30 tablet 2  . losartan (COZAAR) 100 MG tablet Take 1 tablet (100 mg total) by mouth daily. 90 tablet 3   No current facility-administered medications on file prior to visit.    Review of Systems Constitutional: Negative for other unusual diaphoresis, sweats, appetite or weight changes HENT: Negative for other worsening hearing loss, ear pain, facial swelling, mouth sores or neck stiffness.   Eyes: Negative for other worsening pain, redness or other visual disturbance.  Respiratory:  Negative for other stridor or swelling Cardiovascular: Negative for other palpitations or other chest pain  Gastrointestinal: Negative for worsening diarrhea or loose stools, blood in stool, distention or other pain Genitourinary: Negative for hematuria, flank pain or other change in urine volume.  Musculoskeletal: Negative for myalgias or other joint swelling.  Skin: Negative for other color change, or other wound or worsening drainage.  Neurological: Negative for other syncope or  numbness. Hematological: Negative for other adenopathy or swelling Psychiatric/Behavioral: Negative for hallucinations, other worsening agitation, SI, self-injury, or new decreased concentration All other system neg pe rpt    Objective:   Physical Exam BP (!) 144/86   Pulse 74   Temp 98.1 F (36.7 C) (Oral)   Ht 5' (1.524 m)   Wt 126 lb (57.2 kg)   SpO2 98%   BMI 24.61 kg/m  VS noted,  Constitutional: Pt is oriented to person, place, and time. Appears well-developed and well-nourished, in no significant distress and comfortable Head: Normocephalic and atraumatic  Eyes: Conjunctivae and EOM are normal. Pupils are equal, round, and reactive to light Right Ear: External ear normal without discharge Left Ear: External ear normal without discharge Nose: Nose without discharge or deformity Mouth/Throat: Oropharynx is without other ulcerations and moist  Neck: Normal range of motion. Neck supple. No JVD present. No tracheal deviation present or significant neck LA or mass Cardiovascular: Normal rate, regular rhythm, normal heart sounds and intact distal pulses.   Pulmonary/Chest: WOB normal and breath sounds without rales or wheezing  Abdominal: Soft. Bowel sounds are normal. NT. No HSM  Musculoskeletal: Normal range of motion. Exhibits no edema Lymphadenopathy: Has no other cervical adenopathy.  Neurological: Pt is alert and oriented to person, place, and time. Pt has normal reflexes. No cranial nerve deficit. Motor grossly intact, Gait intact Skin: Skin is warm and dry. No rash noted or new ulcerations Psychiatric:  Has normal mood and affect. Behavior is normal without agitation No other exam findings     Assessment & Plan:

## 2018-03-27 NOTE — Assessment & Plan Note (Signed)
,  Overall doing well, age appropriate education and counseling updated, referrals for preventative services and immunizations addressed, dietary and smoking counseling addressed, most recent labs reviewed.  I have personally reviewed and have noted:  1) the patient's medical and social history 2) The pt's use of alcohol, tobacco, and illicit drugs 3) The patient's current medications and supplements 4) Functional ability including ADL's, fall risk, home safety risk, hearing and visual impairment 5) Diet and physical activities 6) Evidence for depression or mood disorder 7) The patient's height, weight, and BMI have been recorded in the chart  I have made referrals, and provided counseling and education based on review of the above  

## 2018-07-06 ENCOUNTER — Telehealth: Payer: Self-pay | Admitting: Emergency Medicine

## 2018-07-06 ENCOUNTER — Inpatient Hospital Stay: Payer: Medicare Other | Attending: Nurse Practitioner | Admitting: Nurse Practitioner

## 2018-07-06 ENCOUNTER — Inpatient Hospital Stay: Payer: Medicare Other

## 2018-07-06 ENCOUNTER — Telehealth: Payer: Self-pay

## 2018-07-06 ENCOUNTER — Encounter: Payer: Self-pay | Admitting: Nurse Practitioner

## 2018-07-06 VITALS — BP 160/80 | HR 85 | Temp 98.0°F | Resp 17 | Ht 60.0 in | Wt 125.1 lb

## 2018-07-06 DIAGNOSIS — C189 Malignant neoplasm of colon, unspecified: Secondary | ICD-10-CM

## 2018-07-06 DIAGNOSIS — C182 Malignant neoplasm of ascending colon: Secondary | ICD-10-CM

## 2018-07-06 DIAGNOSIS — Z9221 Personal history of antineoplastic chemotherapy: Secondary | ICD-10-CM

## 2018-07-06 DIAGNOSIS — Z85048 Personal history of other malignant neoplasm of rectum, rectosigmoid junction, and anus: Secondary | ICD-10-CM | POA: Insufficient documentation

## 2018-07-06 DIAGNOSIS — D509 Iron deficiency anemia, unspecified: Secondary | ICD-10-CM | POA: Insufficient documentation

## 2018-07-06 LAB — CEA (IN HOUSE-CHCC): CEA (CHCC-In House): 1.06 ng/mL (ref 0.00–5.00)

## 2018-07-06 NOTE — Progress Notes (Signed)
  [facility] OFFICE PROGRESS NOTE   Diagnosis: Colon cancer  INTERVAL HISTORY:   Ms. Mealy returns as scheduled.  She feels well.  No change in bowel habits.  No bleeding or pain with bowel movements.  She reports a good appetite.  She reports normal blood pressure readings at home.  Objective:  Vital signs in last 24 hours:  Blood pressure (!) 160/80, pulse 85, temperature 98 F (36.7 C), temperature source Oral, resp. rate 17, height 5' (1.524 m), weight 125 lb 1.6 oz (56.7 kg), SpO2 100 %.    HEENT: Neck without mass. Lymphatics: No palpable cervical, supraclavicular, axillary or inguinal lymph nodes. Resp: Lungs clear bilaterally. Cardio: Regular rate and rhythm. GI: Abdomen soft and nontender.  No hepatomegaly.  No mass. Vascular: No leg edema.  Calves soft and nontender.   Lab Results:  Lab Results  Component Value Date   WBC 5.6 03/26/2018   HGB 12.9 03/26/2018   HCT 38.4 03/26/2018   MCV 88.1 03/26/2018   PLT 201.0 03/26/2018   NEUTROABS 2.8 03/26/2018    Imaging:  No results found.  Medications: I have reviewed the patient's current medications.  Assessment/Plan: 1. 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   Cycle 2 adjuvant Xeloda 08/08/2014.   Cycle 3 adjuvant Xeloda 08/29/2014.  Cycle 4 adjuvant Xeloda 09/19/2014  Cycle 5 adjuvant Xeloda 10/10/2014  Cycle 6 adjuvant Xeloda 10/31/2014  Cycle 7 adjuvant Xeloda 11/21/2014  Cycle 8 adjuvant Xeloda 12/13/2014  Surveillance CT scans 05/26/2015 with no evidence of metastatic disease.  Colonoscopy 08/01/2015. Sessile polyp was found in the rectum; polypectomy was performed using snare  cautery--tubular adenoma. Examination otherwise normal. Next colonoscopy at a 3 year interval.  Surveillance CT scan 05/10/2016-no evidence of metastatic disease  Surveillance CT scan 07/07/2017-no evidence of metastatic disease 2. History of iron deficiency anemia 3. Early hand-foot syndrome with mild erythema/tenderness of the palms and soles 10/03/2014    Disposition: Ms. Slaven remains in clinical remission from colon cancer.  We will follow-up on the CEA from today.  She will return for CEA and follow-up visit in 6 months.  She will contact the office in the interim with any problems.    Ned Card ANP/GNP-BC   07/06/2018  12:47 PM

## 2018-07-06 NOTE — Telephone Encounter (Signed)
Printed avs and calender of upcoming appointment. Per 7/29 los 

## 2018-07-06 NOTE — Telephone Encounter (Signed)
Pt verbalized understanding of normal CEA

## 2018-08-06 ENCOUNTER — Other Ambulatory Visit: Payer: Self-pay | Admitting: Internal Medicine

## 2018-08-06 NOTE — Telephone Encounter (Signed)
Done erx 

## 2018-09-01 ENCOUNTER — Telehealth: Payer: Self-pay | Admitting: *Deleted

## 2018-09-01 NOTE — Telephone Encounter (Signed)
Copied from [place] [id number]. Topic: Inquiry >> Sep 01, 2018  9:28 AM Scherrie Gerlach wrote: Reason for CRM: pt states she heard on the tv last night about a recall on the losartan (COZAAR) 100 MG tablet.  She called the pharmacy. They advised pt they thought it is ok, but to check with he dr.

## 2018-09-02 MED ORDER — CANDESARTAN CILEXETIL 32 MG PO TABS: 32.0000 mg | ORAL_TABLET | Freq: Every day | ORAL | 3 refills | Status: DC

## 2018-09-02 NOTE — Telephone Encounter (Signed)
Pls advise on msg below concerning losartan.Marland KitchenJohny Chess

## 2018-09-02 NOTE — Telephone Encounter (Signed)
Pt called and stated that no one has called her. Pt would like a call back. Please advise Cb#[phone]

## 2018-09-02 NOTE — Telephone Encounter (Signed)
Notified pt w/MDF response.Marland KitchenJohny Page

## 2018-09-02 NOTE — Telephone Encounter (Signed)
Losartan has just been recalled again  OK to try change to atacand 32 mg per day - done erx  Pt should have the pharmacy call us with the name of a similar alternative if this is not affordable

## 2018-12-23 ENCOUNTER — Ambulatory Visit (INDEPENDENT_AMBULATORY_CARE_PROVIDER_SITE_OTHER): Payer: Medicare Other | Admitting: Internal Medicine

## 2018-12-23 ENCOUNTER — Ambulatory Visit (INDEPENDENT_AMBULATORY_CARE_PROVIDER_SITE_OTHER)
Admission: RE | Admit: 2018-12-23 | Discharge: 2018-12-23 | Disposition: A | Discharge status: Home / Self Care | Payer: Medicare Other | Source: Ambulatory Visit | Attending: Internal Medicine | Admitting: Internal Medicine

## 2018-12-23 ENCOUNTER — Encounter: Payer: Self-pay | Admitting: Internal Medicine

## 2018-12-23 VITALS — BP 146/84 | HR 79 | Temp 98.9°F | Ht 60.0 in | Wt 126.0 lb

## 2018-12-23 DIAGNOSIS — M25552 Pain in left hip: Secondary | ICD-10-CM

## 2018-12-23 DIAGNOSIS — S79911A Unspecified injury of right hip, initial encounter: Secondary | ICD-10-CM | POA: Diagnosis not present

## 2018-12-23 DIAGNOSIS — M25551 Pain in right hip: Secondary | ICD-10-CM

## 2018-12-23 DIAGNOSIS — I10 Essential (primary) hypertension: Secondary | ICD-10-CM | POA: Diagnosis not present

## 2018-12-23 DIAGNOSIS — R7302 Impaired glucose tolerance (oral): Secondary | ICD-10-CM

## 2018-12-23 DIAGNOSIS — S79912A Unspecified injury of left hip, initial encounter: Secondary | ICD-10-CM | POA: Diagnosis not present

## 2018-12-23 DIAGNOSIS — Z23 Encounter for immunization: Secondary | ICD-10-CM | POA: Diagnosis not present

## 2018-12-23 MED ORDER — PREDNISONE 10 MG PO TABS: ORAL_TABLET | ORAL | 0 refills | Status: DC

## 2018-12-23 MED ORDER — TIZANIDINE HCL 2 MG PO TABS: 2.0000 mg | ORAL_TABLET | Freq: Four times a day (QID) | ORAL | 1 refills | Status: DC | PRN

## 2018-12-23 NOTE — Patient Instructions (Signed)
Please take all new medication as prescribed - the short course of low dose prednisone  Please take all new medication as prescribed - the muscle relaxer if needed  OK to take the Tylenol 500 mg every 8 hrs if needed  Please continue all other medications as before, and refills have been done if requested.  Please have the pharmacy call with any other refills you may need.  Please keep your appointments with your specialists as you may have planned  Please go to the XRAY Department in the Basement (go straight as you get off the elevator) for the x-ray testing  You will be contacted by phone if any changes need to be made immediately.  Otherwise, you will receive a letter about your results with an explanation, but please check with MyChart first.  Please remember to sign up for MyChart if you have not done so, as this will be important to you in the future with finding out test results, communicating by private email, and scheduling acute appointments online when needed.

## 2018-12-23 NOTE — Assessment & Plan Note (Signed)
stable overall by history and exam, recent data reviewed with pt, and pt to continue medical treatment as before,  to f/u any worsening symptoms or concerns  

## 2018-12-23 NOTE — Assessment & Plan Note (Signed)
Right > left c/w bursitis on right, and probable hamstring strain on left prox leg; for prednisone, muscle relaxer prn, tylenol ED every 8 prn, and hip/pelvis films r/o fx but seems less likely

## 2018-12-23 NOTE — Progress Notes (Signed)
Subjective:    Patient ID: Carrie Page, female    DOB: 04/10/1928, 85 y.o.   MRN: [id number]  HPI  Here after slip and fall on wet leaves to ground Dec 22 at the [facility], then several days later had onset right lateral hip pain that can radiate to the right lower lumbar area, also pain to the post left thigh at the leg juncture., better with joint and muscle cream.  Hurts to lie on right side, then better to turn.  Pt denies chest pain, increased sob or doe, wheezing, orthopnea, PND, increased LE swelling, palpitations, dizziness or syncope.  Pt denies new neurological symptoms such as new headache, or facial or extremity weakness or numbness   Pt denies polydipsia, polyuria, Past Medical History:  Diagnosis Date  . Allergic rhinitis, cause unspecified 01/28/2012  . Allergy   . Anemia   . Anxiety 01/28/2012  . Arthritis   . Cecal cancer ([place])   . Degenerative joint disease 01/28/2012  . Depression   . History of nonmelanoma skin cancer   . Hypertension   . Impaired glucose tolerance   . Osteoporosis    RT SHOULDER  . PONV (postoperative nausea and vomiting)    Past Surgical History:  Procedure Laterality Date  . ABDOMINAL HYSTERECTOMY    . APPENDECTOMY    . BACK SURGERY  2010   MICRODCISCECTOMY  . CHOLECYSTECTOMY    . LAPAROSCOPIC PARTIAL COLECTOMY N/A 06/20/2014   Procedure: LAPAROSCOPIC ASSISTED RIGHT PARTIAL COLECTOMY;  Surgeon: Leighton Ruff, MD;  Location: [facility];  Service: General;  Laterality: N/A;  . TONSILLECTOMY      reports that she has never smoked. She has never used smokeless tobacco. She reports that she does not drink alcohol or use drugs. family history includes Arthritis in an other family member; Colon cancer in her father; Heart disease in an other family member; Stroke in an other family member. Allergies  Allergen Reactions  . Bactrim [Sulfamethoxazole-Trimethoprim] Itching, Rash and Other (See Comments)    Rash, itching, diaphoretic  . Lovastatin  Other (See Comments)    Cognitive slowing and myalgias  . Nitrofurantoin Other (See Comments)    GI upset  . Penicillins Itching  . Pravastatin Other (See Comments)    Myalgias, increased dreams  . Naproxen Rash   Current Outpatient Medications on File Prior to Visit  Medication Sig Dispense Refill  . aspirin EC 81 MG tablet Take 81 mg by mouth daily.    . candesartan (ATACAND) 32 MG tablet Take 1 tablet (32 mg total) by mouth daily. 90 tablet 3  . Cholecalciferol (VITAMIN D3) 1000 UNITS CAPS Take 1 capsule by mouth daily.    . Cyanocobalamin (VITAMIN B 12 PO) Take 1 tablet by mouth daily.    . diazepam (VALIUM) 5 MG tablet TAKE ONE TABLET BY MOUTH ONCE DAILY AS NEEDED FOR ANXIETY 30 tablet 2   No current facility-administered medications on file prior to visit.    Review of Systems  Constitutional: Negative for other unusual diaphoresis or sweats HENT: Negative for ear discharge or swelling Eyes: Negative for other worsening visual disturbances Respiratory: Negative for stridor or other swelling  Gastrointestinal: Negative for worsening distension or other blood Genitourinary: Negative for retention or other urinary change Musculoskeletal: Negative for other MSK pain or swelling Skin: Negative for color change or other new lesions Neurological: Negative for worsening tremors and other numbness  Psychiatric/Behavioral: Negative for worsening agitation or other fatigue All other system neg per pt  Objective:   Physical Exam BP (!) 146/84   Pulse 79   Temp 98.9 F (37.2 C) (Oral)   Ht 5' (1.524 m)   Wt 126 lb (57.2 kg)   SpO2 98%   BMI 24.61 kg/m  VS noted,  Constitutional: Pt appears in NAD HENT: Head: NCAT.  Right Ear: External ear normal.  Left Ear: External ear normal.  Eyes: . Pupils are equal, round, and reactive to light. Conjunctivae and EOM are normal Nose: without d/c or deformity Neck: Neck supple. Gross normal ROM Cardiovascular: Normal rate and regular  rhythm.   Pulmonary/Chest: Effort normal and breath sounds without rales or wheezing.  Abd:  Soft, NT, ND, + BS, no organomegaly + mild tender over right greater trochanter, also tender lower left buttock area without rash or swelling Neurological: Pt is alert. At baseline orientation, motor grossly intact Skin: Skin is warm. No rashes, other new lesions, no LE edema Psychiatric: Pt behavior is normal without agitation  No other exam findings  Lab Results  Component Value Date   WBC 5.6 03/26/2018   HGB 12.9 03/26/2018   HCT 38.4 03/26/2018   PLT 201.0 03/26/2018   GLUCOSE 107 (H) 03/26/2018   CHOL 201 (H) 03/26/2018   TRIG 141.0 03/26/2018   HDL 63.00 03/26/2018   LDLDIRECT 124.0 03/04/2013   LDLCALC 109 (H) 03/26/2018   ALT 12 03/26/2018   AST 18 03/26/2018   NA 137 03/26/2018   K 4.2 03/26/2018   CL 103 03/26/2018   CREATININE 0.62 03/26/2018   BUN 23 03/26/2018   CO2 29 03/26/2018   TSH 1.41 03/26/2018   HGBA1C 6.2 03/19/2016       Assessment & Plan:

## 2019-01-04 ENCOUNTER — Inpatient Hospital Stay: Payer: Medicare Other | Admitting: Oncology

## 2019-01-04 ENCOUNTER — Inpatient Hospital Stay: Payer: Medicare Other | Attending: Oncology

## 2019-01-04 VITALS — BP 196/68 | HR 77 | Temp 97.5°F | Resp 18 | Ht 60.0 in | Wt 125.6 lb

## 2019-01-04 DIAGNOSIS — C189 Malignant neoplasm of colon, unspecified: Secondary | ICD-10-CM

## 2019-01-04 DIAGNOSIS — D509 Iron deficiency anemia, unspecified: Secondary | ICD-10-CM

## 2019-01-04 DIAGNOSIS — L271 Localized skin eruption due to drugs and medicaments taken internally: Secondary | ICD-10-CM

## 2019-01-04 LAB — CEA (IN HOUSE-CHCC): CEA (CHCC-IN HOUSE): 1.22 ng/mL (ref 0.00–5.00)

## 2019-01-04 NOTE — Progress Notes (Signed)
  [facility] OFFICE PROGRESS NOTE   Diagnosis: Colon cancer  INTERVAL HISTORY:   Carrie Page returns as scheduled.  She feels well.  No difficulty with bowel function.  Good appetite.  She is completing a prednisone taper for management of right hip discomfort.  The pain has improved.  She reports her pressure is often elevated when she is seen here, but not at home.  Objective:  Vital signs in last 24 hours:  Blood pressure (!) 196/68, pulse 77, temperature (!) 97.5 F (36.4 C), temperature source Oral, resp. rate 18, height 5' (1.524 m), weight 125 lb 9.6 oz (57 kg), SpO2 100 %.    HEENT: Neck without mass Lymphatics: No cervical, supraclavicular, axillary, or inguinal nodes Resp: Lungs clear bilaterally Cardio: Regular rate and rhythm GI: No hepatosplenomegaly, no mass, nontender Vascular: No leg edema  Lab Results:   Lab Results  Component Value Date   CEA1 1.06 07/06/2018     Medications: I have reviewed the patient's current medications.   Assessment/Plan: 1. 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   Cycle 2 adjuvant Xeloda 08/08/2014.   Cycle 3 adjuvant Xeloda 08/29/2014.  Cycle 4 adjuvant Xeloda 09/19/2014  Cycle 5 adjuvant Xeloda 10/10/2014  Cycle 6 adjuvant Xeloda 10/31/2014  Cycle 7 adjuvant Xeloda 11/21/2014  Cycle 8 adjuvant Xeloda 12/13/2014  Surveillance CT scans 05/26/2015 with no evidence of metastatic disease.  Colonoscopy 08/01/2015. Sessile polyp was found in the rectum; polypectomy was performed using snare cautery--tubular adenoma. Examination otherwise normal. Next colonoscopy at a 3 year interval.  Surveillance CT scan 05/10/2016-no  evidence of metastatic disease  Surveillance CT scan 07/07/2017-no evidence of metastatic disease 2. History of iron deficiency anemia 3. Early hand-foot syndrome with mild erythema/tenderness of the palms and soles 10/03/2014     Disposition: Carrie Page diagnosed with stage III colon cancer in July 2015.  She remains in clinical remission.  She plans to continue clinical follow-up with Dr. Jenny Reichmann.  She underwent a colonoscopy in June 2016 when a tubular adenoma was removed from the rectum.  I will contact gastroenterology to see whether a surveillance colonoscopy is recommended.  Carrie Page is not scheduled for a follow-up appointment at the Cancer center.  I am available to see her in the future as needed.  Betsy Coder, MD  01/04/2019  12:29 PM

## 2019-01-07 ENCOUNTER — Telehealth: Payer: Self-pay | Admitting: *Deleted

## 2019-01-07 NOTE — Telephone Encounter (Signed)
Patient notified of CEA and no further need for colonoscopy per GI.

## 2019-01-07 NOTE — Telephone Encounter (Signed)
-----   Message from Ladell Pier, MD sent at 01/04/2019  6:37 PM EST ----- Please call patient, the CEA is normal, I contacted gastroenterology and they are in agreement with no further colonoscopy

## 2019-02-02 DIAGNOSIS — Z85038 Personal history of other malignant neoplasm of large intestine: Secondary | ICD-10-CM | POA: Diagnosis not present

## 2019-02-02 DIAGNOSIS — Z0001 Encounter for general adult medical examination with abnormal findings: Secondary | ICD-10-CM | POA: Diagnosis not present

## 2019-02-10 ENCOUNTER — Encounter: Payer: Self-pay | Admitting: Internal Medicine

## 2019-02-10 ENCOUNTER — Ambulatory Visit (INDEPENDENT_AMBULATORY_CARE_PROVIDER_SITE_OTHER): Payer: Medicare Other | Admitting: Internal Medicine

## 2019-02-10 VITALS — BP 104/62 | HR 71 | Temp 98.4°F | Ht 60.0 in | Wt 126.0 lb

## 2019-02-10 DIAGNOSIS — M545 Low back pain, unspecified: Secondary | ICD-10-CM | POA: Insufficient documentation

## 2019-02-10 DIAGNOSIS — S0083XA Contusion of other part of head, initial encounter: Secondary | ICD-10-CM | POA: Insufficient documentation

## 2019-02-10 DIAGNOSIS — I10 Essential (primary) hypertension: Secondary | ICD-10-CM

## 2019-02-10 DIAGNOSIS — T07XXXA Unspecified multiple injuries, initial encounter: Secondary | ICD-10-CM | POA: Insufficient documentation

## 2019-02-10 DIAGNOSIS — S300XXA Contusion of lower back and pelvis, initial encounter: Secondary | ICD-10-CM

## 2019-02-10 NOTE — Assessment & Plan Note (Signed)
Facial bruising near resolved, does not appear to need CT head, ok  to f/u any worsening symptoms or concerns

## 2019-02-10 NOTE — Progress Notes (Signed)
Subjective:    Patient ID: Carrie Page, female    DOB: [birth year], 85 y.o.   MRN: [id number]  HPI Here to f/u after a fall 3 wks ago, and family finally convinced her to be seen. She got up from sleeping in a chair, bumped a door jam on first getting up, tried to turn but lost balance and then fell with striking the left face and left periorbital orbital with severe swelling and pain x 3 wks, now much improved but still has bruising to left periorbital and swelling persitent, as well as ? Hematoma to the left buttocks and bruising involving both buttocks b/c somehow she hit the buttocks as well in the fall.  Pt also continues to have right knife like LBP mild to mod intermittent x 3 wks without bowel or bladder change, fever, wt loss,  worsening LE pain/numbness/weakness, gait change or falls, and is convinced she has "sciatica" though only describes the worst pain as located about the right SI joint area of the lower back. Past Medical History:  Diagnosis Date  . Allergic rhinitis, cause unspecified 01/28/2012  . Allergy   . Anemia   . Anxiety 01/28/2012  . Arthritis   . Cecal cancer ([place])   . Degenerative joint disease 01/28/2012  . Depression   . History of nonmelanoma skin cancer   . Hypertension   . Impaired glucose tolerance   . Osteoporosis    RT SHOULDER  . PONV (postoperative nausea and vomiting)    Past Surgical History:  Procedure Laterality Date  . ABDOMINAL HYSTERECTOMY    . APPENDECTOMY    . BACK SURGERY  2010   MICRODCISCECTOMY  . CHOLECYSTECTOMY    . LAPAROSCOPIC PARTIAL COLECTOMY N/A 06/20/2014   Procedure: LAPAROSCOPIC ASSISTED RIGHT PARTIAL COLECTOMY;  Surgeon: Leighton Ruff, MD;  Location: [facility];  Service: General;  Laterality: N/A;  . TONSILLECTOMY      reports that she has never smoked. She has never used smokeless tobacco. She reports that she does not drink alcohol or use drugs. family history includes Arthritis in an other family member; Colon cancer in  her father; Heart disease in an other family member; Stroke in an other family member. Allergies  Allergen Reactions  . Bactrim [Sulfamethoxazole-Trimethoprim] Itching, Rash and Other (See Comments)    Rash, itching, diaphoretic  . Lovastatin Other (See Comments)    Cognitive slowing and myalgias  . Nitrofurantoin Other (See Comments)    GI upset  . Penicillins Itching  . Pravastatin Other (See Comments)    Myalgias, increased dreams  . Naproxen Rash   Current Outpatient Medications on File Prior to Visit  Medication Sig Dispense Refill  . aspirin EC 81 MG tablet Take 81 mg by mouth daily.    . candesartan (ATACAND) 32 MG tablet Take 1 tablet (32 mg total) by mouth daily. 90 tablet 3  . Cholecalciferol (VITAMIN D3) 1000 UNITS CAPS Take 1 capsule by mouth daily.    . Cyanocobalamin (VITAMIN B 12 PO) Take 1 tablet by mouth daily.    . diazepam (VALIUM) 5 MG tablet TAKE ONE TABLET BY MOUTH ONCE DAILY AS NEEDED FOR ANXIETY 30 tablet 2  . MULTIPLE VITAMIN PO Take 1 tablet by mouth daily.    . predniSONE (DELTASONE) 10 MG tablet 2 tabs by mouth per day for 5 days 18 tablet 0  . tiZANidine (ZANAFLEX) 2 MG tablet Take 1 tablet (2 mg total) by mouth every 6 (six) hours as needed for muscle  spasms. 30 tablet 1   No current facility-administered medications on file prior to visit.    Review of Systems  Constitutional: Negative for other unusual diaphoresis or sweats HENT: Negative for ear discharge or swelling Eyes: Negative for other worsening visual disturbances Respiratory: Negative for stridor or other swelling  Gastrointestinal: Negative for worsening distension or other blood Genitourinary: Negative for retention or other urinary change Musculoskeletal: Negative for other MSK pain or swelling Skin: Negative for color change or other new lesions Neurological: Negative for worsening tremors and other numbness  Psychiatric/Behavioral: Negative for worsening agitation or other  fatigue All other system neg per pt    Objective:   Physical Exam BP 104/62   Pulse 71   Temp 98.4 F (36.9 C) (Oral)   Ht 5' (1.524 m)   Wt 126 lb (57.2 kg)   SpO2 97%   BMI 24.61 kg/m  VS noted, non toxic Constitutional: Pt appears in NAD HENT: Head: NCAT.  Right Ear: External ear normal.  Left Ear: External ear normal.  Eyes: . Pupils are equal, round, and reactive to light. Conjunctivae and EOM are normal Left periorbital area with slight dependent bruising and trace swelling but o/w non tender Nose: without d/c or deformity Neck: Neck supple. Gross normal ROM Cardiovascular: Normal rate and regular rhythm.   Pulmonary/Chest: Effort normal and breath sounds without rales or wheezing.  Abd:  Soft, NT, ND, + BS, no organomegaly Neurological: Pt is alert. At baseline orientation, motor grossly intact Skin: Skin is warm. no LE edema, has very large area purple brown bruising to right and left buttocks and natal cleft area, also vague swelling and tender noted mid left buttock Psychiatric: Pt behavior is normal without agitation  No other exam findings Lab Results  Component Value Date   WBC 5.6 03/26/2018   HGB 12.9 03/26/2018   HCT 38.4 03/26/2018   PLT 201.0 03/26/2018   GLUCOSE 107 (H) 03/26/2018   CHOL 201 (H) 03/26/2018   TRIG 141.0 03/26/2018   HDL 63.00 03/26/2018   LDLDIRECT 124.0 03/04/2013   LDLCALC 109 (H) 03/26/2018   ALT 12 03/26/2018   AST 18 03/26/2018   NA 137 03/26/2018   K 4.2 03/26/2018   CL 103 03/26/2018   CREATININE 0.62 03/26/2018   BUN 23 03/26/2018   CO2 29 03/26/2018   TSH 1.41 03/26/2018   HGBA1C 6.2 03/19/2016       Assessment & Plan:

## 2019-02-10 NOTE — Assessment & Plan Note (Signed)
Mild to mod it seems, should be seen as well by the MRI LS spine if present, for cbc as well

## 2019-02-10 NOTE — Patient Instructions (Addendum)
Please continue all other medications as before, and refills have been done if requested.  Please have the pharmacy call with any other refills you may need.  Please continue your efforts at being more active, low cholesterol diet, and weight control.  Please keep your appointments with your specialists as you may have planned  You will be contacted regarding the referral for: MRI (see [facility] today)  Please go to the LAB in the Basement (turn left off the elevator) for the tests to be done today  You will be contacted by phone if any changes need to be made immediately.  Otherwise, you will receive a letter about your results with an explanation, but please check with MyChart first.  Please remember to sign up for MyChart if you have not done so, as this will be important to you in the future with finding out test results, communicating by private email, and scheduling acute appointments online when needed.

## 2019-02-10 NOTE — Assessment & Plan Note (Signed)
?   Facet syndrome vs other such as pain related to bruising/hematoma, for MRI LS spine r/o fx

## 2019-02-10 NOTE — Assessment & Plan Note (Signed)
stable overall by history and exam, recent data reviewed with pt, and pt to continue medical treatment as before,  to f/u any worsening symptoms or concerns  

## 2019-02-11 ENCOUNTER — Other Ambulatory Visit (INDEPENDENT_AMBULATORY_CARE_PROVIDER_SITE_OTHER): Payer: Medicare Other

## 2019-02-11 DIAGNOSIS — S300XXA Contusion of lower back and pelvis, initial encounter: Secondary | ICD-10-CM

## 2019-02-11 DIAGNOSIS — M545 Low back pain, unspecified: Secondary | ICD-10-CM

## 2019-02-11 DIAGNOSIS — Z0000 Encounter for general adult medical examination without abnormal findings: Secondary | ICD-10-CM | POA: Diagnosis not present

## 2019-02-11 LAB — CBC WITH DIFFERENTIAL/PLATELET
BASOS ABS: 0.1 10*3/uL (ref 0.0–0.1)
Basophils Relative: 1.1 % (ref 0.0–3.0)
EOS ABS: 0.1 10*3/uL (ref 0.0–0.7)
Eosinophils Relative: 2.5 % (ref 0.0–5.0)
HCT: 38.8 % (ref 36.0–46.0)
Hemoglobin: 13 g/dL (ref 12.0–15.0)
LYMPHS ABS: 1.9 10*3/uL (ref 0.7–4.0)
Lymphocytes Relative: 33.2 % (ref 12.0–46.0)
MCHC: 33.3 g/dL (ref 30.0–36.0)
MCV: 88.8 fl (ref 78.0–100.0)
MONO ABS: 0.4 10*3/uL (ref 0.1–1.0)
Monocytes Relative: 7.5 % (ref 3.0–12.0)
NEUTROS ABS: 3.3 10*3/uL (ref 1.4–7.7)
NEUTROS PCT: 55.7 % (ref 43.0–77.0)
PLATELETS: 235 10*3/uL (ref 150.0–400.0)
RBC: 4.38 Mil/uL (ref 3.87–5.11)
RDW: 13.6 % (ref 11.5–15.5)
WBC: 5.9 10*3/uL (ref 4.0–10.5)

## 2019-02-11 LAB — URINALYSIS, ROUTINE W REFLEX MICROSCOPIC
Bilirubin Urine: NEGATIVE
Ketones, ur: NEGATIVE
Nitrite: NEGATIVE
Specific Gravity, Urine: 1.02 (ref 1.000–1.030)
Total Protein, Urine: NEGATIVE
Urine Glucose: NEGATIVE
Urobilinogen, UA: 0.2 (ref 0.0–1.0)
pH: 6 (ref 5.0–8.0)

## 2019-02-11 LAB — LIPID PANEL
Cholesterol: 225 mg/dL — ABNORMAL HIGH (ref 0–200)
HDL: 75.5 mg/dL (ref >39.00)
LDL Cholesterol: 122 mg/dL — ABNORMAL HIGH (ref 0–99)
NonHDL: 149.37
Total CHOL/HDL Ratio: 3
Triglycerides: 135 mg/dL (ref 0.0–149.0)
VLDL: 27 mg/dL (ref 0.0–40.0)

## 2019-02-11 LAB — BASIC METABOLIC PANEL
BUN: 23 mg/dL (ref 6–23)
CHLORIDE: 101 meq/L (ref 96–112)
CO2: 32 mEq/L (ref 19–32)
Calcium: 9.6 mg/dL (ref 8.4–10.5)
Creatinine, Ser: 0.71 mg/dL (ref 0.40–1.20)
GFR: 77.63 mL/min (ref >60.00)
Glucose, Bld: 115 mg/dL — ABNORMAL HIGH (ref 70–99)
POTASSIUM: 4 meq/L (ref 3.5–5.1)
SODIUM: 138 meq/L (ref 135–145)

## 2019-02-11 LAB — HEPATIC FUNCTION PANEL
ALT: 14 U/L (ref 0–35)
AST: 20 U/L (ref 0–37)
Albumin: 4.3 g/dL (ref 3.5–5.2)
Alkaline Phosphatase: 95 U/L (ref 39–117)
Bilirubin, Direct: 0.1 mg/dL (ref 0.0–0.3)
TOTAL PROTEIN: 7.3 g/dL (ref 6.0–8.3)
Total Bilirubin: 0.6 mg/dL (ref 0.2–1.2)

## 2019-02-11 LAB — TSH: TSH: 1.56 u[IU]/mL (ref 0.35–4.50)

## 2019-02-12 ENCOUNTER — Other Ambulatory Visit (INDEPENDENT_AMBULATORY_CARE_PROVIDER_SITE_OTHER): Payer: Medicare Other

## 2019-02-12 ENCOUNTER — Encounter: Payer: Self-pay | Admitting: Internal Medicine

## 2019-02-12 DIAGNOSIS — R739 Hyperglycemia, unspecified: Secondary | ICD-10-CM

## 2019-02-12 LAB — HEMOGLOBIN A1C: HEMOGLOBIN A1C: 5.8 % (ref 4.6–6.5)

## 2019-02-13 ENCOUNTER — Ambulatory Visit
Admission: RE | Admit: 2019-02-13 | Discharge: 2019-02-13 | Disposition: A | Discharge status: Home / Self Care | Payer: Medicare Other | Source: Ambulatory Visit | Attending: Internal Medicine | Admitting: Internal Medicine

## 2019-02-13 DIAGNOSIS — M545 Low back pain, unspecified: Secondary | ICD-10-CM

## 2019-02-13 DIAGNOSIS — M48061 Spinal stenosis, lumbar region without neurogenic claudication: Secondary | ICD-10-CM | POA: Diagnosis not present

## 2019-02-13 DIAGNOSIS — T07XXXA Unspecified multiple injuries, initial encounter: Secondary | ICD-10-CM

## 2019-02-15 ENCOUNTER — Encounter: Payer: Self-pay | Admitting: Internal Medicine

## 2019-02-18 ENCOUNTER — Telehealth: Payer: Self-pay | Admitting: Internal Medicine

## 2019-02-18 NOTE — Telephone Encounter (Signed)
Copied from [place] [id number]. Topic: General - Inquiry >> Feb 18, 2019  4:14 PM Alanda Slim E wrote: Reason for CRM: Pt called about MRI results/ advise PT of result letter mailed. Pt would like Dr Jenny Reichmann or Nurse to call and speak about results/ please advise

## 2019-02-22 ENCOUNTER — Other Ambulatory Visit: Payer: Self-pay

## 2019-02-22 ENCOUNTER — Encounter: Payer: Self-pay | Admitting: Internal Medicine

## 2019-02-22 ENCOUNTER — Ambulatory Visit (INDEPENDENT_AMBULATORY_CARE_PROVIDER_SITE_OTHER): Payer: Medicare Other | Admitting: Internal Medicine

## 2019-02-22 VITALS — BP 156/84 | HR 81 | Ht 60.0 in | Wt 125.0 lb

## 2019-02-22 DIAGNOSIS — M545 Low back pain, unspecified: Secondary | ICD-10-CM

## 2019-02-22 DIAGNOSIS — S300XXA Contusion of lower back and pelvis, initial encounter: Secondary | ICD-10-CM

## 2019-02-22 DIAGNOSIS — M4800 Spinal stenosis, site unspecified: Secondary | ICD-10-CM | POA: Diagnosis not present

## 2019-02-22 DIAGNOSIS — I10 Essential (primary) hypertension: Secondary | ICD-10-CM

## 2019-02-22 MED ORDER — TRAMADOL HCL 50 MG PO TABS: 50.0000 mg | ORAL_TABLET | Freq: Four times a day (QID) | ORAL | 0 refills | Status: DC | PRN

## 2019-02-22 NOTE — Telephone Encounter (Signed)
Pt stated that she has an appt today to come in to discuss her MRI results.

## 2019-02-22 NOTE — Assessment & Plan Note (Signed)
No neuritic symptoms or radiculopathy, has persistent pain not improved with prednisone or muscle relaxer, for tramadol prn, refer pain management/Dr Ramos and outpt PT

## 2019-02-22 NOTE — Assessment & Plan Note (Signed)
stable overall by history and exam, recent data reviewed with pt, and pt to continue medical treatment as before,  to f/u any worsening symptoms or concerns  

## 2019-02-22 NOTE — Patient Instructions (Signed)
Please take all new medication as prescribed - the pain medication  You will be contacted regarding the referral for: Physical Therapy, and Dr Nelva Bush - orthopedic  Please continue all other medications as before, and refills have been done if requested.  Please have the pharmacy call with any other refills you may need.  Please keep your appointments with your specialists as you may have planned

## 2019-02-22 NOTE — Assessment & Plan Note (Signed)
Improved,  to f/u any worsening symptoms or concerns 

## 2019-02-22 NOTE — Progress Notes (Signed)
Subjective:    Patient ID: Carrie Page, female    DOB: [birth year], 85 y.o.   MRN: [id number]  HPI  Here with persistent maybe worsening pain to right lower back, knife like, seems to wrap around the side,moderate intermittent   Pt denies bowel or bladder change, fever, wt loss,  worsening LE pain/numbness/weakness, gait change or falls.  Pt denies chest pain, increased sob or doe, wheezing, orthopnea, PND, increased LE swelling, palpitations, dizziness or syncope.  Pt denies new neurological symptoms such as new headache, or facial or extremity weakness or numbness   Pt denies polydipsia, polyuria Past Medical History:  Diagnosis Date  . Allergic rhinitis, cause unspecified 01/28/2012  . Allergy   . Anemia   . Anxiety 01/28/2012  . Arthritis   . Cecal cancer ([place])   . Degenerative joint disease 01/28/2012  . Depression   . History of nonmelanoma skin cancer   . Hypertension   . Impaired glucose tolerance   . Osteoporosis    RT SHOULDER  . PONV (postoperative nausea and vomiting)    Past Surgical History:  Procedure Laterality Date  . ABDOMINAL HYSTERECTOMY    . APPENDECTOMY    . BACK SURGERY  2010   MICRODCISCECTOMY  . CHOLECYSTECTOMY    . LAPAROSCOPIC PARTIAL COLECTOMY N/A 06/20/2014   Procedure: LAPAROSCOPIC ASSISTED RIGHT PARTIAL COLECTOMY;  Surgeon: Leighton Ruff, MD;  Location: [facility];  Service: General;  Laterality: N/A;  . TONSILLECTOMY      reports that she has never smoked. She has never used smokeless tobacco. She reports that she does not drink alcohol or use drugs. family history includes Arthritis in an other family member; Colon cancer in her father; Heart disease in an other family member; Stroke in an other family member. Allergies  Allergen Reactions  . Bactrim [Sulfamethoxazole-Trimethoprim] Itching, Rash and Other (See Comments)    Rash, itching, diaphoretic  . Lovastatin Other (See Comments)    Cognitive slowing and myalgias  . Nitrofurantoin Other (See  Comments)    GI upset  . Penicillins Itching  . Pravastatin Other (See Comments)    Myalgias, increased dreams  . Naproxen Rash   Current Outpatient Medications on File Prior to Visit  Medication Sig Dispense Refill  . aspirin EC 81 MG tablet Take 81 mg by mouth daily.    . candesartan (ATACAND) 32 MG tablet Take 1 tablet (32 mg total) by mouth daily. 90 tablet 3  . Cholecalciferol (VITAMIN D3) 1000 UNITS CAPS Take 1 capsule by mouth daily.    . Cyanocobalamin (VITAMIN B 12 PO) Take 1 tablet by mouth daily.    . diazepam (VALIUM) 5 MG tablet TAKE ONE TABLET BY MOUTH ONCE DAILY AS NEEDED FOR ANXIETY 30 tablet 2  . MULTIPLE VITAMIN PO Take 1 tablet by mouth daily.    Marland Kitchen tiZANidine (ZANAFLEX) 2 MG tablet Take 1 tablet (2 mg total) by mouth every 6 (six) hours as needed for muscle spasms. 30 tablet 1   No current facility-administered medications on file prior to visit.    Review of Systems  Constitutional: Negative for other unusual diaphoresis or sweats HENT: Negative for ear discharge or swelling Eyes: Negative for other worsening visual disturbances Respiratory: Negative for stridor or other swelling  Gastrointestinal: Negative for worsening distension or other blood Genitourinary: Negative for retention or other urinary change Musculoskeletal: Negative for other MSK pain or swelling Skin: Negative for color change or other new lesions Neurological: Negative for worsening tremors and other  numbness  Psychiatric/Behavioral: Negative for worsening agitation or other fatigue All other system neg per pt    Objective:   Physical Exam BP (!) 156/84 (BP Location: Left Arm, Patient Position: Sitting, Cuff Size: Normal)   Pulse 81   Ht 5' (1.524 m)   Wt 125 lb (56.7 kg)   SpO2 99%   BMI 24.41 kg/m  VS noted, not ill appearing but uncomfortable Constitutional: Pt appears in NAD HENT: Head: NCAT.  Right Ear: External ear normal.  Left Ear: External ear normal.  Eyes: . Pupils are  equal, round, and reactive to light. Conjunctivae and EOM are normal Nose: without d/c or deformity Neck: Neck supple. Gross normal ROM Cardiovascular: Normal rate and regular rhythm.   Pulmonary/Chest: Effort normal and breath sounds without rales or wheezing.  Abd:  Soft, NT, ND, + BS, no organomegaly Spine nontender in midline, has right lumbar paravertebral mild tender Neurological: Pt is alert. At baseline orientation, motor grossly intact Skin: Skin is warm. No rashes, other new lesions, no LE edema Psychiatric: Pt behavior is normal without agitation  No other exam findings Lab Results  Component Value Date   WBC 5.9 02/11/2019   HGB 13.0 02/11/2019   HCT 38.8 02/11/2019   PLT 235.0 02/11/2019   GLUCOSE 115 (H) 02/11/2019   CHOL 225 (H) 02/11/2019   TRIG 135.0 02/11/2019   HDL 75.50 02/11/2019   LDLDIRECT 124.0 03/04/2013   LDLCALC 122 (H) 02/11/2019   ALT 14 02/11/2019   AST 20 02/11/2019   NA 138 02/11/2019   K 4.0 02/11/2019   CL 101 02/11/2019   CREATININE 0.71 02/11/2019   BUN 23 02/11/2019   CO2 32 02/11/2019   TSH 1.56 02/11/2019   HGBA1C 5.8 02/12/2019   MR Lumbar Spine Wo Contrast (Accession [id number]) (Order [id number])  Imaging  Date: 02/13/2019 Department: [facility] [place] Released By: Sherril Cong Authorizing: Biagio Borg, MD  Exam Information   Status Exam Begun  Exam Ended   Final [99] 02/13/2019 12:46 PM 02/13/2019 1:17 PM  PACS Images   Show images for MR Lumbar Spine Wo Contrast  Study Result   CLINICAL DATA:  Acute right-sided low back pain without sciatica. Fall with bruising 3 weeks ago  EXAM: MRI LUMBAR SPINE WITHOUT CONTRAST  TECHNIQUE: Multiplanar, multisequence MR imaging of the lumbar spine was performed. No intravenous contrast was administered.  COMPARISON:  11/13/2009  FINDINGS: Segmentation:  Standard lumbar numbering  Alignment:  Levoscoliosis.  Mild grade 1 anterolisthesis at  L3-4  Vertebrae:  No fracture, evidence of discitis, or bone lesion.  Conus medullaris and cauda equina: Conus extends to the L1-2 level. Conus and cauda equina appear normal.  Paraspinal and other soft tissues: Up to 4 cm cystic appearing structure in the left gluteal subcutaneous fat with T1 hyperintensity and hypointense rim, most consistent with subacute hematoma.  Disc levels:  T12- L1: Spondylosis.  No impingement  L1-L2:  spondylosis. No impingement  L2-L3: Spondylosis. No impingement  L3-L4: Facet degeneration with spurring and anterolisthesis. Disc narrowing and bulging with asymmetric right-sided height loss. Moderate borderline advanced spinal stenosis. Noncompressive right foraminal narrowing inferiorly  L4-L5: Advanced asymmetric left-sided disc collapse and far-lateral spurring. Degenerative facet spurring asymmetric to the left. Left subarticular recess stenosis but the descending L5 nerve root is medial and not compressed. Mild-to-moderate spinal stenosis. Noncompressive left foraminal narrowing  L5-S1: Disc narrowing with asymmetric left-sided height loss and bulge. Left foraminal protrusion compressing the L5 nerve root.  Moderate degenerative facet spurring.  IMPRESSION: 1. 4 cm subcutaneous subacute hematoma in the left gluteal region. No compression fracture. 2. Advanced degenerative disease with scoliosis. 3. L3-4 moderate to advanced spinal stenosis. 4. L4-5 mild to moderate spinal stenosis. 5. L5-S1 severe left foraminal impingement.   Electronically Signed   By: Monte Fantasia M.D.   On: 02/14/2019 09:16       Assessment & Plan:

## 2019-03-23 DIAGNOSIS — M5136 Other intervertebral disc degeneration, lumbar region: Secondary | ICD-10-CM | POA: Diagnosis not present

## 2019-04-01 ENCOUNTER — Encounter: Payer: Medicare Other | Admitting: Internal Medicine

## 2019-05-20 ENCOUNTER — Ambulatory Visit (INDEPENDENT_AMBULATORY_CARE_PROVIDER_SITE_OTHER): Payer: Medicare Other | Admitting: Internal Medicine

## 2019-05-20 ENCOUNTER — Encounter: Payer: Self-pay | Admitting: Internal Medicine

## 2019-05-20 ENCOUNTER — Other Ambulatory Visit: Payer: Self-pay

## 2019-05-20 VITALS — BP 122/70 | HR 87 | Temp 98.9°F | Ht 60.0 in | Wt 122.0 lb

## 2019-05-20 DIAGNOSIS — Z0000 Encounter for general adult medical examination without abnormal findings: Secondary | ICD-10-CM

## 2019-05-20 DIAGNOSIS — R7302 Impaired glucose tolerance (oral): Secondary | ICD-10-CM

## 2019-05-20 NOTE — Progress Notes (Signed)
Subjective:    Patient ID: Carrie Page, female    DOB: 1928-01-04, 85 y.o.   MRN: [id number]  HPI  Here for wellness and f/u;  Overall doing ok;  Pt denies Chest pain, worsening SOB, DOE, wheezing, orthopnea, PND, worsening LE edema, palpitations, dizziness or syncope.  Pt denies neurological change such as new headache, facial or extremity weakness.  Pt denies polydipsia, polyuria, or low sugar symptoms. Pt states overall good compliance with treatment and medications, good tolerability, and has been trying to follow appropriate diet.  Pt denies worsening depressive symptoms, suicidal ideation or panic. No fever, night sweats, wt loss, loss of appetite, or other constitutional symptoms.  Pt states good ability with ADL's, has low fall risk, home safety reviewed and adequate, no other significant changes in hearing or vision, and only occasionally active with exercise.  Hip pain now much improved after April 2020 fall.  Plans to [place] for [place] exam after pandemic improved.  Past Medical History:  Diagnosis Date  . Allergic rhinitis, cause unspecified 01/28/2012  . Allergy   . Anemia   . Anxiety 01/28/2012  . Arthritis   . Cecal cancer ([place])   . Degenerative joint disease 01/28/2012  . Depression   . History of nonmelanoma skin cancer   . Hypertension   . Impaired glucose tolerance   . Osteoporosis    RT SHOULDER  . PONV (postoperative nausea and vomiting)    Past Surgical History:  Procedure Laterality Date  . ABDOMINAL HYSTERECTOMY    . APPENDECTOMY    . BACK SURGERY  2010   MICRODCISCECTOMY  . CHOLECYSTECTOMY    . LAPAROSCOPIC PARTIAL COLECTOMY N/A 06/20/2014   Procedure: LAPAROSCOPIC ASSISTED RIGHT PARTIAL COLECTOMY;  Surgeon: Leighton Ruff, MD;  Location: [facility];  Service: General;  Laterality: N/A;  . TONSILLECTOMY      reports that she has never smoked. She has never used smokeless tobacco. She reports that she does not drink alcohol or use drugs. family history includes  Arthritis in an other family member; Colon cancer in her father; Heart disease in an other family member; Stroke in an other family member. Allergies  Allergen Reactions  . Bactrim [Sulfamethoxazole-Trimethoprim] Itching, Rash and Other (See Comments)    Rash, itching, diaphoretic  . Lovastatin Other (See Comments)    Cognitive slowing and myalgias  . Nitrofurantoin Other (See Comments)    GI upset  . Penicillins Itching  . Pravastatin Other (See Comments)    Myalgias, increased dreams  . Naproxen Rash   Current Outpatient Medications on File Prior to Visit  Medication Sig Dispense Refill  . aspirin EC 81 MG tablet Take 81 mg by mouth daily.    . candesartan (ATACAND) 32 MG tablet Take 1 tablet (32 mg total) by mouth daily. 90 tablet 3  . Cholecalciferol (VITAMIN D3) 1000 UNITS CAPS Take 1 capsule by mouth daily.    . Cyanocobalamin (VITAMIN B 12 PO) Take 1 tablet by mouth daily.    . diazepam (VALIUM) 5 MG tablet TAKE ONE TABLET BY MOUTH ONCE DAILY AS NEEDED FOR ANXIETY 30 tablet 2  . MULTIPLE VITAMIN PO Take 1 tablet by mouth daily.    Marland Kitchen tiZANidine (ZANAFLEX) 2 MG tablet Take 1 tablet (2 mg total) by mouth every 6 (six) hours as needed for muscle spasms. 30 tablet 1  . traMADol (ULTRAM) 50 MG tablet Take 1 tablet (50 mg total) by mouth every 6 (six) hours as needed. 30 tablet 0  No current facility-administered medications on file prior to visit.    Review of Systems Constitutional: Negative for other unusual diaphoresis, sweats, appetite or weight changes HENT: Negative for other worsening hearing loss, ear pain, facial swelling, mouth sores or neck stiffness.   Eyes: Negative for other worsening pain, redness or other visual disturbance.  Respiratory: Negative for other stridor or swelling Cardiovascular: Negative for other palpitations or other chest pain  Gastrointestinal: Negative for worsening diarrhea or loose stools, blood in stool, distention or other pain Genitourinary:  Negative for hematuria, flank pain or other change in urine volume.  Musculoskeletal: Negative for myalgias or other joint swelling.  Skin: Negative for other color change, or other wound or worsening drainage.  Neurological: Negative for other syncope or numbness. Hematological: Negative for other adenopathy or swelling Psychiatric/Behavioral: Negative for hallucinations, other worsening agitation, SI, self-injury, or new decreased concentration All other system neg per pt    Objective:   Physical Exam BP 122/70   Pulse 87   Temp 98.9 F (37.2 C) (Oral)   Ht 5' (1.524 m)   Wt 122 lb (55.3 kg)   SpO2 96%   BMI 23.83 kg/m  VS noted,  Constitutional: Pt is oriented to person, place, and time. Appears well-developed and well-nourished, in no significant distress and comfortable Head: Normocephalic and atraumatic  Eyes: Conjunctivae and EOM are normal. Pupils are equal, round, and reactive to light Right Ear: External ear normal without discharge Left Ear: External ear normal without discharge Nose: Nose without discharge or deformity Mouth/Throat: Oropharynx is without other ulcerations and moist  Neck: Normal range of motion. Neck supple. No JVD present. No tracheal deviation present or significant neck LA or mass Cardiovascular: Normal rate, regular rhythm, normal heart sounds and intact distal pulses.   Pulmonary/Chest: WOB normal and breath sounds without rales or wheezing  Abdominal: Soft. Bowel sounds are normal. NT. No HSM  Musculoskeletal: Normal range of motion. Exhibits no edema Lymphadenopathy: Has no other cervical adenopathy.  Neurological: Pt is alert and oriented to person, place, and time. Pt has normal reflexes. No cranial nerve deficit. Motor grossly intact, Gait intact Skin: Skin is warm and dry. No rash noted or new ulcerations Psychiatric:  Has normal mood and affect. Behavior is normal without agitation No other exam findings Lab Results  Component Value Date    WBC 5.9 02/11/2019   HGB 13.0 02/11/2019   HCT 38.8 02/11/2019   PLT 235.0 02/11/2019   GLUCOSE 115 (H) 02/11/2019   CHOL 225 (H) 02/11/2019   TRIG 135.0 02/11/2019   HDL 75.50 02/11/2019   LDLDIRECT 124.0 03/04/2013   LDLCALC 122 (H) 02/11/2019   ALT 14 02/11/2019   AST 20 02/11/2019   NA 138 02/11/2019   K 4.0 02/11/2019   CL 101 02/11/2019   CREATININE 0.71 02/11/2019   BUN 23 02/11/2019   CO2 32 02/11/2019   TSH 1.56 02/11/2019   HGBA1C 5.8 02/12/2019        Assessment & Plan:

## 2019-05-20 NOTE — Assessment & Plan Note (Signed)

## 2019-05-20 NOTE — Assessment & Plan Note (Signed)
stable overall by history and exam, recent data reviewed with pt, and pt to continue medical treatment as before,  to f/u any worsening symptoms or concerns  

## 2019-05-20 NOTE — Patient Instructions (Signed)
Please continue all other medications as before, and refills have been done if requested.  Please have the pharmacy call with any other refills you may need.  Please continue your efforts at being more active, low cholesterol diet, and weight control.  You are otherwise up to date with prevention measures today.  Please keep your appointments with your specialists as you may have planned  Please return in 6 months, or sooner if needed 

## 2019-06-30 DIAGNOSIS — H43813 Vitreous degeneration, bilateral: Secondary | ICD-10-CM | POA: Diagnosis not present

## 2019-06-30 DIAGNOSIS — H2513 Age-related nuclear cataract, bilateral: Secondary | ICD-10-CM | POA: Diagnosis not present

## 2019-06-30 DIAGNOSIS — H35363 Drusen (degenerative) of macula, bilateral: Secondary | ICD-10-CM | POA: Diagnosis not present

## 2019-06-30 DIAGNOSIS — H35013 Changes in retinal vascular appearance, bilateral: Secondary | ICD-10-CM | POA: Diagnosis not present

## 2019-08-04 DIAGNOSIS — H903 Sensorineural hearing loss, bilateral: Secondary | ICD-10-CM | POA: Diagnosis not present

## 2019-08-04 DIAGNOSIS — H9193 Unspecified hearing loss, bilateral: Secondary | ICD-10-CM | POA: Diagnosis not present

## 2019-08-23 DIAGNOSIS — H35013 Changes in retinal vascular appearance, bilateral: Secondary | ICD-10-CM | POA: Diagnosis not present

## 2019-08-23 DIAGNOSIS — H2513 Age-related nuclear cataract, bilateral: Secondary | ICD-10-CM | POA: Diagnosis not present

## 2019-08-23 DIAGNOSIS — H2512 Age-related nuclear cataract, left eye: Secondary | ICD-10-CM | POA: Diagnosis not present

## 2019-08-23 DIAGNOSIS — H35363 Drusen (degenerative) of macula, bilateral: Secondary | ICD-10-CM | POA: Diagnosis not present

## 2019-08-23 DIAGNOSIS — H25013 Cortical age-related cataract, bilateral: Secondary | ICD-10-CM | POA: Diagnosis not present

## 2019-08-24 ENCOUNTER — Other Ambulatory Visit: Payer: Self-pay | Admitting: Internal Medicine

## 2019-08-24 MED ORDER — CANDESARTAN CILEXETIL 32 MG PO TABS: 32.0000 mg | ORAL_TABLET | Freq: Every day | ORAL | 3 refills | Status: DC

## 2019-08-24 NOTE — Telephone Encounter (Signed)
Medication:  candesartan (ATACAND) 32 MG tablet   Patient is requesting a refill of this medication.    Pharmacy:  [facility], [place]. [phone] (Phone) [phone] (Fax)

## 2019-09-21 DIAGNOSIS — H25812 Combined forms of age-related cataract, left eye: Secondary | ICD-10-CM | POA: Diagnosis not present

## 2019-09-21 DIAGNOSIS — H2512 Age-related nuclear cataract, left eye: Secondary | ICD-10-CM | POA: Diagnosis not present

## 2019-10-11 DIAGNOSIS — H25011 Cortical age-related cataract, right eye: Secondary | ICD-10-CM | POA: Diagnosis not present

## 2019-10-11 DIAGNOSIS — H2511 Age-related nuclear cataract, right eye: Secondary | ICD-10-CM | POA: Diagnosis not present

## 2019-10-19 DIAGNOSIS — H2511 Age-related nuclear cataract, right eye: Secondary | ICD-10-CM | POA: Diagnosis not present

## 2019-10-19 DIAGNOSIS — H25011 Cortical age-related cataract, right eye: Secondary | ICD-10-CM | POA: Diagnosis not present

## 2019-10-19 DIAGNOSIS — H25811 Combined forms of age-related cataract, right eye: Secondary | ICD-10-CM | POA: Diagnosis not present

## 2019-11-18 ENCOUNTER — Other Ambulatory Visit: Payer: Self-pay

## 2019-11-18 ENCOUNTER — Encounter: Payer: Self-pay | Admitting: Internal Medicine

## 2019-11-18 ENCOUNTER — Ambulatory Visit (INDEPENDENT_AMBULATORY_CARE_PROVIDER_SITE_OTHER): Payer: Medicare Other | Admitting: Internal Medicine

## 2019-11-18 ENCOUNTER — Other Ambulatory Visit (INDEPENDENT_AMBULATORY_CARE_PROVIDER_SITE_OTHER): Payer: Medicare Other

## 2019-11-18 VITALS — BP 142/86 | HR 74 | Temp 98.1°F | Ht 60.0 in | Wt 124.0 lb

## 2019-11-18 DIAGNOSIS — I10 Essential (primary) hypertension: Secondary | ICD-10-CM | POA: Diagnosis not present

## 2019-11-18 DIAGNOSIS — F419 Anxiety disorder, unspecified: Secondary | ICD-10-CM | POA: Diagnosis not present

## 2019-11-18 DIAGNOSIS — F329 Major depressive disorder, single episode, unspecified: Secondary | ICD-10-CM

## 2019-11-18 DIAGNOSIS — E785 Hyperlipidemia, unspecified: Secondary | ICD-10-CM

## 2019-11-18 DIAGNOSIS — E538 Deficiency of other specified B group vitamins: Secondary | ICD-10-CM

## 2019-11-18 DIAGNOSIS — Z23 Encounter for immunization: Secondary | ICD-10-CM

## 2019-11-18 DIAGNOSIS — F32A Depression, unspecified: Secondary | ICD-10-CM

## 2019-11-18 DIAGNOSIS — R7302 Impaired glucose tolerance (oral): Secondary | ICD-10-CM

## 2019-11-18 DIAGNOSIS — E559 Vitamin D deficiency, unspecified: Secondary | ICD-10-CM

## 2019-11-18 DIAGNOSIS — M519 Unspecified thoracic, thoracolumbar and lumbosacral intervertebral disc disorder: Secondary | ICD-10-CM

## 2019-11-18 DIAGNOSIS — D509 Iron deficiency anemia, unspecified: Secondary | ICD-10-CM

## 2019-11-18 LAB — CBC WITH DIFFERENTIAL/PLATELET
Basophils Absolute: 0.1 10*3/uL (ref 0.0–0.1)
Basophils Relative: 1.2 % (ref 0.0–3.0)
Eosinophils Absolute: 0.2 10*3/uL (ref 0.0–0.7)
Eosinophils Relative: 2.6 % (ref 0.0–5.0)
HCT: 39.8 % (ref 36.0–46.0)
Hemoglobin: 13.1 g/dL (ref 12.0–15.0)
Lymphocytes Relative: 38 % (ref 12.0–46.0)
Lymphs Abs: 2.3 10*3/uL (ref 0.7–4.0)
MCHC: 32.9 g/dL (ref 30.0–36.0)
MCV: 90.3 fl (ref 78.0–100.0)
Monocytes Absolute: 0.4 10*3/uL (ref 0.1–1.0)
Monocytes Relative: 7 % (ref 3.0–12.0)
Neutro Abs: 3.1 10*3/uL (ref 1.4–7.7)
Neutrophils Relative %: 51.2 % (ref 43.0–77.0)
Platelets: 195 10*3/uL (ref 150.0–400.0)
RBC: 4.41 Mil/uL (ref 3.87–5.11)
RDW: 13.7 % (ref 11.5–15.5)
WBC: 6.1 10*3/uL (ref 4.0–10.5)

## 2019-11-18 LAB — BASIC METABOLIC PANEL
BUN: 24 mg/dL — ABNORMAL HIGH (ref 6–23)
CO2: 26 mEq/L (ref 19–32)
Calcium: 9.7 mg/dL (ref 8.4–10.5)
Chloride: 102 mEq/L (ref 96–112)
Creatinine, Ser: 0.63 mg/dL (ref 0.40–1.20)
GFR: 88.96 mL/min (ref >60.00)
Glucose, Bld: 111 mg/dL — ABNORMAL HIGH (ref 70–99)
Potassium: 4.5 mEq/L (ref 3.5–5.1)
Sodium: 136 mEq/L (ref 135–145)

## 2019-11-18 LAB — HEPATIC FUNCTION PANEL
ALT: 14 U/L (ref 0–35)
AST: 20 U/L (ref 0–37)
Albumin: 4.3 g/dL (ref 3.5–5.2)
Alkaline Phosphatase: 74 U/L (ref 39–117)
Bilirubin, Direct: 0.1 mg/dL (ref 0.0–0.3)
Total Bilirubin: 0.6 mg/dL (ref 0.2–1.2)
Total Protein: 7.3 g/dL (ref 6.0–8.3)

## 2019-11-18 LAB — LIPID PANEL
Cholesterol: 214 mg/dL — ABNORMAL HIGH (ref 0–200)
HDL: 69.1 mg/dL (ref >39.00)
LDL Cholesterol: 120 mg/dL — ABNORMAL HIGH (ref 0–99)
NonHDL: 144.57
Total CHOL/HDL Ratio: 3
Triglycerides: 125 mg/dL (ref 0.0–149.0)
VLDL: 25 mg/dL (ref 0.0–40.0)

## 2019-11-18 LAB — URINALYSIS, ROUTINE W REFLEX MICROSCOPIC
Bilirubin Urine: NEGATIVE
Ketones, ur: NEGATIVE
Nitrite: NEGATIVE
Specific Gravity, Urine: 1.025 (ref 1.000–1.030)
Total Protein, Urine: NEGATIVE
Urine Glucose: NEGATIVE
Urobilinogen, UA: 0.2 (ref 0.0–1.0)
pH: 5.5 (ref 5.0–8.0)

## 2019-11-18 LAB — TSH: TSH: 1.62 u[IU]/mL (ref 0.35–4.50)

## 2019-11-18 LAB — VITAMIN D 25 HYDROXY (VIT D DEFICIENCY, FRACTURES): VITD: 34.45 ng/mL (ref 30.00–100.00)

## 2019-11-18 LAB — IBC PANEL
Iron: 131 ug/dL (ref 42–145)
Saturation Ratios: 33.4 % (ref 20.0–50.0)
Transferrin: 280 mg/dL (ref 212.0–360.0)

## 2019-11-18 LAB — VITAMIN B12: Vitamin B-12: 454 pg/mL (ref 211–911)

## 2019-11-18 LAB — HEMOGLOBIN A1C: Hgb A1c MFr Bld: 6 % (ref 4.6–6.5)

## 2019-11-18 MED ORDER — CANDESARTAN CILEXETIL 32 MG PO TABS: 32.0000 mg | ORAL_TABLET | Freq: Every day | ORAL | 3 refills | Status: DC

## 2019-11-18 MED ORDER — CITALOPRAM HYDROBROMIDE 10 MG PO TABS: 10.0000 mg | ORAL_TABLET | Freq: Every day | ORAL | 3 refills | Status: DC

## 2019-11-18 NOTE — Progress Notes (Signed)
Subjective:    Patient ID: Carrie Page, female    DOB: September 25, 1928, 85 y.o.   MRN: [id number]  HPI  Here to f/u; overall doing ok,  Pt denies chest pain, increasing sob or doe, wheezing, orthopnea, PND, increased LE swelling, palpitations, dizziness or syncope.  Pt denies new neurological symptoms such as new headache, or facial or extremity weakness or numbness.  Pt denies polydipsia, polyuria, or low sugar episode.  Pt states overall good compliance with meds, mostly trying to follow appropriate diet, with wt overall stable,  but little exercise however. Has been statin intolerant.   BP < 140 at home. Has had mod worsening depressive symptoms and anxiety, but no suicidal ideation, or panic; has ongoing anxiety, not increased recently.  Pt continues to have recurring LBP without change in severity, bowel or bladder change, fever, wt loss,  worsening LE pain/numbness/weakness, gait change or falls. Past Medical History:  Diagnosis Date  . Allergic rhinitis, cause unspecified 01/28/2012  . Allergy   . Anemia   . Anxiety 01/28/2012  . Arthritis   . Cecal cancer ([place])   . Degenerative joint disease 01/28/2012  . Depression   . History of nonmelanoma skin cancer   . Hypertension   . Impaired glucose tolerance   . Osteoporosis    RT SHOULDER  . PONV (postoperative nausea and vomiting)    Past Surgical History:  Procedure Laterality Date  . ABDOMINAL HYSTERECTOMY    . APPENDECTOMY    . BACK SURGERY  2010   MICRODCISCECTOMY  . CHOLECYSTECTOMY    . LAPAROSCOPIC PARTIAL COLECTOMY N/A 06/20/2014   Procedure: LAPAROSCOPIC ASSISTED RIGHT PARTIAL COLECTOMY;  Surgeon: Leighton Ruff, MD;  Location: [facility];  Service: General;  Laterality: N/A;  . TONSILLECTOMY      reports that she has never smoked. She has never used smokeless tobacco. She reports that she does not drink alcohol or use drugs. family history includes Arthritis in an other family member; Colon cancer in her father; Heart disease  in an other family member; Stroke in an other family member. Allergies  Allergen Reactions  . Bactrim [Sulfamethoxazole-Trimethoprim] Itching, Rash and Other (See Comments)    Rash, itching, diaphoretic  . Lovastatin Other (See Comments)    Cognitive slowing and myalgias  . Nitrofurantoin Other (See Comments)    GI upset  . Penicillins Itching  . Pravastatin Other (See Comments)    Myalgias, increased dreams  . Naproxen Rash   Current Outpatient Medications on File Prior to Visit  Medication Sig Dispense Refill  . aspirin EC 81 MG tablet Take 81 mg by mouth daily.    . Cholecalciferol (VITAMIN D3) 1000 UNITS CAPS Take 1 capsule by mouth daily.    . Cyanocobalamin (VITAMIN B 12 PO) Take 1 tablet by mouth daily.    . diazepam (VALIUM) 5 MG tablet TAKE ONE TABLET BY MOUTH ONCE DAILY AS NEEDED FOR ANXIETY 30 tablet 2  . MULTIPLE VITAMIN PO Take 1 tablet by mouth daily.    Marland Kitchen tiZANidine (ZANAFLEX) 2 MG tablet Take 1 tablet (2 mg total) by mouth every 6 (six) hours as needed for muscle spasms. 30 tablet 1  . traMADol (ULTRAM) 50 MG tablet Take 1 tablet (50 mg total) by mouth every 6 (six) hours as needed. 30 tablet 0   No current facility-administered medications on file prior to visit.   Review of Systems  Constitutional: Negative for other unusual diaphoresis or sweats HENT: Negative for ear discharge or swelling  Eyes: Negative for other worsening visual disturbances Respiratory: Negative for stridor or other swelling  Gastrointestinal: Negative for worsening distension or other blood Genitourinary: Negative for retention or other urinary change Musculoskeletal: Negative for other MSK pain or swelling Skin: Negative for color change or other new lesions Neurological: Negative for worsening tremors and other numbness  Psychiatric/Behavioral: Negative for worsening agitation or other fatigue All otherwise neg per pt     Objective:   Physical Exam BP (!) 142/86   Pulse 74   Temp  98.1 F (36.7 C) (Oral)   Ht 5' (1.524 m)   Wt 124 lb (56.2 kg)   SpO2 98%   BMI 24.22 kg/m  VS noted,  Constitutional: Pt appears in NAD HENT: Head: NCAT.  Right Ear: External ear normal.  Left Ear: External ear normal.  Eyes: . Pupils are equal, round, and reactive to light. Conjunctivae and EOM are normal Nose: without d/c or deformity Neck: Neck supple. Gross normal ROM Cardiovascular: Normal rate and regular rhythm.   Pulmonary/Chest: Effort normal and breath sounds without rales or wheezing.  Abd:  Soft, NT, ND, + BS, no organomegaly Neurological: Pt is alert. At baseline orientation, motor grossly intact Skin: Skin is warm. No rashes, other new lesions, no LE edema Psychiatric: Pt behavior is normal without agitation , nervous depressed affect and moood All otherwise neg per pt Lab Results  Component Value Date   WBC 6.1 11/18/2019   HGB 13.1 11/18/2019   HCT 39.8 11/18/2019   PLT 195.0 11/18/2019   GLUCOSE 111 (H) 11/18/2019   CHOL 214 (H) 11/18/2019   TRIG 125.0 11/18/2019   HDL 69.10 11/18/2019   LDLDIRECT 124.0 03/04/2013   LDLCALC 120 (H) 11/18/2019   ALT 14 11/18/2019   AST 20 11/18/2019   NA 136 11/18/2019   K 4.5 11/18/2019   CL 102 11/18/2019   CREATININE 0.63 11/18/2019   BUN 24 (H) 11/18/2019   CO2 26 11/18/2019   TSH 1.62 11/18/2019   HGBA1C 6.0 11/18/2019      Assessment & Plan:

## 2019-11-18 NOTE — Assessment & Plan Note (Signed)
stable overall by history and exam, recent data reviewed with pt, and pt to continue medical treatment as before,  to f/u any worsening symptoms or concerns  

## 2019-11-18 NOTE — Assessment & Plan Note (Addendum)
stable overall by history and exam, recent data reviewed with pt, and pt to continue medical treatment as before,  to f/u any worsening symptoms or concerns  Note:  Total time for pt hx, exam, review of record with pt in the room, determination of diagnoses and plan for further eval and tx is > 40 min, with over 50% spent in coordination and counseling of patient including the differential dx, tx, further evaluation and other management of hyperglycemia, HTN, depression, anxiety, HLD, iron def anemia, lumbar disc dz

## 2019-11-18 NOTE — Assessment & Plan Note (Signed)
Mild to mod, for celexa 10 qd,  to f/u any worsening symptoms or concerns 

## 2019-11-18 NOTE — Patient Instructions (Addendum)
You had the flu shot today  Please take all new medication as prescribed - the celexa 10 mg per day for nerves  Please continue all other medications as before, including the valium you have if needed  Please have the pharmacy call with any other refills you may need.  Please continue your efforts at being more active, low cholesterol diet, and weight control.  You are otherwise up to date with prevention measures today.  Please keep your appointments with your specialists as you may have planned  Please go to the LAB in the Basement (turn left off the elevator) for the tests to be done today  You will be contacted by phone if any changes need to be made immediately.  Otherwise, you will receive a letter about your results with an explanation, but please check with MyChart first.  Please remember to sign up for MyChart if you have not done so, as this will be important to you in the future with finding out test results, communicating by private email, and scheduling acute appointments online when needed.  Please return in 6 months, or sooner if needed

## 2019-11-18 NOTE — Assessment & Plan Note (Signed)
Mild to mod, to continue valium prn, to f/u any worsening symptoms or concerns

## 2019-11-18 NOTE — Assessment & Plan Note (Signed)
Statin intolerant, for low chol diet 

## 2019-11-18 NOTE — Assessment & Plan Note (Signed)
For iron f/u, stable overall by history and exam, recent data reviewed with pt, and pt to continue medical treatment as before,  to f/u any worsening symptoms or concerns

## 2020-05-18 ENCOUNTER — Ambulatory Visit: Payer: Medicare Other | Admitting: Internal Medicine

## 2020-05-23 DIAGNOSIS — Z961 Presence of intraocular lens: Secondary | ICD-10-CM | POA: Diagnosis not present

## 2020-05-23 DIAGNOSIS — H35013 Changes in retinal vascular appearance, bilateral: Secondary | ICD-10-CM | POA: Diagnosis not present

## 2020-05-23 DIAGNOSIS — H35363 Drusen (degenerative) of macula, bilateral: Secondary | ICD-10-CM | POA: Diagnosis not present

## 2020-05-23 DIAGNOSIS — H26492 Other secondary cataract, left eye: Secondary | ICD-10-CM | POA: Diagnosis not present

## 2020-05-23 DIAGNOSIS — H43813 Vitreous degeneration, bilateral: Secondary | ICD-10-CM | POA: Diagnosis not present

## 2020-05-24 ENCOUNTER — Other Ambulatory Visit: Payer: Self-pay

## 2020-05-24 ENCOUNTER — Encounter: Payer: Self-pay | Admitting: Internal Medicine

## 2020-05-24 ENCOUNTER — Ambulatory Visit (INDEPENDENT_AMBULATORY_CARE_PROVIDER_SITE_OTHER): Payer: Medicare Other | Admitting: Internal Medicine

## 2020-05-24 VITALS — BP 126/84 | HR 81 | Temp 98.4°F | Ht 60.0 in | Wt 123.0 lb

## 2020-05-24 DIAGNOSIS — R7302 Impaired glucose tolerance (oral): Secondary | ICD-10-CM | POA: Diagnosis not present

## 2020-05-24 DIAGNOSIS — Z0000 Encounter for general adult medical examination without abnormal findings: Secondary | ICD-10-CM

## 2020-05-24 LAB — BASIC METABOLIC PANEL
BUN: 19 mg/dL (ref 6–23)
CO2: 30 mEq/L (ref 19–32)
Calcium: 9.9 mg/dL (ref 8.4–10.5)
Chloride: 100 mEq/L (ref 96–112)
Creatinine, Ser: 0.6 mg/dL (ref 0.40–1.20)
GFR: 94 mL/min (ref >60.00)
Glucose, Bld: 102 mg/dL — ABNORMAL HIGH (ref 70–99)
Potassium: 4.2 mEq/L (ref 3.5–5.1)
Sodium: 136 mEq/L (ref 135–145)

## 2020-05-24 LAB — LIPID PANEL
Cholesterol: 214 mg/dL — ABNORMAL HIGH (ref 0–200)
HDL: 64.7 mg/dL (ref >39.00)
LDL Cholesterol: 120 mg/dL — ABNORMAL HIGH (ref 0–99)
NonHDL: 149.61
Total CHOL/HDL Ratio: 3
Triglycerides: 150 mg/dL — ABNORMAL HIGH (ref 0.0–149.0)
VLDL: 30 mg/dL (ref 0.0–40.0)

## 2020-05-24 LAB — URINALYSIS, ROUTINE W REFLEX MICROSCOPIC
Bilirubin Urine: NEGATIVE
Hgb urine dipstick: NEGATIVE
Ketones, ur: NEGATIVE
Nitrite: NEGATIVE
Specific Gravity, Urine: 1.015 (ref 1.000–1.030)
Total Protein, Urine: NEGATIVE
Urine Glucose: NEGATIVE
Urobilinogen, UA: 0.2 (ref 0.0–1.0)
pH: 7 (ref 5.0–8.0)

## 2020-05-24 LAB — CBC WITH DIFFERENTIAL/PLATELET
Basophils Absolute: 0.1 10*3/uL (ref 0.0–0.1)
Basophils Relative: 1.4 % (ref 0.0–3.0)
Eosinophils Absolute: 0.1 10*3/uL (ref 0.0–0.7)
Eosinophils Relative: 2.2 % (ref 0.0–5.0)
HCT: 39.3 % (ref 36.0–46.0)
Hemoglobin: 13.1 g/dL (ref 12.0–15.0)
Lymphocytes Relative: 40.3 % (ref 12.0–46.0)
Lymphs Abs: 2.5 10*3/uL (ref 0.7–4.0)
MCHC: 33.2 g/dL (ref 30.0–36.0)
MCV: 88.1 fl (ref 78.0–100.0)
Monocytes Absolute: 0.3 10*3/uL (ref 0.1–1.0)
Monocytes Relative: 4.8 % (ref 3.0–12.0)
Neutro Abs: 3.2 10*3/uL (ref 1.4–7.7)
Neutrophils Relative %: 51.3 % (ref 43.0–77.0)
Platelets: 204 10*3/uL (ref 150.0–400.0)
RBC: 4.46 Mil/uL (ref 3.87–5.11)
RDW: 13.6 % (ref 11.5–15.5)
WBC: 6.2 10*3/uL (ref 4.0–10.5)

## 2020-05-24 LAB — HEPATIC FUNCTION PANEL
ALT: 14 U/L (ref 0–35)
AST: 22 U/L (ref 0–37)
Albumin: 4.3 g/dL (ref 3.5–5.2)
Alkaline Phosphatase: 76 U/L (ref 39–117)
Bilirubin, Direct: 0.1 mg/dL (ref 0.0–0.3)
Total Bilirubin: 0.5 mg/dL (ref 0.2–1.2)
Total Protein: 7.6 g/dL (ref 6.0–8.3)

## 2020-05-24 LAB — TSH: TSH: 1.54 u[IU]/mL (ref 0.35–4.50)

## 2020-05-24 MED ORDER — DIAZEPAM 5 MG PO TABS: ORAL_TABLET | ORAL | 2 refills | Status: DC

## 2020-05-24 NOTE — Progress Notes (Signed)
Subjective:    Patient ID: Carrie Page, female    DOB: [birth year], 86 y.o.   MRN: [id number]  HPI  Here for wellness and f/u;  Overall doing ok;  Pt denies Chest pain, worsening SOB, DOE, wheezing, orthopnea, PND, worsening LE edema, palpitations, dizziness or syncope.  Pt denies neurological change such as new headache, facial or extremity weakness.  Pt denies polydipsia, polyuria, or low sugar symptoms. Pt states overall good compliance with treatment and medications, good tolerability, and has been trying to follow appropriate diet.  Pt denies worsening depressive symptoms, suicidal ideation or panic. No fever, night sweats, wt loss, loss of appetite, or other constitutional symptoms.  Pt states good ability with ADL's, has low fall risk, home safety reviewed and adequate, no other significant changes in hearing or vision, and only occasionally active with exercise.  No new complaints Past Medical History:  Diagnosis Date  . Allergic rhinitis, cause unspecified 01/28/2012  . Allergy   . Anemia   . Anxiety 01/28/2012  . Arthritis   . Cecal cancer ([place])   . Degenerative joint disease 01/28/2012  . Depression   . History of nonmelanoma skin cancer   . Hypertension   . Impaired glucose tolerance   . Osteoporosis    RT SHOULDER  . PONV (postoperative nausea and vomiting)    Past Surgical History:  Procedure Laterality Date  . ABDOMINAL HYSTERECTOMY    . APPENDECTOMY    . BACK SURGERY  2010   MICRODCISCECTOMY  . CHOLECYSTECTOMY    . LAPAROSCOPIC PARTIAL COLECTOMY N/A 06/20/2014   Procedure: LAPAROSCOPIC ASSISTED RIGHT PARTIAL COLECTOMY;  Surgeon: Leighton Ruff, MD;  Location: [facility];  Service: General;  Laterality: N/A;  . TONSILLECTOMY      reports that she has never smoked. She has never used smokeless tobacco. She reports that she does not drink alcohol and does not use drugs. family history includes Arthritis in an other family member; Colon cancer in her father; Heart disease  in an other family member; Stroke in an other family member. Allergies  Allergen Reactions  . Bactrim [Sulfamethoxazole-Trimethoprim] Itching, Rash and Other (See Comments)    Rash, itching, diaphoretic  . Lovastatin Other (See Comments)    Cognitive slowing and myalgias  . Nitrofurantoin Other (See Comments)    GI upset  . Penicillins Itching  . Pravastatin Other (See Comments)    Myalgias, increased dreams  . Naproxen Rash   Current Outpatient Medications on File Prior to Visit  Medication Sig Dispense Refill  . aspirin EC 81 MG tablet Take 81 mg by mouth daily.    . candesartan (ATACAND) 32 MG tablet Take 1 tablet (32 mg total) by mouth daily. 90 tablet 3  . Cholecalciferol (VITAMIN D3) 1000 UNITS CAPS Take 1 capsule by mouth daily.    . citalopram (CELEXA) 10 MG tablet Take 1 tablet (10 mg total) by mouth daily. 90 tablet 3  . Cyanocobalamin (VITAMIN B 12 PO) Take 1 tablet by mouth daily.    . MULTIPLE VITAMIN PO Take 1 tablet by mouth daily.     No current facility-administered medications on file prior to visit.   Review of Systems All otherwise neg per pt    Objective:   Physical Exam BP 126/84   Pulse 81   Temp 98.4 F (36.9 C) (Oral)   Ht 5' (1.524 m)   Wt 123 lb (55.8 kg)   SpO2 97%   BMI 24.02 kg/m  VS noted,  Constitutional:  Pt appears in NAD HENT: Head: NCAT.  Right Ear: External ear normal.  Left Ear: External ear normal.  Eyes: . Pupils are equal, round, and reactive to light. Conjunctivae and EOM are normal Nose: without d/c or deformity Neck: Neck supple. Gross normal ROM Cardiovascular: Normal rate and regular rhythm.   Pulmonary/Chest: Effort normal and breath sounds without rales or wheezing.  Abd:  Soft, NT, ND, + BS, no organomegaly Neurological: Pt is alert. At baseline orientation, motor grossly intact Skin: Skin is warm. No rashes, other new lesions, no LE edema Psychiatric: Pt behavior is normal without agitation  All otherwise neg per  pt Lab Results  Component Value Date   WBC 6.1 11/18/2019   HGB 13.1 11/18/2019   HCT 39.8 11/18/2019   PLT 195.0 11/18/2019   GLUCOSE 111 (H) 11/18/2019   CHOL 214 (H) 11/18/2019   TRIG 125.0 11/18/2019   HDL 69.10 11/18/2019   LDLDIRECT 124.0 03/04/2013   LDLCALC 120 (H) 11/18/2019   ALT 14 11/18/2019   AST 20 11/18/2019   NA 136 11/18/2019   K 4.5 11/18/2019   CL 102 11/18/2019   CREATININE 0.63 11/18/2019   BUN 24 (H) 11/18/2019   CO2 26 11/18/2019   TSH 1.62 11/18/2019   HGBA1C 6.0 11/18/2019      Assessment & Plan:

## 2020-05-24 NOTE — Assessment & Plan Note (Signed)

## 2020-05-24 NOTE — Patient Instructions (Signed)
Please continue all other medications as before, and refills have been done if requested - the valium  Please have the pharmacy call with any other refills you may need.  Please continue your efforts at being more active, low cholesterol diet, and weight control.  You are otherwise up to date with prevention measures today.  Please keep your appointments with your specialists as you may have planned  Please go to the LAB at the blood drawing area for the tests to be done  You will be contacted by phone if any changes need to be made immediately.  Otherwise, you will receive a letter about your results with an explanation, but please check with MyChart first.  Please remember to sign up for MyChart if you have not done so, as this will be important to you in the future with finding out test results, communicating by private email, and scheduling acute appointments online when needed.  Please make an Appointment to return in 6 months, or sooner if needed

## 2020-05-24 NOTE — Assessment & Plan Note (Signed)
stable overall by history and exam, recent data reviewed with pt, and pt to continue medical treatment as before,  to f/u any worsening symptoms or concerns  

## 2020-05-25 ENCOUNTER — Encounter: Payer: Self-pay | Admitting: Internal Medicine

## 2020-05-25 LAB — HEMOGLOBIN A1C: Hgb A1c MFr Bld: 6.4 % (ref 4.6–6.5)

## 2020-11-08 ENCOUNTER — Encounter: Payer: Self-pay | Admitting: Internal Medicine

## 2020-11-08 ENCOUNTER — Ambulatory Visit (INDEPENDENT_AMBULATORY_CARE_PROVIDER_SITE_OTHER): Payer: Medicare Other | Admitting: Internal Medicine

## 2020-11-08 ENCOUNTER — Other Ambulatory Visit: Payer: Self-pay

## 2020-11-08 VITALS — BP 120/80 | HR 82 | Temp 98.0°F | Ht 60.0 in | Wt 124.0 lb

## 2020-11-08 DIAGNOSIS — I10 Essential (primary) hypertension: Secondary | ICD-10-CM

## 2020-11-08 DIAGNOSIS — R7302 Impaired glucose tolerance (oral): Secondary | ICD-10-CM | POA: Diagnosis not present

## 2020-11-08 DIAGNOSIS — F32A Depression, unspecified: Secondary | ICD-10-CM | POA: Diagnosis not present

## 2020-11-08 DIAGNOSIS — Z23 Encounter for immunization: Secondary | ICD-10-CM

## 2020-11-08 DIAGNOSIS — E785 Hyperlipidemia, unspecified: Secondary | ICD-10-CM | POA: Diagnosis not present

## 2020-11-08 DIAGNOSIS — C189 Malignant neoplasm of colon, unspecified: Secondary | ICD-10-CM

## 2020-11-08 NOTE — Progress Notes (Signed)
Subjective:    Patient ID: Carrie Page, female    DOB: 05/09/1928, 86 y.o.   MRN: [id number]  HPI  Here to f/u; overall doing ok,  Pt denies chest pain, increasing sob or doe, wheezing, orthopnea, PND, increased LE swelling, palpitations, dizziness or syncope.  Pt denies new neurological symptoms such as new headache, or facial or extremity weakness or numbness.  Pt denies polydipsia, polyuria, or low sugar episode.  Pt states overall good compliance with meds, mostly trying to follow appropriate diet, with wt overall stable,  but little exercise however.  Now off celexa and Denies worsening depressive symptoms, suicidal ideation, or panic.  No new complaints Past Medical History:  Diagnosis Date  . Allergic rhinitis, cause unspecified 01/28/2012  . Allergy   . Anemia   . Anxiety 01/28/2012  . Arthritis   . Cecal cancer ([place])   . Degenerative joint disease 01/28/2012  . Depression   . History of nonmelanoma skin cancer   . Hypertension   . Impaired glucose tolerance   . Osteoporosis    RT SHOULDER  . PONV (postoperative nausea and vomiting)    Past Surgical History:  Procedure Laterality Date  . ABDOMINAL HYSTERECTOMY    . APPENDECTOMY    . BACK SURGERY  2010   MICRODCISCECTOMY  . CHOLECYSTECTOMY    . LAPAROSCOPIC PARTIAL COLECTOMY N/A 06/20/2014   Procedure: LAPAROSCOPIC ASSISTED RIGHT PARTIAL COLECTOMY;  Surgeon: Leighton Ruff, MD;  Location: [facility];  Service: General;  Laterality: N/A;  . TONSILLECTOMY      reports that she has never smoked. She has never used smokeless tobacco. She reports that she does not drink alcohol and does not use drugs. family history includes Arthritis in an other family member; Colon cancer in her father; Heart disease in an other family member; Stroke in an other family member. Allergies  Allergen Reactions  . Bactrim [Sulfamethoxazole-Trimethoprim] Itching, Rash and Other (See Comments)    Rash, itching, diaphoretic  . Lovastatin Other (See  Comments)    Cognitive slowing and myalgias  . Nitrofurantoin Other (See Comments)    GI upset  . Penicillins Itching  . Pravastatin Other (See Comments)    Myalgias, increased dreams  . Naproxen Rash   Current Outpatient Medications on File Prior to Visit  Medication Sig Dispense Refill  . aspirin EC 81 MG tablet Take 81 mg by mouth daily.    . candesartan (ATACAND) 32 MG tablet Take 1 tablet (32 mg total) by mouth daily. 90 tablet 3  . Cholecalciferol (VITAMIN D3) 1000 UNITS CAPS Take 1 capsule by mouth daily.    . citalopram (CELEXA) 10 MG tablet Take 1 tablet (10 mg total) by mouth daily. 90 tablet 3  . Cyanocobalamin (VITAMIN B 12 PO) Take 1 tablet by mouth daily.    . diazepam (VALIUM) 5 MG tablet TAKE ONE TABLET BY MOUTH ONCE DAILY AS NEEDED FOR ANXIETY 30 tablet 2  . MULTIPLE VITAMIN PO Take 1 tablet by mouth daily.     No current facility-administered medications on file prior to visit.   Review of Systems All otherwise neg per pt    Objective:   Physical Exam BP 120/80 (BP Location: Left Arm, Patient Position: Sitting, Cuff Size: Large)   Pulse 82   Temp 98 F (36.7 C) (Oral)   Ht 5' (1.524 m)   Wt 124 lb (56.2 kg)   SpO2 97%   BMI 24.22 kg/m  VS noted,  Constitutional: Pt appears in  NAD HENT: Head: NCAT.  Right Ear: External ear normal.  Left Ear: External ear normal.  Eyes: . Pupils are equal, round, and reactive to light. Conjunctivae and EOM are normal Nose: without d/c or deformity Neck: Neck supple. Gross normal ROM Cardiovascular: Normal rate and regular rhythm.   Pulmonary/Chest: Effort normal and breath sounds without rales or wheezing.  Abd:  Soft, NT, ND, + BS, no organomegaly Neurological: Pt is alert. At baseline orientation, motor grossly intact Skin: Skin is warm. No rashes, other new lesions, no LE edema Psychiatric: Pt behavior is normal without agitation  All otherwise neg per pt Lab Results  Component Value Date   WBC 6.2 05/24/2020    HGB 13.1 05/24/2020   HCT 39.3 05/24/2020   PLT 204.0 05/24/2020   GLUCOSE 102 (H) 05/24/2020   CHOL 214 (H) 05/24/2020   TRIG 150.0 (H) 05/24/2020   HDL 64.70 05/24/2020   LDLDIRECT 124.0 03/04/2013   LDLCALC 120 (H) 05/24/2020   ALT 14 05/24/2020   AST 22 05/24/2020   NA 136 05/24/2020   K 4.2 05/24/2020   CL 100 05/24/2020   CREATININE 0.60 05/24/2020   BUN 19 05/24/2020   CO2 30 05/24/2020   TSH 1.54 05/24/2020   HGBA1C 6.4 05/24/2020      Assessment & Plan:

## 2020-11-08 NOTE — Patient Instructions (Signed)
You had the flu shot today  Ok to stay off the celexa medication  Please continue all other medications as before, and refills have been done if requested.  Please have the pharmacy call with any other refills you may need.  Please continue your efforts at being more active, low cholesterol diet, and weight control.  Please keep your appointments with your specialists as you may have planned  Please make an Appointment to return in 6 months, or sooner if needed

## 2020-11-12 ENCOUNTER — Encounter: Payer: Self-pay | Admitting: Internal Medicine

## 2020-11-12 NOTE — Assessment & Plan Note (Signed)
Lab Results  Component Value Date   LDLCALC 120 (H) 05/24/2020  stable overall by history and exam, recent data reviewed with pt, and pt to continue medical treatment as before,  to f/u any worsening symptoms or concerns, declines further change in tx, prefers diet only

## 2020-11-12 NOTE — Assessment & Plan Note (Addendum)

## 2020-11-12 NOTE — Assessment & Plan Note (Signed)
BP Readings from Last 3 Encounters:  11/08/20 120/80  05/24/20 126/84  11/18/19 (!) 142/86  stable overall by history and exam, recent data reviewed with pt, and pt to continue medical treatment as before,  to f/u any worsening symptoms or concerns

## 2020-11-12 NOTE — Assessment & Plan Note (Signed)
Resolved per pt, ok to continue off celexa,  to f/u any worsening symptoms or concerns

## 2020-11-23 ENCOUNTER — Ambulatory Visit: Payer: Medicare Other | Admitting: Internal Medicine

## 2020-11-24 ENCOUNTER — Telehealth: Payer: Self-pay | Admitting: Internal Medicine

## 2020-11-24 ENCOUNTER — Other Ambulatory Visit: Payer: Self-pay | Admitting: Internal Medicine

## 2020-11-24 ENCOUNTER — Other Ambulatory Visit: Payer: Self-pay

## 2020-11-24 MED ORDER — CANDESARTAN CILEXETIL 32 MG PO TABS: 32.0000 mg | ORAL_TABLET | Freq: Every day | ORAL | 3 refills | Status: DC

## 2020-11-24 NOTE — Telephone Encounter (Signed)
1.Medication Requested: candesartan (ATACAND) 32 MG tablet 2. Pharmacy (Name, Street, [place]): [facility], [place]. Phone:  [phone]  Fax:  [phone]      3. On Med List: yes 4. Last Visit with PCP: 12.01.21 5. Next visit date with PCP: 06.01.22  Agent: Please be advised that RX refills may take up to 3 business days. We ask that you follow-up with your pharmacy.

## 2020-11-24 NOTE — Telephone Encounter (Signed)
Please refill as per office routine med refill policy (all routine meds refilled for 3 mo or monthly per pt preference up to one year from last visit, then month to month grace period for 3 mo, then further med refills will have to be denied)  

## 2021-05-09 ENCOUNTER — Ambulatory Visit (INDEPENDENT_AMBULATORY_CARE_PROVIDER_SITE_OTHER): Payer: Medicare Other | Admitting: Internal Medicine

## 2021-05-09 ENCOUNTER — Other Ambulatory Visit: Payer: Self-pay

## 2021-05-09 ENCOUNTER — Ambulatory Visit (INDEPENDENT_AMBULATORY_CARE_PROVIDER_SITE_OTHER): Payer: Medicare Other

## 2021-05-09 VITALS — BP 140/80 | HR 79 | Temp 98.2°F | Ht 60.0 in | Wt 123.8 lb

## 2021-05-09 DIAGNOSIS — R7302 Impaired glucose tolerance (oral): Secondary | ICD-10-CM | POA: Diagnosis not present

## 2021-05-09 DIAGNOSIS — Z0000 Encounter for general adult medical examination without abnormal findings: Secondary | ICD-10-CM | POA: Diagnosis not present

## 2021-05-09 DIAGNOSIS — E538 Deficiency of other specified B group vitamins: Secondary | ICD-10-CM

## 2021-05-09 DIAGNOSIS — I10 Essential (primary) hypertension: Secondary | ICD-10-CM

## 2021-05-09 DIAGNOSIS — E559 Vitamin D deficiency, unspecified: Secondary | ICD-10-CM | POA: Diagnosis not present

## 2021-05-09 LAB — URINALYSIS, ROUTINE W REFLEX MICROSCOPIC
Bilirubin Urine: NEGATIVE
Ketones, ur: NEGATIVE
Nitrite: NEGATIVE
Specific Gravity, Urine: 1.02 (ref 1.000–1.030)
Total Protein, Urine: NEGATIVE
Urine Glucose: NEGATIVE
Urobilinogen, UA: 0.2 (ref 0.0–1.0)
pH: 6.5 (ref 5.0–8.0)

## 2021-05-09 LAB — CBC WITH DIFFERENTIAL/PLATELET
Basophils Absolute: 0.1 10*3/uL (ref 0.0–0.1)
Basophils Relative: 1.1 % (ref 0.0–3.0)
Eosinophils Absolute: 0.1 10*3/uL (ref 0.0–0.7)
Eosinophils Relative: 2.1 % (ref 0.0–5.0)
HCT: 40.2 % (ref 36.0–46.0)
Hemoglobin: 13.4 g/dL (ref 12.0–15.0)
Lymphocytes Relative: 34.5 % (ref 12.0–46.0)
Lymphs Abs: 2.1 10*3/uL (ref 0.7–4.0)
MCHC: 33.2 g/dL (ref 30.0–36.0)
MCV: 88.2 fl (ref 78.0–100.0)
Monocytes Absolute: 0.4 10*3/uL (ref 0.1–1.0)
Monocytes Relative: 6.1 % (ref 3.0–12.0)
Neutro Abs: 3.4 10*3/uL (ref 1.4–7.7)
Neutrophils Relative %: 56.2 % (ref 43.0–77.0)
Platelets: 205 10*3/uL (ref 150.0–400.0)
RBC: 4.55 Mil/uL (ref 3.87–5.11)
RDW: 13.8 % (ref 11.5–15.5)
WBC: 6.1 10*3/uL (ref 4.0–10.5)

## 2021-05-09 LAB — HEMOGLOBIN A1C: Hgb A1c MFr Bld: 6.2 % (ref 4.6–6.5)

## 2021-05-09 LAB — TSH: TSH: 1.63 u[IU]/mL (ref 0.35–4.50)

## 2021-05-09 LAB — VITAMIN D 25 HYDROXY (VIT D DEFICIENCY, FRACTURES): VITD: 39.67 ng/mL (ref 30.00–100.00)

## 2021-05-09 LAB — VITAMIN B12: Vitamin B-12: 361 pg/mL (ref 211–911)

## 2021-05-09 NOTE — Patient Instructions (Addendum)
Carrie Page , Thank you for taking time to come for your Medicare Wellness Visit. I appreciate your ongoing commitment to your health goals. Please review the following plan we discussed and let me know if I can assist you in the future.   Screening recommendations/referrals: Colonoscopy: last done on 08/01/2015; no repeat due to age 87: no repeat due to age Bone Density: last done on 07/21/2017 (low bone mass); completed no longer recommended Recommended yearly ophthalmology/optometry visit for glaucoma screening and checkup Recommended yearly dental visit for hygiene and checkup  Vaccinations: Influenza vaccine: 11/08/2020 Pneumococcal vaccine: 01/28/2012, 09/09/2013 Tdap vaccine: 03/19/2016; due every 10 years Shingrix vaccine: Please call your insurance company to determine your out of pocket expense for the Shingrix vaccine. You may receive this vaccine at your local pharmacy.   Covid-19: 04/19/2020, 05/17/2020, 12/13/2020  Advanced directives: Please bring a copy of your health care power of attorney and living will to the office at your convenience.  Conditions/risks identified: Yes; Reviewed health maintenance screenings with patient today and relevant education, vaccines, and/or referrals were provided. Please continue to do your personal lifestyle choices by: daily care of teeth and gums, regular physical activity (goal should be 5 days a week for 30 minutes), eat a healthy diet, avoid tobacco and drug use, limiting any alcohol intake, taking a low-dose aspirin (if not allergic or have been advised by your provider otherwise) and taking vitamins and minerals as recommended by your provider. Continue doing brain stimulating activities (puzzles, reading, adult coloring books, staying active) to keep memory sharp. Continue to eat heart healthy diet (full of fruits, vegetables, whole grains, lean protein, water--limit salt, fat, and sugar intake) and increase physical activity as  tolerated.  Next appointment: Please schedule your next Medicare Wellness Visit with your Nurse Health Advisor in 1 year by calling [phone].   Preventive Care 50 Years and Older, Female Preventive care refers to lifestyle choices and visits with your health care provider that can promote health and wellness. What does preventive care include?  A yearly physical exam. This is also called an annual well check.  Dental exams once or twice a year.  Routine eye exams. Ask your health care provider how often you should have your eyes checked.  Personal lifestyle choices, including:  Daily care of your teeth and gums.  Regular physical activity.  Eating a healthy diet.  Avoiding tobacco and drug use.  Limiting alcohol use.  Practicing safe sex.  Taking low-dose aspirin every day.  Taking vitamin and mineral supplements as recommended by your health care provider. What happens during an annual well check? The services and screenings done by your health care provider during your annual well check will depend on your age, overall health, lifestyle risk factors, and family history of disease. Counseling  Your health care provider may ask you questions about your:  Alcohol use.  Tobacco use.  Drug use.  Emotional well-being.  Home and relationship well-being.  Sexual activity.  Eating habits.  History of falls.  Memory and ability to understand (cognition).  Work and work Statistician.  Reproductive health. Screening  You may have the following tests or measurements:  Height, weight, and BMI.  Blood pressure.  Lipid and cholesterol levels. These may be checked every 5 years, or more frequently if you are over 75 years old.  Skin check.  Lung cancer screening. You may have this screening every year starting at age 24 if you have a 30-pack-year history of smoking and currently smoke  or have quit within the past 15 years.  Fecal occult blood test (FOBT) of  the stool. You may have this test every year starting at age 31.  Flexible sigmoidoscopy or colonoscopy. You may have a sigmoidoscopy every 5 years or a colonoscopy every 10 years starting at age 21.  Hepatitis C blood test.  Hepatitis B blood test.  Sexually transmitted disease (STD) testing.  Diabetes screening. This is done by checking your blood sugar (glucose) after you have not eaten for a while (fasting). You may have this done every 1-3 years.  Bone density scan. This is done to screen for osteoporosis. You may have this done starting at age 34.  Mammogram. This may be done every 1-2 years. Talk to your health care provider about how often you should have regular mammograms. Talk with your health care provider about your test results, treatment options, and if necessary, the need for more tests. Vaccines  Your health care provider may recommend certain vaccines, such as:  Influenza vaccine. This is recommended every year.  Tetanus, diphtheria, and acellular pertussis (Tdap, Td) vaccine. You may need a Td booster every 10 years.  Zoster vaccine. You may need this after age 20.  Pneumococcal 13-valent conjugate (PCV13) vaccine. One dose is recommended after age 91.  Pneumococcal polysaccharide (PPSV23) vaccine. One dose is recommended after age 36. Talk to your health care provider about which screenings and vaccines you need and how often you need them. This information is not intended to replace advice given to you by your health care provider. Make sure you discuss any questions you have with your health care provider. Document Released: 12/22/2015 Document Revised: 08/14/2016 Document Reviewed: 09/26/2015 Elsevier Interactive Patient Education  2017 [place] Prevention in the Home Falls can cause injuries. They can happen to people of all ages. There are many things you can do to make your home safe and to help prevent falls. What can I do on the outside of my  home?  Regularly fix the edges of walkways and driveways and fix any cracks.  Remove anything that might make you trip as you walk through a door, such as a raised step or threshold.  Trim any bushes or trees on the path to your home.  Use bright outdoor lighting.  Clear any walking paths of anything that might make someone trip, such as rocks or tools.  Regularly check to see if handrails are loose or broken. Make sure that both sides of any steps have handrails.  Any raised decks and porches should have guardrails on the edges.  Have any leaves, snow, or ice cleared regularly.  Use sand or salt on walking paths during winter.  Clean up any spills in your garage right away. This includes oil or grease spills. What can I do in the bathroom?  Use night lights.  Install grab bars by the toilet and in the tub and shower. Do not use towel bars as grab bars.  Use non-skid mats or decals in the tub or shower.  If you need to sit down in the shower, use a plastic, non-slip stool.  Keep the floor dry. Clean up any water that spills on the floor as soon as it happens.  Remove soap buildup in the tub or shower regularly.  Attach bath mats securely with double-sided non-slip rug tape.  Do not have throw rugs and other things on the floor that can make you trip. What can I do in the  bedroom?  Use night lights.  Make sure that you have a light by your bed that is easy to reach.  Do not use any sheets or blankets that are too big for your bed. They should not hang down onto the floor.  Have a firm chair that has side arms. You can use this for support while you get dressed.  Do not have throw rugs and other things on the floor that can make you trip. What can I do in the kitchen?  Clean up any spills right away.  Avoid walking on wet floors.  Keep items that you use a lot in easy-to-reach places.  If you need to reach something above you, use a strong step stool that has a  grab bar.  Keep electrical cords out of the way.  Do not use floor polish or wax that makes floors slippery. If you must use wax, use non-skid floor wax.  Do not have throw rugs and other things on the floor that can make you trip. What can I do with my stairs?  Do not leave any items on the stairs.  Make sure that there are handrails on both sides of the stairs and use them. Fix handrails that are broken or loose. Make sure that handrails are as long as the stairways.  Check any carpeting to make sure that it is firmly attached to the stairs. Fix any carpet that is loose or worn.  Avoid having throw rugs at the top or bottom of the stairs. If you do have throw rugs, attach them to the floor with carpet tape.  Make sure that you have a light switch at the top of the stairs and the bottom of the stairs. If you do not have them, ask someone to add them for you. What else can I do to help prevent falls?  Wear shoes that:  Do not have high heels.  Have rubber bottoms.  Are comfortable and fit you well.  Are closed at the toe. Do not wear sandals.  If you use a stepladder:  Make sure that it is fully opened. Do not climb a closed stepladder.  Make sure that both sides of the stepladder are locked into place.  Ask someone to hold it for you, if possible.  Clearly mark and make sure that you can see:  Any grab bars or handrails.  First and last steps.  Where the edge of each step is.  Use tools that help you move around (mobility aids) if they are needed. These include:  Canes.  Walkers.  Scooters.  Crutches.  Turn on the lights when you go into a dark area. Replace any light bulbs as soon as they burn out.  Set up your furniture so you have a clear path. Avoid moving your furniture around.  If any of your floors are uneven, fix them.  If there are any pets around you, be aware of where they are.  Review your medicines with your doctor. Some medicines can make  you feel dizzy. This can increase your chance of falling. Ask your doctor what other things that you can do to help prevent falls. This information is not intended to replace advice given to you by your health care provider. Make sure you discuss any questions you have with your health care provider. Document Released: 09/21/2009 Document Revised: 05/02/2016 Document Reviewed: 12/30/2014 Elsevier Interactive Patient Education  2017 [employer].

## 2021-05-09 NOTE — Progress Notes (Signed)
Patient ID: Carrie Page, female   DOB: July 18, 1928, 87 y.o.   MRN: [id number]         Chief Complaint:: wellness exam       HPI:  Carrie Page is a 87 y.o. female here for wellness exam; declines pneumovax, shingrix and covid booster #2; and plans to make her own eye exam f/u soon, o/w up to date with preventive referrals and immunizations                        Also  Pt denies polydipsia, polyuria, or new focal neuro s/s. Pt denies chest pain, increased sob or doe, wheezing, orthopnea, PND, increased LE swelling, palpitations, dizziness or syncope.   Pt denies fever, wt loss, night sweats, loss of appetite, or other constitutional symptoms  Has no no other new complaints Wt Readings from Last 3 Encounters:  05/09/21 123 lb 12.8 oz (56.2 kg)  11/08/20 124 lb (56.2 kg)  05/24/20 123 lb (55.8 kg)   BP Readings from Last 3 Encounters:  05/09/21 140/80  11/08/20 120/80  05/24/20 126/84   Immunization History  Administered Date(s) Administered  . Fluad Quad(high Dose 65+) 11/18/2019, 11/08/2020  . Influenza Split 09/03/2012  . Influenza, High Dose Seasonal PF 11/04/2016, 10/22/2017, 12/23/2018  . Influenza,inj,Quad PF,6+ Mos 09/09/2013, 09/15/2014, 09/07/2015  . Moderna SARS-COV2 Booster Vaccination 12/13/2020  . Moderna Sars-Covid-2 Vaccination 04/19/2020, 05/17/2020  . Pneumococcal Conjugate-13 09/09/2013  . Pneumococcal Polysaccharide-23 01/28/2012  . Tdap 03/19/2016   There are no preventive care reminders to display for this patient.    Past Medical History:  Diagnosis Date  . Allergic rhinitis, cause unspecified 01/28/2012  . Allergy   . Anemia   . Anxiety 01/28/2012  . Arthritis   . Cecal cancer ([place])   . Degenerative joint disease 01/28/2012  . Depression   . History of nonmelanoma skin cancer   . Hypertension   . Impaired glucose tolerance   . Osteoporosis    RT SHOULDER  . PONV (postoperative nausea and vomiting)    Past Surgical History:  Procedure  Laterality Date  . ABDOMINAL HYSTERECTOMY    . APPENDECTOMY    . BACK SURGERY  2010   MICRODCISCECTOMY  . CHOLECYSTECTOMY    . LAPAROSCOPIC PARTIAL COLECTOMY N/A 06/20/2014   Procedure: LAPAROSCOPIC ASSISTED RIGHT PARTIAL COLECTOMY;  Surgeon: Leighton Ruff, MD;  Location: [facility];  Service: General;  Laterality: N/A;  . TONSILLECTOMY      reports that she has never smoked. She has never used smokeless tobacco. She reports that she does not drink alcohol and does not use drugs. family history includes Arthritis in an other family member; Colon cancer in her father; Heart disease in an other family member; Stroke in an other family member. Allergies  Allergen Reactions  . Bactrim [Sulfamethoxazole-Trimethoprim] Itching, Rash and Other (See Comments)    Rash, itching, diaphoretic  . Lovastatin Other (See Comments)    Cognitive slowing and myalgias  . Nitrofurantoin Other (See Comments)    GI upset  . Penicillins Itching  . Pravastatin Other (See Comments)    Myalgias, increased dreams  . Naproxen Rash   Current Outpatient Medications on File Prior to Visit  Medication Sig Dispense Refill  . aspirin EC 81 MG tablet Take 81 mg by mouth daily.    . candesartan (ATACAND) 32 MG tablet Take 1 tablet by mouth once daily 90 tablet 0  . Cholecalciferol (VITAMIN D3) 1000 UNITS CAPS Take 1  capsule by mouth daily.    . Cyanocobalamin (VITAMIN B 12 PO) Take 1 tablet by mouth daily.    . diazepam (VALIUM) 5 MG tablet TAKE ONE TABLET BY MOUTH ONCE DAILY AS NEEDED FOR ANXIETY 30 tablet 2  . MULTIPLE VITAMIN PO Take 1 tablet by mouth daily.     No current facility-administered medications on file prior to visit.        ROS:  All others reviewed and negative.  Objective        PE:  There were no vitals taken for this visit.                Constitutional: Pt appears in NAD               HENT: Head: NCAT.                Right Ear: External ear normal.                 Left Ear: External ear  normal.                Eyes: . Pupils are equal, round, and reactive to light. Conjunctivae and EOM are normal               Nose: without d/c or deformity               Neck: Neck supple. Gross normal ROM               Cardiovascular: Normal rate and regular rhythm.                 Pulmonary/Chest: Effort normal and breath sounds without rales or wheezing.                Abd:  Soft, NT, ND, + BS, no organomegaly               Neurological: Pt is alert. At baseline orientation, motor grossly intact               Skin: Skin is warm. No rashes, no other new lesions, LE edema - none               Psychiatric: Pt behavior is normal without agitation   Micro: none  Cardiac tracings I have personally interpreted today:  none  Pertinent Radiological findings (summarize): none   Lab Results  Component Value Date   WBC 6.1 05/09/2021   HGB 13.4 05/09/2021   HCT 40.2 05/09/2021   PLT 205.0 05/09/2021   GLUCOSE 97 05/09/2021   CHOL 237 (H) 05/09/2021   TRIG 138.0 05/09/2021   HDL 71.60 05/09/2021   LDLDIRECT 124.0 03/04/2013   LDLCALC 138 (H) 05/09/2021   ALT 16 05/09/2021   AST 29 05/09/2021   NA 141 05/09/2021   K 4.7 05/09/2021   CL 102 05/09/2021   CREATININE 0.67 05/09/2021   BUN 25 (H) 05/09/2021   CO2 18 (L) 05/09/2021   TSH 1.63 05/09/2021   HGBA1C 6.2 05/09/2021   Assessment/Plan:  Carrie Page is a 87 y.o. White or Caucasian [1] female with  has a past medical history of Allergic rhinitis, cause unspecified (01/28/2012), Allergy, Anemia, Anxiety (01/28/2012), Arthritis, Cecal cancer ([place]), Degenerative joint disease (01/28/2012), Depression, History of nonmelanoma skin cancer, Hypertension, Impaired glucose tolerance, Osteoporosis, and PONV (postoperative nausea and vomiting).  Preventative health care Age and sex appropriate education and counseling updated with regular exercise and diet Referrals for preventative  services - pt to make own eye xam appt  soon Immunizations addressed - declines pneumovax, shingrix and covid booster #2 Smoking counseling  - none needed Evidence for depression or other mood disorder - none significant Most recent labs reviewed. I have personally reviewed and have noted: 1) the patient's medical and social history 2) The patient's current medications and supplements 3) The patient's height, weight, and BMI have been recorded in the chart   Impaired glucose tolerance Lab Results  Component Value Date   HGBA1C 6.2 05/09/2021   Stable, pt to continue current medical treatment  - diet   Followup: Return in about 1 year (around 05/09/2022).  Cathlean Cower, MD 05/12/2021 10:06 PM [facility]

## 2021-05-09 NOTE — Patient Instructions (Signed)

## 2021-05-09 NOTE — Progress Notes (Signed)
Subjective:   Carrie Page is a 87 y.o. female who presents for Medicare Annual (Subsequent) preventive examination.  Review of Systems    No ROS. Medicare Wellness Visit. Additional risk factors are reflected in social history. Cardiac Risk Factors include: advanced age (>46men, >38 women);family history of premature cardiovascular disease;hypertension Sleep Patterns: No sleep issues, feels rested on waking and sleeps 8 hours nightly. Home Safety/Smoke Alarms: Feels safe in home; uses home alarm. Smoke alarms in place. Living environment: 1-story home; Lives alone; no needs for DME; good support system. Seat Belt Safety/Bike Helmet: Wears seat belt.    Objective:    Today's Vitals   05/09/21 0935 05/09/21 0936  BP: 140/80   Pulse: 79   Temp: 98.2 F (36.8 C)   SpO2: 98%   Weight: 123 lb 12.8 oz (56.2 kg)   Height: 5' (1.524 m)   PainSc:  0-No pain   Body mass index is 24.18 kg/m.  Advanced Directives 05/09/2021 01/04/2019 06/19/2017 12/30/2016 12/25/2015 06/26/2015 05/22/2015  Does Patient Have a Medical Advance Directive? Yes No Yes Yes Yes No Yes  Type of [employer] [place];Living will Healthcare Power of [place]  Does patient want to make changes to medical advance directive? No - Patient declined - - - - - -  Copy of [place] in Chart? No - copy requested - No - copy requested No - copy requested No - copy requested - No - copy requested  Would patient like information on creating a medical advance directive? - No - Patient declined - - - - -  Pre-existing out of facility DNR order (yellow form or pink MOST form) - - - - - - -    Current Medications (verified) Outpatient Encounter Medications as of 05/09/2021  Medication Sig  . aspirin EC 81 MG tablet Take 81 mg by mouth daily.  . candesartan (ATACAND) 32 MG tablet Take 1 tablet by  mouth once daily  . Cholecalciferol (VITAMIN D3) 1000 UNITS CAPS Take 1 capsule by mouth daily.  . Cyanocobalamin (VITAMIN B 12 PO) Take 1 tablet by mouth daily.  . diazepam (VALIUM) 5 MG tablet TAKE ONE TABLET BY MOUTH ONCE DAILY AS NEEDED FOR ANXIETY  . MULTIPLE VITAMIN PO Take 1 tablet by mouth daily.  . [DISCONTINUED] citalopram (CELEXA) 10 MG tablet Take 1 tablet (10 mg total) by mouth daily.   No facility-administered encounter medications on file as of 05/09/2021.    Allergies (verified) Bactrim [sulfamethoxazole-trimethoprim], Lovastatin, Nitrofurantoin, Penicillins, Pravastatin, and Naproxen   History: Past Medical History:  Diagnosis Date  . Allergic rhinitis, cause unspecified 01/28/2012  . Allergy   . Anemia   . Anxiety 01/28/2012  . Arthritis   . Cecal cancer ([place])   . Degenerative joint disease 01/28/2012  . Depression   . History of nonmelanoma skin cancer   . Hypertension   . Impaired glucose tolerance   . Osteoporosis    RT SHOULDER  . PONV (postoperative nausea and vomiting)    Past Surgical History:  Procedure Laterality Date  . ABDOMINAL HYSTERECTOMY    . APPENDECTOMY    . BACK SURGERY  2010   MICRODCISCECTOMY  . CHOLECYSTECTOMY    . LAPAROSCOPIC PARTIAL COLECTOMY N/A 06/20/2014   Procedure: LAPAROSCOPIC ASSISTED RIGHT PARTIAL COLECTOMY;  Surgeon: Leighton Ruff, MD;  Location: [facility];  Service: General;  Laterality: N/A;  . TONSILLECTOMY  Family History  Problem Relation Age of Onset  . Arthritis Other   . Heart disease Other   . Stroke Other   . Colon cancer Father        unsure age   Social History   Socioeconomic History  . Marital status: Widowed    Spouse name: Not on file  . Number of children: Not on file  . Years of education: Not on file  . Highest education level: Not on file  Occupational History  . Occupation: retired  Tobacco Use  . Smoking status: Never Smoker  . Smokeless tobacco: Never Used  Substance and Sexual Activity   . Alcohol use: No    Alcohol/week: 0.0 standard drinks  . Drug use: No  . Sexual activity: Not Currently  Other Topics Concern  . Not on file  Social History Narrative  . Not on file   Social Determinants of Health   Financial Resource Strain: Low Risk   . Difficulty of Paying Living Expenses: Not hard at all  Food Insecurity: No Food Insecurity  . Worried About Charity fundraiser in the Last Year: Never true  . Ran Out of Food in the Last Year: Never true  Transportation Needs: No Transportation Needs  . Lack of Transportation (Medical): No  . Lack of Transportation (Non-Medical): No  Physical Activity: Sufficiently Active  . Days of Exercise per Week: 5 days  . Minutes of Exercise per Session: 30 min  Stress: No Stress Concern Present  . Feeling of Stress : Only a little  Social Connections: Moderately Integrated  . Frequency of Communication with Friends and Family: More than three times a week  . Frequency of Social Gatherings with Friends and Family: Twice a week  . Attends Religious Services: More than 4 times per year  . Active Member of Clubs or Organizations: Yes  . Attends Archivist Meetings: More than 4 times per year  . Marital Status: Widowed    Tobacco Counseling Counseling given: Not Answered   Clinical Intake:  Pre-visit preparation completed: Yes  Pain : No/denies pain Pain Score: 0-No pain     BMI - recorded: 24.18 Nutritional Status: BMI of 19-24  Normal Nutritional Risks: None Diabetes: No  How often do you need to have someone help you when you read instructions, pamphlets, or other written materials from your doctor or pharmacy?: 1 - Never What is the last grade level you completed in school?: 10th grade  Diabetic? no  Interpreter Needed?: No  Information entered by :: Lisette Abu, LPN   Activities of Daily Living In your present state of health, do you have any difficulty performing the following activities:  05/09/2021  Hearing? N  Vision? N  Difficulty concentrating or making decisions? N  Walking or climbing stairs? N  Dressing or bathing? N  Doing errands, shopping? N  Preparing Food and eating ? N  Using the Toilet? N  In the past six months, have you accidently leaked urine? N  Do you have problems with loss of bowel control? N  Managing your Medications? N  Managing your Finances? N  Housekeeping or managing your Housekeeping? N  Some recent data might be hidden    Patient Care Team: Biagio Borg, MD as PCP - General (Internal Medicine)  Indicate any recent Medical Services you may have received from other than [facility] providers in the past year (date may be approximate).     Assessment:   This is a  routine wellness examination for [facility].  Hearing/Vision screen No exam data present  Dietary issues and exercise activities discussed: Current Exercise Habits: The patient has a physically strenuous job, but has no regular exercise apart from work. (very active around the house; no regular exercise), Exercise limited by: None identified  Goals Addressed            This Visit's Progress   . Patient Stated       To maintain my current health status by continuing to eat healthy, stay physically active and socially active.      Depression Screen PHQ 2/9 Scores 05/09/2021 05/24/2020 05/24/2020 05/20/2019 03/26/2018 10/22/2017 06/19/2017  PHQ - 2 Score 0 0 0 1 0 0 2  PHQ- 9 Score - - 0 - - 0 4    Fall Risk Fall Risk  05/09/2021 05/24/2020 05/24/2020 05/20/2019 03/26/2018  Falls in the past year? 0 1 1 1  No  Number falls in past yr: 0 0 - 1 -  Injury with Fall? 0 0 - 1 -  Comment - - - bilat hip pain seen per Dr Nelva Bush -  Risk for fall due to : No Fall Risks - - - -  Follow up Falls evaluation completed - - - -    FALL RISK PREVENTION PERTAINING TO THE HOME:  Any stairs in or around the home? No  If so, are there any without handrails? No  Home free of loose throw rugs in walkways,  pet beds, electrical cords, etc? Yes  Adequate lighting in your home to reduce risk of falls? Yes   ASSISTIVE DEVICES UTILIZED TO PREVENT FALLS:  Life alert? No  Use of a cane, walker or w/c? No  Grab bars in the bathroom? No  Shower chair or bench in shower? No  Elevated toilet seat or a handicapped toilet? No   TIMED UP AND GO:  Was the test performed? No .  Length of time to ambulate 10 feet: 0 sec.   Gait steady and fast without use of assistive device  Cognitive Function: Normal cognitive status assessed by direct observation by this Nurse Health Advisor. No abnormalities found.   MMSE - Mini Mental State Exam 06/19/2017  Orientation to time 5  Orientation to Place 5  Registration 3  Attention/ Calculation 4  Recall 2  Language- name 2 objects 2  Language- repeat 1  Language- follow 3 step command 3  Language- read & follow direction 1  Write a sentence 1  Copy design 1  Total score 28        Immunizations Immunization History  Administered Date(s) Administered  . Fluad Quad(high Dose 65+) 11/18/2019, 11/08/2020  . Influenza Split 09/03/2012  . Influenza, High Dose Seasonal PF 11/04/2016, 10/22/2017, 12/23/2018  . Influenza,inj,Quad PF,6+ Mos 09/09/2013, 09/15/2014, 09/07/2015  . Moderna SARS-COV2 Booster Vaccination 12/13/2020  . Moderna Sars-Covid-2 Vaccination 04/19/2020, 05/17/2020  . Pneumococcal Conjugate-13 09/09/2013  . Pneumococcal Polysaccharide-23 01/28/2012  . Tdap 03/19/2016    TDAP status: Up to date  Flu Vaccine status: Up to date  Pneumococcal vaccine status: Up to date  Covid-19 vaccine status: Completed vaccines  Qualifies for Shingles Vaccine? Yes   Zostavax completed No   Shingrix Completed?: No.    Education has been provided regarding the importance of this vaccine. Patient has been advised to call insurance company to determine out of pocket expense if they have not yet received this vaccine. Advised may also receive vaccine at  local pharmacy or Health Dept. Verbalized  acceptance and understanding.  Screening Tests Health Maintenance  Topic Date Due  . Zoster Vaccines- Shingrix (1 of 2) Never done  . OPHTHALMOLOGY EXAM  10/18/2020  . COVID-19 Vaccine (4 - Booster for Moderna series) 03/13/2021  . FOOT EXAM  05/24/2021  . INFLUENZA VACCINE  07/09/2021  . TETANUS/TDAP  03/19/2026  . DEXA SCAN  Completed  . PNA vac Low Risk Adult  Completed  . HPV VACCINES  Aged Out    Health Maintenance  Health Maintenance Due  Topic Date Due  . Zoster Vaccines- Shingrix (1 of 2) Never done  . OPHTHALMOLOGY EXAM  10/18/2020  . COVID-19 Vaccine (4 - Booster for Moderna series) 03/13/2021    Colorectal cancer screening: No longer required.   Mammogram status: No longer required due to due to age.  Bone Density status: Completed 07/21/2017. Results reflect: Bone density results: OSTEOPENIA. Repeat every 2 years. (completed or no longer recommended)  Lung Cancer Screening: (Low Dose CT Chest recommended if Age 64-80 years, 30 pack-year currently smoking OR have quit w/in 15years.) does not qualify.   Lung Cancer Screening Referral: no  Additional Screening:  Hepatitis C Screening: does not qualify; Completed no  Vision Screening: Recommended annual ophthalmology exams for early detection of glaucoma and other disorders of the eye. Is the patient up to date with their annual eye exam?  Yes  Who is the provider or what is the name of the office in which the patient attends annual eye exams? Martinique DeMarco, OD. If pt is not established with a provider, would they like to be referred to a provider to establish care? No .   Dental Screening: Recommended annual dental exams for proper oral hygiene  Community Resource Referral / Chronic Care Management: CRR required this visit?  No   CCM required this visit?  No      Plan:     I have personally reviewed and noted the following in the patient's chart:   . Medical  and social history . Use of alcohol, tobacco or illicit drugs  . Current medications and supplements including opioid prescriptions.  . Functional ability and status . Nutritional status . Physical activity . Advanced directives . List of other physicians . Hospitalizations, surgeries, and ER visits in previous 12 months . Vitals . Screenings to include cognitive, depression, and falls . Referrals and appointments  In addition, I have reviewed and discussed with patient certain preventive protocols, quality metrics, and best practice recommendations. A written personalized care plan for preventive services as well as general preventive health recommendations were provided to patient.     Sheral Flow, LPN   06/13/7208   Nurse Notes:  Medications reviewed with patient; no opioid use noted.

## 2021-05-11 LAB — BASIC METABOLIC PANEL
BUN: 25 mg/dL — ABNORMAL HIGH (ref 6–23)
CO2: 18 mEq/L — ABNORMAL LOW (ref 19–32)
Calcium: 9.9 mg/dL (ref 8.4–10.5)
Chloride: 102 mEq/L (ref 96–112)
Creatinine, Ser: 0.67 mg/dL (ref 0.40–1.20)
GFR: 76.86 mL/min (ref >60.00)
Glucose, Bld: 97 mg/dL (ref 70–99)
Potassium: 4.7 mEq/L (ref 3.5–5.1)
Sodium: 141 mEq/L (ref 135–145)

## 2021-05-11 LAB — LIPID PANEL
Cholesterol: 237 mg/dL — ABNORMAL HIGH (ref 0–200)
HDL: 71.6 mg/dL (ref >39.00)
LDL Cholesterol: 138 mg/dL — ABNORMAL HIGH (ref 0–99)
NonHDL: 165.81
Total CHOL/HDL Ratio: 3
Triglycerides: 138 mg/dL (ref 0.0–149.0)
VLDL: 27.6 mg/dL (ref 0.0–40.0)

## 2021-05-11 LAB — HEPATIC FUNCTION PANEL
ALT: 16 U/L (ref 0–35)
AST: 29 U/L (ref 0–37)
Albumin: 4.6 g/dL (ref 3.5–5.2)
Alkaline Phosphatase: 83 U/L (ref 39–117)
Bilirubin, Direct: 0.1 mg/dL (ref 0.0–0.3)
Total Bilirubin: 0.5 mg/dL (ref 0.2–1.2)
Total Protein: 7.8 g/dL (ref 6.0–8.3)

## 2021-05-12 ENCOUNTER — Encounter: Payer: Self-pay | Admitting: Internal Medicine

## 2021-05-12 NOTE — Assessment & Plan Note (Signed)
Age and sex appropriate education and counseling updated with regular exercise and diet Referrals for preventative services - pt to make own eye xam appt soon Immunizations addressed - declines pneumovax, shingrix and covid booster #2 Smoking counseling  - none needed Evidence for depression or other mood disorder - none significant Most recent labs reviewed. I have personally reviewed and have noted: 1) the patient's medical and social history 2) The patient's current medications and supplements 3) The patient's height, weight, and BMI have been recorded in the chart

## 2021-05-12 NOTE — Assessment & Plan Note (Signed)
Lab Results  Component Value Date   HGBA1C 6.2 05/09/2021   Stable, pt to continue current medical treatment  - diet

## 2021-05-24 DIAGNOSIS — H524 Presbyopia: Secondary | ICD-10-CM | POA: Diagnosis not present

## 2021-05-24 DIAGNOSIS — H04123 Dry eye syndrome of bilateral lacrimal glands: Secondary | ICD-10-CM | POA: Diagnosis not present

## 2021-05-24 DIAGNOSIS — H35363 Drusen (degenerative) of macula, bilateral: Secondary | ICD-10-CM | POA: Diagnosis not present

## 2021-05-24 DIAGNOSIS — H40013 Open angle with borderline findings, low risk, bilateral: Secondary | ICD-10-CM | POA: Diagnosis not present

## 2021-05-24 DIAGNOSIS — H26493 Other secondary cataract, bilateral: Secondary | ICD-10-CM | POA: Diagnosis not present

## 2021-05-24 DIAGNOSIS — H26492 Other secondary cataract, left eye: Secondary | ICD-10-CM | POA: Diagnosis not present

## 2021-11-08 ENCOUNTER — Ambulatory Visit (INDEPENDENT_AMBULATORY_CARE_PROVIDER_SITE_OTHER): Payer: Medicare Other | Admitting: Internal Medicine

## 2021-11-08 ENCOUNTER — Other Ambulatory Visit: Payer: Self-pay

## 2021-11-08 ENCOUNTER — Encounter: Payer: Self-pay | Admitting: Internal Medicine

## 2021-11-08 VITALS — BP 178/78 | HR 81 | Temp 98.0°F | Ht 60.0 in | Wt 122.0 lb

## 2021-11-08 DIAGNOSIS — Z23 Encounter for immunization: Secondary | ICD-10-CM

## 2021-11-08 DIAGNOSIS — E538 Deficiency of other specified B group vitamins: Secondary | ICD-10-CM

## 2021-11-08 DIAGNOSIS — I10 Essential (primary) hypertension: Secondary | ICD-10-CM

## 2021-11-08 DIAGNOSIS — F419 Anxiety disorder, unspecified: Secondary | ICD-10-CM

## 2021-11-08 DIAGNOSIS — R7302 Impaired glucose tolerance (oral): Secondary | ICD-10-CM | POA: Diagnosis not present

## 2021-11-08 MED ORDER — CITALOPRAM HYDROBROMIDE 10 MG PO TABS: 10.0000 mg | ORAL_TABLET | Freq: Every day | ORAL | 3 refills | Status: DC

## 2021-11-08 MED ORDER — VITAMIN B-12 1000 MCG PO TABS: 1000.0000 ug | ORAL_TABLET | Freq: Every day | ORAL | 3 refills | Status: AC

## 2021-11-08 NOTE — Patient Instructions (Signed)
Please take all new medication as prescribed - the celexa 10 mg per day for anxiety (nerves)  Please also take the OTC Vitamin B12 at 1000 mcg per day (any brand should be fine)  Please continue all other medications as before  Please have the pharmacy call with any other refills you may need.  Please continue your efforts at being more active, low cholesterol diet, and weight control.  Please keep your appointments with your specialists as you may have planned  Please make an Appointment to return in 6 months, or sooner if needed
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
## 2021-11-13 DIAGNOSIS — E538 Deficiency of other specified B group vitamins: Secondary | ICD-10-CM | POA: Insufficient documentation

## 2021-11-13 NOTE — Assessment & Plan Note (Signed)
Lab Results  Component Value Date   VITAMINB12 361 05/09/2021   Stable, cont oral replacement - b12 1000 mcg qd
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
## 2021-11-13 NOTE — Assessment & Plan Note (Signed)
Lab Results  Component Value Date   HGBA1C 6.2 05/09/2021   Stable, pt to continue current medical treatment  - diet, wt control
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
## 2021-11-22 ENCOUNTER — Telehealth: Payer: Self-pay | Admitting: Internal Medicine

## 2021-11-22 MED ORDER — CANDESARTAN CILEXETIL 32 MG PO TABS: 32.0000 mg | ORAL_TABLET | Freq: Every day | ORAL | 3 refills | Status: DC

## 2021-11-22 NOTE — Telephone Encounter (Signed)
1.Medication Requested: candesartan (ATACAND) 32 MG tablet  2. Pharmacy (Name, Street, [place]): [facility] [place].  3. On Med List: yes   4. Last Visit with PCP: 11-08-2021  5. Next visit date with PCP: 05-09-2022   Agent: Please be advised that RX refills may take up to 3 business days. We ask that you follow-up with your pharmacy.

## 2021-12-14 DIAGNOSIS — H35013 Changes in retinal vascular appearance, bilateral: Secondary | ICD-10-CM | POA: Diagnosis not present

## 2021-12-14 DIAGNOSIS — H35363 Drusen (degenerative) of macula, bilateral: Secondary | ICD-10-CM | POA: Diagnosis not present

## 2021-12-14 DIAGNOSIS — H40013 Open angle with borderline findings, low risk, bilateral: Secondary | ICD-10-CM | POA: Diagnosis not present

## 2021-12-14 DIAGNOSIS — H26491 Other secondary cataract, right eye: Secondary | ICD-10-CM | POA: Diagnosis not present

## 2022-05-09 ENCOUNTER — Encounter: Payer: Self-pay | Admitting: Internal Medicine

## 2022-05-09 ENCOUNTER — Ambulatory Visit (INDEPENDENT_AMBULATORY_CARE_PROVIDER_SITE_OTHER): Payer: Medicare Other | Admitting: Internal Medicine

## 2022-05-09 VITALS — BP 160/78 | HR 79 | Temp 98.0°F | Ht 60.0 in | Wt 123.8 lb

## 2022-05-09 DIAGNOSIS — E785 Hyperlipidemia, unspecified: Secondary | ICD-10-CM

## 2022-05-09 DIAGNOSIS — Z0001 Encounter for general adult medical examination with abnormal findings: Secondary | ICD-10-CM

## 2022-05-09 DIAGNOSIS — E538 Deficiency of other specified B group vitamins: Secondary | ICD-10-CM

## 2022-05-09 DIAGNOSIS — I10 Essential (primary) hypertension: Secondary | ICD-10-CM | POA: Diagnosis not present

## 2022-05-09 DIAGNOSIS — M79672 Pain in left foot: Secondary | ICD-10-CM | POA: Diagnosis not present

## 2022-05-09 DIAGNOSIS — E559 Vitamin D deficiency, unspecified: Secondary | ICD-10-CM | POA: Diagnosis not present

## 2022-05-09 DIAGNOSIS — R7302 Impaired glucose tolerance (oral): Secondary | ICD-10-CM | POA: Diagnosis not present

## 2022-05-09 DIAGNOSIS — F419 Anxiety disorder, unspecified: Secondary | ICD-10-CM

## 2022-05-09 LAB — HEPATIC FUNCTION PANEL
ALT: 14 U/L (ref 0–35)
AST: 22 U/L (ref 0–37)
Albumin: 4.5 g/dL (ref 3.5–5.2)
Alkaline Phosphatase: 79 U/L (ref 39–117)
Bilirubin, Direct: 0.1 mg/dL (ref 0.0–0.3)
Total Bilirubin: 0.6 mg/dL (ref 0.2–1.2)
Total Protein: 7.7 g/dL (ref 6.0–8.3)

## 2022-05-09 LAB — BASIC METABOLIC PANEL
BUN: 22 mg/dL (ref 6–23)
CO2: 28 mEq/L (ref 19–32)
Calcium: 10 mg/dL (ref 8.4–10.5)
Chloride: 101 mEq/L (ref 96–112)
Creatinine, Ser: 0.64 mg/dL (ref 0.40–1.20)
GFR: 77.18 mL/min (ref >60.00)
Glucose, Bld: 107 mg/dL — ABNORMAL HIGH (ref 70–99)
Potassium: 4.3 mEq/L (ref 3.5–5.1)
Sodium: 138 mEq/L (ref 135–145)

## 2022-05-09 LAB — URINALYSIS, ROUTINE W REFLEX MICROSCOPIC
Bilirubin Urine: NEGATIVE
Ketones, ur: NEGATIVE
Nitrite: NEGATIVE
Specific Gravity, Urine: 1.01 (ref 1.000–1.030)
Total Protein, Urine: NEGATIVE
Urine Glucose: NEGATIVE
Urobilinogen, UA: 0.2 (ref 0.0–1.0)
pH: 6 (ref 5.0–8.0)

## 2022-05-09 LAB — LIPID PANEL
Cholesterol: 227 mg/dL — ABNORMAL HIGH (ref 0–200)
HDL: 67.7 mg/dL (ref >39.00)
LDL Cholesterol: 131 mg/dL — ABNORMAL HIGH (ref 0–99)
NonHDL: 158.94
Total CHOL/HDL Ratio: 3
Triglycerides: 141 mg/dL (ref 0.0–149.0)
VLDL: 28.2 mg/dL (ref 0.0–40.0)

## 2022-05-09 LAB — CBC WITH DIFFERENTIAL/PLATELET
Basophils Absolute: 0.1 10*3/uL (ref 0.0–0.1)
Basophils Relative: 0.8 % (ref 0.0–3.0)
Eosinophils Absolute: 0.3 10*3/uL (ref 0.0–0.7)
Eosinophils Relative: 4.6 % (ref 0.0–5.0)
HCT: 40.1 % (ref 36.0–46.0)
Hemoglobin: 13.2 g/dL (ref 12.0–15.0)
Lymphocytes Relative: 39.6 % (ref 12.0–46.0)
Lymphs Abs: 2.7 10*3/uL (ref 0.7–4.0)
MCHC: 33 g/dL (ref 30.0–36.0)
MCV: 88.7 fl (ref 78.0–100.0)
Monocytes Absolute: 0.4 10*3/uL (ref 0.1–1.0)
Monocytes Relative: 5.6 % (ref 3.0–12.0)
Neutro Abs: 3.3 10*3/uL (ref 1.4–7.7)
Neutrophils Relative %: 49.4 % (ref 43.0–77.0)
Platelets: 216 10*3/uL (ref 150.0–400.0)
RBC: 4.52 Mil/uL (ref 3.87–5.11)
RDW: 13.7 % (ref 11.5–15.5)
WBC: 6.7 10*3/uL (ref 4.0–10.5)

## 2022-05-09 LAB — VITAMIN D 25 HYDROXY (VIT D DEFICIENCY, FRACTURES): VITD: 36.04 ng/mL (ref 30.00–100.00)

## 2022-05-09 LAB — TSH: TSH: 2.31 u[IU]/mL (ref 0.35–5.50)

## 2022-05-09 LAB — VITAMIN B12: Vitamin B-12: 880 pg/mL (ref 211–911)

## 2022-05-09 LAB — HEMOGLOBIN A1C: Hgb A1c MFr Bld: 6.1 % (ref 4.6–6.5)

## 2022-05-09 MED ORDER — DIAZEPAM 5 MG PO TABS: ORAL_TABLET | ORAL | 2 refills | Status: AC

## 2022-05-09 MED ORDER — CANDESARTAN CILEXETIL 32 MG PO TABS: 32.0000 mg | ORAL_TABLET | Freq: Every day | ORAL | 3 refills | Status: DC

## 2022-05-09 NOTE — Assessment & Plan Note (Signed)
BP Readings from Last 3 Encounters:  05/09/22 (!) 160/78  11/08/21 (!) 178/78  05/09/21 140/80   Uncontrolled here today, pt states controlled at home, pt to continue medical treatment atacand 32 mg, declines change in tx

## 2022-05-09 NOTE — Progress Notes (Signed)
Patient ID: Carrie Page, female   DOB: 03-04-1928, 88 y.o.   MRN: [id number]         Chief Complaint:: wellness exam and anxiety, htn, hld, left foot pain       HPI:  Carrie Page is a 88 y.o. female here for wellness exam; declines covid bosoter, shingrix, o/w up to date                        Also has some off balance but no fall in 2 yrs.  Denies worsening depressive symptoms, suicidal ideation, or panic; has ongoing anxiety, never took the celexa after she read the side effects.  Only takes the valium every few days.  Bp at home average 122/70 - ok at home.  Trying to follow lower chol diet, statin intolerant, does not want zetia or other for now.  Pt denies chest pain, increased sob or doe, wheezing, orthopnea, PND, increased LE swelling, palpitations, dizziness or syncope.   Pt denies polydipsia, polyuria, or new focal neuro s/s.    Pt denies fever, wt loss, night sweats, loss of appetite, or other constitutional symptoms  Does have 1 mo worsening left mid foot pain and intermittent swelling, asks to see podaitry   Wt Readings from Last 3 Encounters:  05/09/22 123 lb 12.8 oz (56.2 kg)  11/08/21 122 lb (55.3 kg)  05/09/21 123 lb 12.8 oz (56.2 kg)   BP Readings from Last 3 Encounters:  05/09/22 (!) 160/78  11/08/21 (!) 178/78  05/09/21 140/80   Immunization History  Administered Date(s) Administered   Fluad Quad(high Dose 65+) 11/18/2019, 11/08/2020, 11/08/2021   Influenza Split 09/03/2012   Influenza, High Dose Seasonal PF 11/04/2016, 10/22/2017, 12/23/2018   Influenza,inj,Quad PF,6+ Mos 09/09/2013, 09/15/2014, 09/07/2015   Moderna SARS-COV2 Booster Vaccination 12/13/2020   Moderna Sars-Covid-2 Vaccination 04/19/2020, 05/17/2020   Pneumococcal Conjugate-13 09/09/2013   Pneumococcal Polysaccharide-23 01/28/2012   Tdap 03/19/2016   There are no preventive care reminders to display for this patient.     Past Medical History:  Diagnosis Date   Allergic rhinitis, cause  unspecified 01/28/2012   Allergy    Anemia    Anxiety 01/28/2012   Arthritis    Cecal cancer ([place])    Degenerative joint disease 01/28/2012   Depression    History of nonmelanoma skin cancer    Hypertension    Impaired glucose tolerance    Osteoporosis    RT SHOULDER   PONV (postoperative nausea and vomiting)    Past Surgical History:  Procedure Laterality Date   ABDOMINAL HYSTERECTOMY     APPENDECTOMY     BACK SURGERY  2010   MICRODCISCECTOMY   CHOLECYSTECTOMY     LAPAROSCOPIC PARTIAL COLECTOMY N/A 06/20/2014   Procedure: LAPAROSCOPIC ASSISTED RIGHT PARTIAL COLECTOMY;  Surgeon: Leighton Ruff, MD;  Location: [facility];  Service: General;  Laterality: N/A;   TONSILLECTOMY      reports that she has never smoked. She has never used smokeless tobacco. She reports that she does not drink alcohol and does not use drugs. family history includes Arthritis in an other family member; Colon cancer in her father; Heart disease in an other family member; Stroke in an other family member. Allergies  Allergen Reactions   Bactrim [Sulfamethoxazole-Trimethoprim] Itching, Rash and Other (See Comments)    Rash, itching, diaphoretic   Lovastatin Other (See Comments)    Cognitive slowing and myalgias   Nitrofurantoin Other (See Comments)    GI upset  Penicillins Itching   Pravastatin Other (See Comments)    Myalgias, increased dreams   Naproxen Rash   Current Outpatient Medications on File Prior to Visit  Medication Sig Dispense Refill   aspirin EC 81 MG tablet Take 81 mg by mouth daily.     Cholecalciferol (VITAMIN D3) 1000 UNITS CAPS Take 1 capsule by mouth daily.     MULTIPLE VITAMIN PO Take 1 tablet by mouth daily.     vitamin B-12 (CYANOCOBALAMIN) 1000 MCG tablet Take 1 tablet (1,000 mcg total) by mouth daily. 90 tablet 3   No current facility-administered medications on file prior to visit.        ROS:  All others reviewed and negative.  Objective        PE:  BP (!) 160/78 (BP  Location: Left Arm, Patient Position: Sitting, Cuff Size: Large)   Pulse 79   Temp 98 F (36.7 C) (Oral)   Ht 5' (1.524 m)   Wt 123 lb 12.8 oz (56.2 kg)   SpO2 98%   BMI 24.18 kg/m                 Constitutional: Pt appears in NAD               HENT: Head: NCAT.                Right Ear: External ear normal.                 Left Ear: External ear normal.                Eyes: . Pupils are equal, round, and reactive to light. Conjunctivae and EOM are normal               Nose: without d/c or deformity               Neck: Neck supple. Gross normal ROM               Cardiovascular: Normal rate and regular rhythm.                 Pulmonary/Chest: Effort normal and breath sounds without rales or wheezing.                Abd:  Soft, NT, ND, + BS, no organomegaly               Neurological: Pt is alert. At baseline orientation, motor grossly intact               Skin: Skin is warm. No rashes, no other new lesions, LE edema - none               Psychiatric: Pt behavior is normal without agitation , mild nervous  Micro: none  Cardiac tracings I have personally interpreted today:  none  Pertinent Radiological findings (summarize): none   Lab Results  Component Value Date   WBC 6.1 05/09/2021   HGB 13.4 05/09/2021   HCT 40.2 05/09/2021   PLT 205.0 05/09/2021   GLUCOSE 97 05/09/2021   CHOL 237 (H) 05/09/2021   TRIG 138.0 05/09/2021   HDL 71.60 05/09/2021   LDLDIRECT 124.0 03/04/2013   LDLCALC 138 (H) 05/09/2021   ALT 16 05/09/2021   AST 29 05/09/2021   NA 141 05/09/2021   K 4.7 05/09/2021   CL 102 05/09/2021   CREATININE 0.67 05/09/2021   BUN 25 (H) 05/09/2021   CO2 18 (L) 05/09/2021  TSH 1.63 05/09/2021   HGBA1C 6.2 05/09/2021   Assessment/Plan:  Carrie Page is a 88 y.o. White or Caucasian [1] female with  has a past medical history of Allergic rhinitis, cause unspecified (01/28/2012), Allergy, Anemia, Anxiety (01/28/2012), Arthritis, Cecal cancer ([place]), Degenerative  joint disease (01/28/2012), Depression, History of nonmelanoma skin cancer, Hypertension, Impaired glucose tolerance, Osteoporosis, and PONV (postoperative nausea and vomiting).  Encounter for well adult exam with abnormal findings Age and sex appropriate education and counseling updated with regular exercise and diet Referrals for preventative services - none needed Immunizations addressed - declines covid booster, shingrix Smoking counseling  - none needed Evidence for depression or other mood disorder - none significant Most recent labs reviewed. I have personally reviewed and have noted: 1) the patient's medical and social history 2) The patient's current medications and supplements 3) The patient's height, weight, and BMI have been recorded in the chart   Impaired glucose tolerance Lab Results  Component Value Date   HGBA1C 6.2 05/09/2021   Stable, pt to continue current medical treatment  - diet, wt control   Hypertension BP Readings from Last 3 Encounters:  05/09/22 (!) 160/78  11/08/21 (!) 178/78  05/09/21 140/80   Uncontrolled here today, pt states controlled at home, pt to continue medical treatment atacand 32 mg, declines change in tx   Hyperlipidemia Lab Results  Component Value Date   LDLCALC 138 (H) 05/09/2021   Uncontrolled, pt to continue current low chol diet, declines zetia for now, goal ldl < 100   B12 deficiency Lab Results  Component Value Date   VITAMINB12 361 05/09/2021   Stable, cont oral replacement - b12 1000 mcg qd   Anxiety Mild uncontrolled, declines celexa for now, cont valium 5 mg prn   Left foot pain Etiology unclear, for podiatry referral, to f/u any worsening symptoms or concerns  Followup: Return in about 1 year (around 05/10/2023).  Cathlean Cower, MD 05/09/2022 12:52 PM [facility]

## 2022-05-09 NOTE — Assessment & Plan Note (Signed)
Lab Results  Component Value Date   VITAMINB12 361 05/09/2021   Stable, cont oral replacement - b12 1000 mcg qd

## 2022-05-09 NOTE — Assessment & Plan Note (Signed)
Lab Results  Component Value Date   LDLCALC 138 (H) 05/09/2021   Uncontrolled, pt to continue current low chol diet, declines zetia for now, goal ldl < 100

## 2022-05-09 NOTE — Assessment & Plan Note (Signed)

## 2022-05-09 NOTE — Assessment & Plan Note (Signed)
Lab Results  Component Value Date   HGBA1C 6.2 05/09/2021   Stable, pt to continue current medical treatment  - diet, wt control

## 2022-05-09 NOTE — Assessment & Plan Note (Signed)
Mild uncontrolled, declines celexa for now, cont valium 5 mg prn

## 2022-05-09 NOTE — Assessment & Plan Note (Signed)
Etiology unclear, for podiatry referral, to f/u any worsening symptoms or concerns

## 2022-05-09 NOTE — Patient Instructions (Signed)
Please continue all other medications as before, and refills have been done if requested.  Please have the pharmacy call with any other refills you may need.  Please continue your efforts at being more active, low cholesterol diet, and weight control.  You are otherwise up to date with prevention measures today.  Please keep your appointments with your specialists as you may have planned  You will be contacted regarding the referral for: Dr Paulla Dolly  - [facility]  Please go to the LAB at the blood drawing area for the tests to be done  You will be contacted by phone if any changes need to be made immediately.  Otherwise, you will receive a letter about your results with an explanation, but please check with MyChart first.  Please remember to sign up for MyChart if you have not done so, as this will be important to you in the future with finding out test results, communicating by private email, and scheduling acute appointments online when needed.  Please make an Appointment to return for your 1 year visit, or sooner if needed

## 2022-05-14 ENCOUNTER — Ambulatory Visit (INDEPENDENT_AMBULATORY_CARE_PROVIDER_SITE_OTHER): Payer: Medicare Other

## 2022-05-14 DIAGNOSIS — Z0000 Encounter for general adult medical examination without abnormal findings: Secondary | ICD-10-CM | POA: Diagnosis not present

## 2022-05-14 NOTE — Patient Instructions (Signed)
Carrie Page , Thank you for taking time to come for your Medicare Wellness Visit. I appreciate your ongoing commitment to your health goals. Please review the following plan we discussed and let me know if I can assist you in the future.   Screening recommendations/referrals: Colonoscopy: Not a candidate for screening due to age 88: Not a candidate for screening due to age Bone Density: Not a candidate for screening due to age Recommended yearly ophthalmology/optometry visit for glaucoma screening and checkup Recommended yearly dental visit for hygiene and checkup  Vaccinations: Influenza vaccine: 11/08/2021 Pneumococcal vaccine: 01/28/2012, 09/09/2013 Tdap vaccine: 03/19/2016; due every 10 years Shingles vaccine: never done   Covid-19: 04/19/2020, 05/17/2020, 12/13/2020  Advanced directives: Yes; Please bring a copy of your health care power of attorney and living will to the office at your convenience.  Conditions/risks identified: Yes; Staying independent and active is the key!  Next appointment: Please schedule your next Medicare Wellness Visit with your Nurse Health Advisor in 1 year by calling [phone].   Preventive Care 50 Years and Older, Female Preventive care refers to lifestyle choices and visits with your health care provider that can promote health and wellness. What does preventive care include? A yearly physical exam. This is also called an annual well check. Dental exams once or twice a year. Routine eye exams. Ask your health care provider how often you should have your eyes checked. Personal lifestyle choices, including: Daily care of your teeth and gums. Regular physical activity. Eating a healthy diet. Avoiding tobacco and drug use. Limiting alcohol use. Practicing safe sex. Taking low-dose aspirin every day. Taking vitamin and mineral supplements as recommended by your health care provider. What happens during an annual well check? The services and  screenings done by your health care provider during your annual well check will depend on your age, overall health, lifestyle risk factors, and family history of disease. Counseling  Your health care provider may ask you questions about your: Alcohol use. Tobacco use. Drug use. Emotional well-being. Home and relationship well-being. Sexual activity. Eating habits. History of falls. Memory and ability to understand (cognition). Work and work Statistician. Reproductive health. Screening  You may have the following tests or measurements: Height, weight, and BMI. Blood pressure. Lipid and cholesterol levels. These may be checked every 5 years, or more frequently if you are over 2 years old. Skin check. Lung cancer screening. You may have this screening every year starting at age 28 if you have a 30-pack-year history of smoking and currently smoke or have quit within the past 15 years. Fecal occult blood test (FOBT) of the stool. You may have this test every year starting at age 33. Flexible sigmoidoscopy or colonoscopy. You may have a sigmoidoscopy every 5 years or a colonoscopy every 10 years starting at age 77. Hepatitis C blood test. Hepatitis B blood test. Sexually transmitted disease (STD) testing. Diabetes screening. This is done by checking your blood sugar (glucose) after you have not eaten for a while (fasting). You may have this done every 1-3 years. Bone density scan. This is done to screen for osteoporosis. You may have this done starting at age 7. Mammogram. This may be done every 1-2 years. Talk to your health care provider about how often you should have regular mammograms. Talk with your health care provider about your test results, treatment options, and if necessary, the need for more tests. Vaccines  Your health care provider may recommend certain vaccines, such as: Influenza vaccine. This is  recommended every year. Tetanus, diphtheria, and acellular pertussis (Tdap,  Td) vaccine. You may need a Td booster every 10 years. Zoster vaccine. You may need this after age 31. Pneumococcal 13-valent conjugate (PCV13) vaccine. One dose is recommended after age 31. Pneumococcal polysaccharide (PPSV23) vaccine. One dose is recommended after age 89. Talk to your health care provider about which screenings and vaccines you need and how often you need them. This information is not intended to replace advice given to you by your health care provider. Make sure you discuss any questions you have with your health care provider. Document Released: 12/22/2015 Document Revised: 08/14/2016 Document Reviewed: 09/26/2015 Elsevier Interactive Patient Education  2017 [place] Prevention in the Home Falls can cause injuries. They can happen to people of all ages. There are many things you can do to make your home safe and to help prevent falls. What can I do on the outside of my home? Regularly fix the edges of walkways and driveways and fix any cracks. Remove anything that might make you trip as you walk through a door, such as a raised step or threshold. Trim any bushes or trees on the path to your home. Use bright outdoor lighting. Clear any walking paths of anything that might make someone trip, such as rocks or tools. Regularly check to see if handrails are loose or broken. Make sure that both sides of any steps have handrails. Any raised decks and porches should have guardrails on the edges. Have any leaves, snow, or ice cleared regularly. Use sand or salt on walking paths during winter. Clean up any spills in your garage right away. This includes oil or grease spills. What can I do in the bathroom? Use night lights. Install grab bars by the toilet and in the tub and shower. Do not use towel bars as grab bars. Use non-skid mats or decals in the tub or shower. If you need to sit down in the shower, use a plastic, non-slip stool. Keep the floor dry. Clean up any  water that spills on the floor as soon as it happens. Remove soap buildup in the tub or shower regularly. Attach bath mats securely with double-sided non-slip rug tape. Do not have throw rugs and other things on the floor that can make you trip. What can I do in the bedroom? Use night lights. Make sure that you have a light by your bed that is easy to reach. Do not use any sheets or blankets that are too big for your bed. They should not hang down onto the floor. Have a firm chair that has side arms. You can use this for support while you get dressed. Do not have throw rugs and other things on the floor that can make you trip. What can I do in the kitchen? Clean up any spills right away. Avoid walking on wet floors. Keep items that you use a lot in easy-to-reach places. If you need to reach something above you, use a strong step stool that has a grab bar. Keep electrical cords out of the way. Do not use floor polish or wax that makes floors slippery. If you must use wax, use non-skid floor wax. Do not have throw rugs and other things on the floor that can make you trip. What can I do with my stairs? Do not leave any items on the stairs. Make sure that there are handrails on both sides of the stairs and use them. Fix handrails that are  broken or loose. Make sure that handrails are as long as the stairways. Check any carpeting to make sure that it is firmly attached to the stairs. Fix any carpet that is loose or worn. Avoid having throw rugs at the top or bottom of the stairs. If you do have throw rugs, attach them to the floor with carpet tape. Make sure that you have a light switch at the top of the stairs and the bottom of the stairs. If you do not have them, ask someone to add them for you. What else can I do to help prevent falls? Wear shoes that: Do not have high heels. Have rubber bottoms. Are comfortable and fit you well. Are closed at the toe. Do not wear sandals. If you use a  stepladder: Make sure that it is fully opened. Do not climb a closed stepladder. Make sure that both sides of the stepladder are locked into place. Ask someone to hold it for you, if possible. Clearly mark and make sure that you can see: Any grab bars or handrails. First and last steps. Where the edge of each step is. Use tools that help you move around (mobility aids) if they are needed. These include: Canes. Walkers. Scooters. Crutches. Turn on the lights when you go into a dark area. Replace any light bulbs as soon as they burn out. Set up your furniture so you have a clear path. Avoid moving your furniture around. If any of your floors are uneven, fix them. If there are any pets around you, be aware of where they are. Review your medicines with your doctor. Some medicines can make you feel dizzy. This can increase your chance of falling. Ask your doctor what other things that you can do to help prevent falls. This information is not intended to replace advice given to you by your health care provider. Make sure you discuss any questions you have with your health care provider. Document Released: 09/21/2009 Document Revised: 05/02/2016 Document Reviewed: 12/30/2014 Elsevier Interactive Patient Education  2017 [employer].

## 2022-05-14 NOTE — Progress Notes (Signed)
I connected with Carrie Page today by telephone and verified that I am speaking with the correct person using two identifiers. Location patient: home Location provider: work Persons participating in the virtual visit: patient, provider.   I discussed the limitations, risks, security and privacy concerns of performing an evaluation and management service by telephone and the availability of in person appointments. I also discussed with the patient that there may be a patient responsible charge related to this service. The patient expressed understanding and verbally consented to this telephonic visit.    Interactive audio and video telecommunications were attempted between this provider and patient, however failed, due to patient having technical difficulties OR patient did not have access to video capability.  We continued and completed visit with audio only.  Some vital signs may be absent or patient reported.   Time Spent with patient on telephone encounter: 30 minutes  Subjective:   Carrie Page is a 88 y.o. female who presents for Medicare Annual (Subsequent) preventive examination.  Review of Systems     Cardiac Risk Factors include: advanced age (>35mn, >>18women);family history of premature cardiovascular disease;hypertension     Objective:    There were no vitals filed for this visit. There is no height or weight on file to calculate BMI.     05/14/2022   12:34 PM 05/09/2021    9:49 AM 01/04/2019   12:18 PM 06/19/2017    2:26 PM 12/30/2016   12:34 PM 12/25/2015   11:22 AM 06/26/2015    1:32 PM  Advanced Directives  Does Patient Have a Medical Advance Directive? Yes Yes No Yes Yes Yes No  Type of AIndustrial/product designerof AFrontier Oil CorporationPower of AWaite [place]Living will Healthcare Power of [place]  Does patient want to make changes to medical advance directive?  No - Patient declined       Copy of  HMendotain Chart? No - copy requested No - copy requested  No - copy requested No - copy requested No - copy requested   Would patient like information on creating a medical advance directive?   No - Patient declined        Current Medications (verified) Outpatient Encounter Medications as of 05/14/2022  Medication Sig   aspirin EC 81 MG tablet Take 81 mg by mouth daily.   candesartan (ATACAND) 32 MG tablet Take 1 tablet (32 mg total) by mouth daily.   Cholecalciferol (VITAMIN D3) 1000 UNITS CAPS Take 1 capsule by mouth daily.   diazepam (VALIUM) 5 MG tablet TAKE ONE TABLET BY MOUTH ONCE DAILY AS NEEDED FOR ANXIETY   MULTIPLE VITAMIN PO Take 1 tablet by mouth daily.   vitamin B-12 (CYANOCOBALAMIN) 1000 MCG tablet Take 1 tablet (1,000 mcg total) by mouth daily.   No facility-administered encounter medications on file as of 05/14/2022.    Allergies (verified) Bactrim [sulfamethoxazole-trimethoprim], Lovastatin, Nitrofurantoin, Penicillins, Pravastatin, and Naproxen   History: Past Medical History:  Diagnosis Date   Allergic rhinitis, cause unspecified 01/28/2012   Allergy    Anemia    Anxiety 01/28/2012   Arthritis    Cecal cancer ([place]    Degenerative joint disease 01/28/2012   Depression    History of nonmelanoma skin cancer    Hypertension    Impaired glucose tolerance    Osteoporosis    RT SHOULDER   PONV (postoperative nausea and vomiting)    Past Surgical History:  Procedure Laterality Date   ABDOMINAL HYSTERECTOMY     APPENDECTOMY     BACK SURGERY  2010   MICRODCISCECTOMY   CHOLECYSTECTOMY     LAPAROSCOPIC PARTIAL COLECTOMY N/A 06/20/2014   Procedure: LAPAROSCOPIC ASSISTED RIGHT PARTIAL COLECTOMY;  Surgeon: Leighton Ruff, MD;  Location: [facility];  Service: General;  Laterality: N/A;   TONSILLECTOMY     Family History  Problem Relation Age of Onset   Arthritis Other    Heart disease Other    Stroke Other    Colon cancer Father        unsure age    Social History   Socioeconomic History   Marital status: Widowed    Spouse name: Not on file   Number of children: Not on file   Years of education: Not on file   Highest education level: Not on file  Occupational History   Occupation: retired  Tobacco Use   Smoking status: Never   Smokeless tobacco: Never  Substance and Sexual Activity   Alcohol use: No    Alcohol/week: 0.0 standard drinks   Drug use: No   Sexual activity: Not Currently  Other Topics Concern   Not on file  Social History Narrative   Not on file   Social Determinants of Health   Financial Resource Strain: Low Risk    Difficulty of Paying Living Expenses: Not hard at all  Food Insecurity: No Food Insecurity   Worried About Charity fundraiser in the Last Year: Never true   [place] in the Last Year: Never true  Transportation Needs: No Transportation Needs   Lack of Transportation (Medical): No   Lack of Transportation (Non-Medical): No  Physical Activity: Sufficiently Active   Days of Exercise per Week: 5 days   Minutes of Exercise per Session: 30 min  Stress: No Stress Concern Present   Feeling of Stress : Not at all  Social Connections: Moderately Integrated   Frequency of Communication with Friends and Family: More than three times a week   Frequency of Social Gatherings with Friends and Family: Twice a week   Attends Religious Services: More than 4 times per year   Active Member of [employer] or Organizations: Yes   Attends Archivist Meetings: More than 4 times per year   Marital Status: Widowed    Tobacco Counseling Counseling given: Not Answered   Clinical Intake:  Pre-visit preparation completed: Yes  Pain : No/denies pain     BMI - recorded: 24.18 Nutritional Risks: None Diabetes: No  How often do you need to have someone help you when you read instructions, pamphlets, or other written materials from your doctor or pharmacy?: 1 - Never What is the last grade  level you completed in school?: 10th grade  Diabetic? no  Interpreter Needed?: No  Information entered by :: Lisette Abu, LPN.   Activities of Daily Living    05/14/2022   12:43 PM  In your present state of health, do you have any difficulty performing the following activities:  Hearing? 0  Vision? 0  Difficulty concentrating or making decisions? 0  Walking or climbing stairs? 0  Dressing or bathing? 0  Doing errands, shopping? 0  Preparing Food and eating ? N  Using the Toilet? N  In the past six months, have you accidently leaked urine? Y  Do you have problems with loss of bowel control? N  Managing your Medications? N  Managing your Finances? N  Housekeeping or  managing your Housekeeping? N    Patient Care Team: Biagio Borg, MD as PCP - General (Internal Medicine)  Indicate any recent Medical Services you may have received from other than [facility] providers in the past year (date may be approximate).     Assessment:   This is a routine wellness examination for Dashayla.  Hearing/Vision screen No results found.  Dietary issues and exercise activities discussed: Current Exercise Habits: Home exercise routine, Type of exercise: walking;Other - see comments (house work & yard work), Time (Minutes): 30, Frequency (Times/Week): 5, Weekly Exercise (Minutes/Week): 150, Intensity: Mild   Goals Addressed             This Visit's Progress    My goal is to continue to take care of myself.        Depression Screen    05/14/2022   12:39 PM 05/09/2022   11:36 AM 11/08/2021   10:46 AM 05/09/2021   10:13 AM 05/24/2020   12:07 PM 05/24/2020   11:24 AM 05/20/2019    1:58 PM  PHQ 2/9 Scores  PHQ - 2 Score 1 1 0 0 0 0 1  PHQ- 9 Score      0     Fall Risk    05/14/2022   12:36 PM 05/09/2022   11:37 AM 05/09/2022   11:36 AM 11/08/2021   10:46 AM 05/09/2021   10:15 AM  Fall Risk   Falls in the past year? 0 0 0 0 0  Number falls in past yr: 0 0 0 0 0  Injury with Fall? 0 0 0 0 0   Risk for fall due to : No Fall Risks No Fall Risks   No Fall Risks  Follow up Falls evaluation completed Falls evaluation completed   Falls evaluation completed    FALL RISK PREVENTION PERTAINING TO THE HOME:  Any stairs in or around the home? Yes  If so, are there any without handrails? No  Home free of loose throw rugs in walkways, pet beds, electrical cords, etc? Yes  Adequate lighting in your home to reduce risk of falls? Yes   ASSISTIVE DEVICES UTILIZED TO PREVENT FALLS:  Life alert? No  Use of a cane, walker or w/c? No  Grab bars in the bathroom? No  Shower chair or bench in shower? No  Elevated toilet seat or a handicapped toilet? No   TIMED UP AND GO:  Was the test performed? No .  Length of time to ambulate 10 feet: n/a sec.   Appearance of gait: Patient not evaluated for gait during this visit.  Cognitive Function:    06/19/2017    2:29 PM  MMSE - Mini Mental State Exam  Orientation to time 5  Orientation to Place 5  Registration 3  Attention/ Calculation 4  Recall 2  Language- name 2 objects 2  Language- repeat 1  Language- follow 3 step command 3  Language- read & follow direction 1  Write a sentence 1  Copy design 1  Total score 28        05/14/2022   12:44 PM  6CIT Screen  What Year? 0 points  What month? 0 points  What time? 0 points  Count back from 20 0 points  Months in reverse 0 points  Repeat phrase 0 points  Total Score 0 points    Immunizations Immunization History  Administered Date(s) Administered   Fluad Quad(high Dose 65+) 11/18/2019, 11/08/2020, 11/08/2021   Influenza Split 09/03/2012  Influenza, High Dose Seasonal PF 11/04/2016, 10/22/2017, 12/23/2018   Influenza,inj,Quad PF,6+ Mos 09/09/2013, 09/15/2014, 09/07/2015   Moderna SARS-COV2 Booster Vaccination 12/13/2020   Moderna Sars-Covid-2 Vaccination 04/19/2020, 05/17/2020   Pneumococcal Conjugate-13 09/09/2013   Pneumococcal Polysaccharide-23 01/28/2012   Tdap  03/19/2016    TDAP status: Up to date  Flu Vaccine status: Up to date  Pneumococcal vaccine status: Up to date  Covid-19 vaccine status: Completed vaccines  Qualifies for Shingles Vaccine? Yes   Zostavax completed No   Shingrix Completed?: No.    Education has been provided regarding the importance of this vaccine. Patient has been advised to call insurance company to determine out of pocket expense if they have not yet received this vaccine. Advised may also receive vaccine at local pharmacy or Health Dept. Verbalized acceptance and understanding.  Screening Tests Health Maintenance  Topic Date Due   COVID-19 Vaccine (3 - Moderna risk series) 05/25/2022 (Originally 01/10/2021)   Zoster Vaccines- Shingrix (1 of 2) 08/09/2022 (Originally 10/12/1947)   INFLUENZA VACCINE  07/09/2022   OPHTHALMOLOGY EXAM  12/24/2022   FOOT EXAM  05/10/2023   TETANUS/TDAP  03/19/2026   Pneumonia Vaccine 45+ Years old  Completed   DEXA SCAN  Completed   HPV VACCINES  Aged Out    Health Maintenance  There are no preventive care reminders to display for this patient.  Colorectal cancer screening: No longer required.   Mammogram status: No longer required due to age.  Bone Density status: Completed 07/21/2017. Results reflect: Bone density results: OSTEOPENIA. Repeat every 0 years. (No longer recommended)  Lung Cancer Screening: (Low Dose CT Chest recommended if Age 63-80 years, 30 pack-year currently smoking OR have quit w/in 15years.) does not qualify.   Lung Cancer Screening Referral: no  Additional Screening:  Hepatitis C Screening: does not qualify; Completed no  Vision Screening: Recommended annual ophthalmology exams for early detection of glaucoma and other disorders of the eye. Is the patient up to date with their annual eye exam?  Yes  Who is the provider or what is the name of the office in which the patient attends annual eye exams? Monna Fam, MD. If pt is not established with a  provider, would they like to be referred to a provider to establish care? No .   Dental Screening: Recommended annual dental exams for proper oral hygiene  Community Resource Referral / Chronic Care Management: CRR required this visit?  No   CCM required this visit?  No      Plan:     I have personally reviewed and noted the following in the patient's chart:   Medical and social history Use of alcohol, tobacco or illicit drugs  Current medications and supplements including opioid prescriptions.  Functional ability and status Nutritional status Physical activity Advanced directives List of other physicians Hospitalizations, surgeries, and ER visits in previous 12 months Vitals Screenings to include cognitive, depression, and falls Referrals and appointments  In addition, I have reviewed and discussed with patient certain preventive protocols, quality metrics, and best practice recommendations. A written personalized care plan for preventive services as well as general preventive health recommendations were provided to patient.     Sheral Flow, LPN   1/0/6269   Nurse Notes:  There were no vitals filed for this visit. There is no height or weight on file to calculate BMI. Patient stated that she has no issues with gait or balance; does not use any assistive devices. Medications reviewed with patient; no opioid use  noted.

## 2022-05-22 ENCOUNTER — Encounter: Payer: Self-pay | Admitting: Podiatry

## 2022-05-22 ENCOUNTER — Ambulatory Visit (INDEPENDENT_AMBULATORY_CARE_PROVIDER_SITE_OTHER): Payer: Medicare Other

## 2022-05-22 ENCOUNTER — Ambulatory Visit: Payer: Medicare Other | Admitting: Podiatry

## 2022-05-22 DIAGNOSIS — M779 Enthesopathy, unspecified: Secondary | ICD-10-CM

## 2022-05-22 DIAGNOSIS — L600 Ingrowing nail: Secondary | ICD-10-CM

## 2022-05-22 NOTE — Patient Instructions (Signed)

## 2022-05-22 NOTE — Progress Notes (Signed)
Subjective:   Patient ID: Carrie Page, female   DOB: 88 y.o.   MRN: [id number]   HPI Patient presents stating she has a painful big toenail left that is been ingrown she has been trying to work on herself but having trouble and its had some redness no active drainage.  Patient does not smoke tries to be active for her advanced age   Review of Systems  All other systems reviewed and are negative.       Objective:  Physical Exam Vitals and nursing note reviewed.  Constitutional:      Appearance: She is well-developed.  Pulmonary:     Effort: Pulmonary effort is normal.  Musculoskeletal:        General: Normal range of motion.  Skin:    General: Skin is warm.  Neurological:     Mental Status: She is alert.     Neurovascular status found to be diminished diminishment of PT and DP pulses bilateral with diminished neurological.  Range of motion adequate subtalar midtarsal joint diminished range of motion of the first MPJ and diminished muscle strength.  Patient has an incurvated left hallux medial border that is painful when pressed with no active drainage noted but obvious signs that it is ingrown.  Good digital perfusion well oriented      Assessment:  Ingrown toenail left hallux with moderate risk altered with diminished circulatory status neurological status     Plan:  H&P all conditions reviewed and I do think correction of the ingrown would be best for her but not permanent due to her circulatory status.  I explained this procedure to her she agrees and today I infiltrated the left hallux 60 mg like Marcaine mixture sterile prep done using sterile instrumentation removed the medial border removed proud flesh abscess tissue the lateral channel and this may need to be done at 1 point in future with sterile dressing applied instructions on soaks

## 2022-12-19 DIAGNOSIS — H524 Presbyopia: Secondary | ICD-10-CM | POA: Diagnosis not present

## 2022-12-19 DIAGNOSIS — H40013 Open angle with borderline findings, low risk, bilateral: Secondary | ICD-10-CM | POA: Diagnosis not present

## 2022-12-19 DIAGNOSIS — H35013 Changes in retinal vascular appearance, bilateral: Secondary | ICD-10-CM | POA: Diagnosis not present

## 2022-12-19 DIAGNOSIS — H35363 Drusen (degenerative) of macula, bilateral: Secondary | ICD-10-CM | POA: Diagnosis not present

## 2022-12-19 DIAGNOSIS — H26491 Other secondary cataract, right eye: Secondary | ICD-10-CM | POA: Diagnosis not present

## 2023-05-14 ENCOUNTER — Ambulatory Visit (INDEPENDENT_AMBULATORY_CARE_PROVIDER_SITE_OTHER): Payer: Medicare Other | Admitting: Family Medicine

## 2023-05-14 ENCOUNTER — Ambulatory Visit (INDEPENDENT_AMBULATORY_CARE_PROVIDER_SITE_OTHER): Payer: Medicare Other

## 2023-05-14 ENCOUNTER — Encounter: Payer: Self-pay | Admitting: Family Medicine

## 2023-05-14 VITALS — BP 142/88 | HR 79 | Temp 97.6°F | Ht 60.0 in | Wt 118.0 lb

## 2023-05-14 DIAGNOSIS — H9190 Unspecified hearing loss, unspecified ear: Secondary | ICD-10-CM | POA: Insufficient documentation

## 2023-05-14 DIAGNOSIS — M79672 Pain in left foot: Secondary | ICD-10-CM | POA: Diagnosis not present

## 2023-05-14 DIAGNOSIS — M7732 Calcaneal spur, left foot: Secondary | ICD-10-CM | POA: Diagnosis not present

## 2023-05-14 NOTE — Patient Instructions (Addendum)
Please go downstairs for an x-ray of your left heel before you leave.  I recommend using an ice pack on the area as a well as Voltaren gel.  Wear good fitting shoes.  You will most likely need to follow-up with your podiatrist

## 2023-05-14 NOTE — Progress Notes (Signed)
Subjective:     Patient ID: Carrie Page, female    DOB: 11-Mar-1928, 89 y.o.   MRN: [id number]  Chief Complaint  Patient presents with   Foot Pain    Left foot pain for 3 months. Bump on back of her heel    Foot Pain    Discussed the use of AI scribe software for clinical note transcription with the patient, who gave verbal consent to proceed.  History of Present Illness         C/o left heel pain and a growth x 3 months. The area is painful to touch and increased pain with walking.  No injury.      Health Maintenance Due  Topic Date Due   Zoster Vaccines- Shingrix (1 of 2) Never done   OPHTHALMOLOGY EXAM  12/24/2022   FOOT EXAM  05/10/2023   Medicare Annual Wellness (AWV)  05/15/2023    Past Medical History:  Diagnosis Date   Allergic rhinitis, cause unspecified 01/28/2012   Allergy    Anemia    Anxiety 01/28/2012   Arthritis    Cecal cancer (HCC)    Degenerative joint disease 01/28/2012   Depression    History of nonmelanoma skin cancer    Hypertension    Impaired glucose tolerance    Osteoporosis    RT SHOULDER   PONV (postoperative nausea and vomiting)     Past Surgical History:  Procedure Laterality Date   ABDOMINAL HYSTERECTOMY     APPENDECTOMY     BACK SURGERY  2010   MICRODCISCECTOMY   CHOLECYSTECTOMY     LAPAROSCOPIC PARTIAL COLECTOMY N/A 06/20/2014   Procedure: LAPAROSCOPIC ASSISTED RIGHT PARTIAL COLECTOMY;  Surgeon: Romie Levee, MD;  Location: [facility];  Service: General;  Laterality: N/A;   TONSILLECTOMY      Family History  Problem Relation Age of Onset   Arthritis Other    Heart disease Other    Stroke Other    Colon cancer Father        unsure age    Social History   Socioeconomic History   Marital status: Widowed    Spouse name: Not on file   Number of children: Not on file   Years of education: Not on file   Highest education level: Not on file  Occupational History   Occupation: retired  Tobacco Use   Smoking  status: Never   Smokeless tobacco: Never  Substance and Sexual Activity   Alcohol use: No    Alcohol/week: 0.0 standard drinks of alcohol   Drug use: No   Sexual activity: Not Currently  Other Topics Concern   Not on file  Social History Narrative   Not on file   Social Determinants of Health   Financial Resource Strain: Low Risk  (05/14/2022)   Overall Financial Resource Strain (CARDIA)    Difficulty of Paying Living Expenses: Not hard at all  Food Insecurity: No Food Insecurity (05/14/2022)   Hunger Vital Sign    Worried About Running Out of Food in the Last Year: Never true    Ran Out of Food in the Last Year: Never true  Transportation Needs: No Transportation Needs (05/14/2022)   PRAPARE - Administrator, Civil Service (Medical): No    Lack of Transportation (Non-Medical): No  Physical Activity: Sufficiently Active (05/14/2022)   Exercise Vital Sign    Days of Exercise per Week: 5 days    Minutes of Exercise per Session: 30 min  Stress: No  Stress Concern Present (05/14/2022)   [employer] of Occupational Health - Occupational Stress Questionnaire    Feeling of Stress : Not at all  Social Connections: Moderately Integrated (05/14/2022)   Social Connection and Isolation Panel [NHANES]    Frequency of Communication with Friends and Family: More than three times a week    Frequency of Social Gatherings with Friends and Family: Twice a week    Attends Religious Services: More than 4 times per year    Active Member of [employer] or Organizations: Yes    Attends Banker Meetings: More than 4 times per year    Marital Status: Widowed  Intimate Partner Violence: Not At Risk (05/14/2022)   Humiliation, Afraid, Rape, and Kick questionnaire    Fear of Current or Ex-Partner: No    Emotionally Abused: No    Physically Abused: No    Sexually Abused: No    Outpatient Medications Prior to Visit  Medication Sig Dispense Refill   aspirin EC 81 MG tablet Take 81 mg by  mouth daily.     candesartan (ATACAND) 32 MG tablet Take 1 tablet (32 mg total) by mouth daily. 90 tablet 3   Cholecalciferol (VITAMIN D3) 1000 UNITS CAPS Take 1 capsule by mouth daily.     diazepam (VALIUM) 5 MG tablet TAKE ONE TABLET BY MOUTH ONCE DAILY AS NEEDED FOR ANXIETY 30 tablet 2   MULTIPLE VITAMIN PO Take 1 tablet by mouth daily.     vitamin B-12 (CYANOCOBALAMIN) 1000 MCG tablet Take 1 tablet (1,000 mcg total) by mouth daily. 90 tablet 3   No facility-administered medications prior to visit.    Allergies  Allergen Reactions   Bactrim [Sulfamethoxazole-Trimethoprim] Itching, Rash and Other (See Comments)    Rash, itching, diaphoretic   Lovastatin Other (See Comments)    Cognitive slowing and myalgias   Nitrofurantoin Other (See Comments)    GI upset   Penicillins Itching   Pravastatin Other (See Comments)    Myalgias, increased dreams   Naproxen Rash    ROS     Objective:    Physical Exam Constitutional:      General: She is not in acute distress.    Appearance: She is not ill-appearing.  Cardiovascular:     Rate and Rhythm: Normal rate.  Pulmonary:     Effort: Pulmonary effort is normal.  Musculoskeletal:     Cervical back: Normal range of motion.     Right ankle: Normal.     Left ankle: Deformity present. Tenderness present. Decreased range of motion. Normal pulse.     Comments: Tender firm lump over back of heel. Pain with plantar flexion LLE neurovascularly intact.    Skin:    General: Skin is warm and dry.  Neurological:     General: No focal deficit present.     Mental Status: She is alert and oriented to person, place, and time.     Sensory: No sensory deficit.     Motor: No weakness.     Gait: Gait abnormal.  Psychiatric:        Mood and Affect: Mood normal.        Behavior: Behavior normal.        Thought Content: Thought content normal.      BP (!) 142/88 (BP Location: Left Arm, Patient Position: Sitting, Cuff Size: Normal)   Pulse 79    Temp 97.6 F (36.4 C) (Temporal)   Ht 5' (1.524 m)   Wt 118 lb (53.5 kg)  SpO2 99%   BMI 23.05 kg/m  Wt Readings from Last 3 Encounters:  05/14/23 118 lb (53.5 kg)  05/09/22 123 lb 12.8 oz (56.2 kg)  11/08/21 122 lb (55.3 kg)       Assessment & Plan:   Problem List Items Addressed This Visit   None Visit Diagnoses     Pain of left heel    -  Primary   Relevant Orders   DG Foot Complete Left      X ray ordered. Discussed conservative management. She may follow up with podiatrist as needed.   I am having Carrie Page maintain her Vitamin D3, aspirin EC, MULTIPLE VITAMIN PO, cyanocobalamin, diazepam, and candesartan.  No orders of the defined types were placed in this encounter.

## 2023-05-15 ENCOUNTER — Encounter: Payer: Medicare Other | Admitting: Internal Medicine

## 2023-05-20 ENCOUNTER — Other Ambulatory Visit: Payer: Self-pay | Admitting: Internal Medicine

## 2023-05-21 NOTE — Progress Notes (Signed)
Heel spurs noted as well as mild osteoarthritis. No acute changes. May follow up with PCP or podiatrist if pain not improving.

## 2023-05-27 ENCOUNTER — Encounter: Payer: Self-pay | Admitting: Internal Medicine

## 2023-05-27 ENCOUNTER — Ambulatory Visit (INDEPENDENT_AMBULATORY_CARE_PROVIDER_SITE_OTHER): Payer: Medicare Other | Admitting: Internal Medicine

## 2023-05-27 VITALS — BP 138/82 | HR 70 | Temp 98.8°F | Ht 60.0 in | Wt 119.0 lb

## 2023-05-27 DIAGNOSIS — M7662 Achilles tendinitis, left leg: Secondary | ICD-10-CM

## 2023-05-27 DIAGNOSIS — E559 Vitamin D deficiency, unspecified: Secondary | ICD-10-CM | POA: Diagnosis not present

## 2023-05-27 DIAGNOSIS — Z0000 Encounter for general adult medical examination without abnormal findings: Secondary | ICD-10-CM | POA: Diagnosis not present

## 2023-05-27 DIAGNOSIS — E538 Deficiency of other specified B group vitamins: Secondary | ICD-10-CM

## 2023-05-27 DIAGNOSIS — I10 Essential (primary) hypertension: Secondary | ICD-10-CM

## 2023-05-27 DIAGNOSIS — E785 Hyperlipidemia, unspecified: Secondary | ICD-10-CM | POA: Diagnosis not present

## 2023-05-27 DIAGNOSIS — R7302 Impaired glucose tolerance (oral): Secondary | ICD-10-CM | POA: Diagnosis not present

## 2023-05-27 DIAGNOSIS — Z0001 Encounter for general adult medical examination with abnormal findings: Secondary | ICD-10-CM

## 2023-05-27 LAB — URINALYSIS, ROUTINE W REFLEX MICROSCOPIC
Bilirubin Urine: NEGATIVE
Ketones, ur: NEGATIVE
Nitrite: NEGATIVE
Specific Gravity, Urine: 1.015 (ref 1.000–1.030)
Total Protein, Urine: NEGATIVE
Urine Glucose: NEGATIVE
Urobilinogen, UA: 0.2 (ref 0.0–1.0)
pH: 6 (ref 5.0–8.0)

## 2023-05-27 LAB — CBC WITH DIFFERENTIAL/PLATELET
Basophils Absolute: 0.1 10*3/uL (ref 0.0–0.1)
Basophils Relative: 0.8 % (ref 0.0–3.0)
Eosinophils Absolute: 0.2 10*3/uL (ref 0.0–0.7)
Eosinophils Relative: 2.5 % (ref 0.0–5.0)
HCT: 40.4 % (ref 36.0–46.0)
Hemoglobin: 12.9 g/dL (ref 12.0–15.0)
Lymphocytes Relative: 36.7 % (ref 12.0–46.0)
Lymphs Abs: 2.3 10*3/uL (ref 0.7–4.0)
MCHC: 32 g/dL (ref 30.0–36.0)
MCV: 89.2 fl (ref 78.0–100.0)
Monocytes Absolute: 0.4 10*3/uL (ref 0.1–1.0)
Monocytes Relative: 5.9 % (ref 3.0–12.0)
Neutro Abs: 3.4 10*3/uL (ref 1.4–7.7)
Neutrophils Relative %: 54.1 % (ref 43.0–77.0)
Platelets: 218 10*3/uL (ref 150.0–400.0)
RBC: 4.52 Mil/uL (ref 3.87–5.11)
RDW: 13.6 % (ref 11.5–15.5)
WBC: 6.3 10*3/uL (ref 4.0–10.5)

## 2023-05-27 LAB — BASIC METABOLIC PANEL
BUN: 22 mg/dL (ref 6–23)
CO2: 29 mEq/L (ref 19–32)
Calcium: 9.9 mg/dL (ref 8.4–10.5)
Chloride: 100 mEq/L (ref 96–112)
Creatinine, Ser: 0.6 mg/dL (ref 0.40–1.20)
GFR: 77.81 mL/min (ref >60.00)
Glucose, Bld: 108 mg/dL — ABNORMAL HIGH (ref 70–99)
Potassium: 5.1 mEq/L (ref 3.5–5.1)
Sodium: 135 mEq/L (ref 135–145)

## 2023-05-27 LAB — LIPID PANEL
Cholesterol: 202 mg/dL — ABNORMAL HIGH (ref 0–200)
HDL: 65 mg/dL (ref >39.00)
LDL Cholesterol: 106 mg/dL — ABNORMAL HIGH (ref 0–99)
NonHDL: 137.39
Total CHOL/HDL Ratio: 3
Triglycerides: 155 mg/dL — ABNORMAL HIGH (ref 0.0–149.0)
VLDL: 31 mg/dL (ref 0.0–40.0)

## 2023-05-27 LAB — HEPATIC FUNCTION PANEL
ALT: 16 U/L (ref 0–35)
AST: 23 U/L (ref 0–37)
Albumin: 4.4 g/dL (ref 3.5–5.2)
Alkaline Phosphatase: 82 U/L (ref 39–117)
Bilirubin, Direct: 0.1 mg/dL (ref 0.0–0.3)
Total Bilirubin: 0.5 mg/dL (ref 0.2–1.2)
Total Protein: 8 g/dL (ref 6.0–8.3)

## 2023-05-27 LAB — HEMOGLOBIN A1C: Hgb A1c MFr Bld: 6 % (ref 4.6–6.5)

## 2023-05-27 LAB — VITAMIN D 25 HYDROXY (VIT D DEFICIENCY, FRACTURES): VITD: 35 ng/mL (ref 30.00–100.00)

## 2023-05-27 LAB — TSH: TSH: 1.44 u[IU]/mL (ref 0.35–5.50)

## 2023-05-27 LAB — VITAMIN B12: Vitamin B-12: 444 pg/mL (ref 211–911)

## 2023-05-27 NOTE — Progress Notes (Signed)
The test results show that your current treatment is OK, as the tests are stable.  Please continue the same plan.  There is no other need for change of treatment or further evaluation based on these results, at this time.  thanks 

## 2023-05-27 NOTE — Patient Instructions (Signed)
Please have your Shingrix (shingles) shots done at your local pharmacy.  Please continue all other medications as before, and refills have been done if requested.  Please have the pharmacy call with any other refills you may need.  Please continue your efforts at being more active, low cholesterol diet, and weight control.  You are otherwise up to date with prevention measures today.  Please keep your appointments with your specialists as you may have planned  You will be contacted regarding the referral for: Sports Medicine  Please go to the LAB at the blood drawing area for the tests to be done  You will be contacted by phone if any changes need to be made immediately.  Otherwise, you will receive a letter about your results with an explanation, but please check with MyChart first.  Please remember to sign up for MyChart if you have not done so, as this will be important to you in the future with finding out test results, communicating by private email, and scheduling acute appointments online when needed.  Please make an Appointment to return in 6 months, or sooner if needed

## 2023-05-27 NOTE — Progress Notes (Signed)
Patient ID: Carrie Page, female   DOB: 1928/09/27, 89 y.o.   MRN: [id number]         Chief Complaint:: wellness exam and left achilles tendonitis, hyperglycemia, htn, hld, low b12       HPI:  Carrie Page is a 89 y.o. female here for wellness exam; for shingrix at pharmacy, declines covid booster, o/w up to date                        Also Pt denies chest pain, increased sob or doe, wheezing, orthopnea, PND, increased LE swelling, palpitations, dizziness or syncope.   Pt denies polydipsia, polyuria, or new focal neuro s/s.    Pt denies fever, wt loss, night sweats, loss of appetite, or other constitutional symptoms  Also with 3 mo onset mod persistent worsening left achilles tendonitis worst at the heel insertion site.      Wt Readings from Last 3 Encounters:  05/27/23 119 lb (54 kg)  05/14/23 118 lb (53.5 kg)  05/09/22 123 lb 12.8 oz (56.2 kg)   BP Readings from Last 3 Encounters:  05/27/23 138/82  05/14/23 (!) 142/88  05/09/22 (!) 160/78   Immunization History  Administered Date(s) Administered   Fluad Quad(high Dose 65+) 11/18/2019, 11/08/2020, 11/08/2021   Influenza Split 09/03/2012   Influenza, High Dose Seasonal PF 11/04/2016, 10/22/2017, 12/23/2018   Influenza,inj,Quad PF,6+ Mos 09/09/2013, 09/15/2014, 09/07/2015   Moderna SARS-COV2 Booster Vaccination 12/13/2020   Moderna Sars-Covid-2 Vaccination 04/19/2020, 05/17/2020   Pneumococcal Conjugate-13 09/09/2013   Pneumococcal Polysaccharide-23 01/28/2012   Tdap 03/19/2016   Health Maintenance Due  Topic Date Due   Zoster Vaccines- Shingrix (1 of 2) Never done   COVID-19 Vaccine (3 - Moderna risk series) 01/10/2021   Medicare Annual Wellness (AWV)  05/15/2023      Past Medical History:  Diagnosis Date   Allergic rhinitis, cause unspecified 01/28/2012   Allergy    Anemia    Anxiety 01/28/2012   Arthritis    Cecal cancer (HCC)    Degenerative joint disease 01/28/2012   Depression    History of nonmelanoma skin  cancer    Hypertension    Impaired glucose tolerance    Osteoporosis    RT SHOULDER   PONV (postoperative nausea and vomiting)    Past Surgical History:  Procedure Laterality Date   ABDOMINAL HYSTERECTOMY     APPENDECTOMY     BACK SURGERY  2010   MICRODCISCECTOMY   CHOLECYSTECTOMY     LAPAROSCOPIC PARTIAL COLECTOMY N/A 06/20/2014   Procedure: LAPAROSCOPIC ASSISTED RIGHT PARTIAL COLECTOMY;  Surgeon: Romie Levee, MD;  Location: [facility];  Service: General;  Laterality: N/A;   TONSILLECTOMY      reports that she has never smoked. She has never used smokeless tobacco. She reports that she does not drink alcohol and does not use drugs. family history includes Arthritis in an other family member; Colon cancer in her father; Heart disease in an other family member; Stroke in an other family member. Allergies  Allergen Reactions   Bactrim [Sulfamethoxazole-Trimethoprim] Itching, Rash and Other (See Comments)    Rash, itching, diaphoretic   Lovastatin Other (See Comments)    Cognitive slowing and myalgias   Nitrofurantoin Other (See Comments)    GI upset   Penicillins Itching   Pravastatin Other (See Comments)    Myalgias, increased dreams   Naproxen Rash   Current Outpatient Medications on File Prior to Visit  Medication Sig Dispense Refill  aspirin EC 81 MG tablet Take 81 mg by mouth daily.     candesartan (ATACAND) 32 MG tablet Take 1 tablet (32 mg total) by mouth daily. Annual appt is due must see provider for future refills 30 tablet 0   Cholecalciferol (VITAMIN D3) 1000 UNITS CAPS Take 1 capsule by mouth daily.     diazepam (VALIUM) 5 MG tablet TAKE ONE TABLET BY MOUTH ONCE DAILY AS NEEDED FOR ANXIETY 30 tablet 2   MULTIPLE VITAMIN PO Take 1 tablet by mouth daily.     vitamin B-12 (CYANOCOBALAMIN) 1000 MCG tablet Take 1 tablet (1,000 mcg total) by mouth daily. 90 tablet 3   No current facility-administered medications on file prior to visit.        ROS:  All others reviewed  and negative.  Objective        PE:  BP 138/82 (BP Location: Right Arm, Patient Position: Sitting, Cuff Size: Normal)   Pulse 70   Temp 98.8 F (37.1 C) (Oral)   Ht 5' (1.524 m)   Wt 119 lb (54 kg)   SpO2 98%   BMI 23.24 kg/m                 Constitutional: Pt appears in NAD               HENT: Head: NCAT.                Right Ear: External ear normal.                 Left Ear: External ear normal.                Eyes: . Pupils are equal, round, and reactive to light. Conjunctivae and EOM are normal               Nose: without d/c or deformity               Neck: Neck supple. Gross normal ROM               Cardiovascular: Normal rate and regular rhythm.                 Pulmonary/Chest: Effort normal and breath sounds without rales or wheezing.                Abd:  Soft, NT, ND, + BS, no organomegaly               Neurological: Pt is alert. At baseline orientation, motor grossly intact               Skin: Skin is warm. No rashes, no other new lesions, LE edema - none; mod left achilles insertion site tenderness and soft tissue swelling               Psychiatric: Pt behavior is normal without agitation   Micro: none  Cardiac tracings I have personally interpreted today:  none  Pertinent Radiological findings (summarize): none   Lab Results  Component Value Date   WBC 6.3 05/27/2023   HGB 12.9 05/27/2023   HCT 40.4 05/27/2023   PLT 218.0 05/27/2023   GLUCOSE 108 (H) 05/27/2023   CHOL 202 (H) 05/27/2023   TRIG 155.0 (H) 05/27/2023   HDL 65.00 05/27/2023   LDLDIRECT 124.0 03/04/2013   LDLCALC 106 (H) 05/27/2023   ALT 16 05/27/2023   AST 23 05/27/2023   NA 135 05/27/2023   K 5.1 05/27/2023  CL 100 05/27/2023   CREATININE 0.60 05/27/2023   BUN 22 05/27/2023   CO2 29 05/27/2023   TSH 1.44 05/27/2023   HGBA1C 6.0 05/27/2023   Assessment/Plan:  Carrie Page is a 89 y.o. White or Caucasian [1] female with  has a past medical history of Allergic rhinitis, cause  unspecified (01/28/2012), Allergy, Anemia, Anxiety (01/28/2012), Arthritis, Cecal cancer (HCC), Degenerative joint disease (01/28/2012), Depression, History of nonmelanoma skin cancer, Hypertension, Impaired glucose tolerance, Osteoporosis, and PONV (postoperative nausea and vomiting).  Encounter for well adult exam with abnormal findings Age and sex appropriate education and counseling updated with regular exercise and diet Referrals for preventative services - none needed Immunizations addressed - for shingrix at pharmacy, declines covid booster Smoking counseling  - none needed Evidence for depression or other mood disorder - none significant Most recent labs reviewed. I have personally reviewed and have noted: 1) the patient's medical and social history 2) The patient's current medications and supplements 3) The patient's height, weight, and BMI have been recorded in the chart   Impaired glucose tolerance Lab Results  Component Value Date   HGBA1C 6.0 05/27/2023   Stable, pt to continue current medical treatment  - diet, wt control   Hypertension BP Readings from Last 3 Encounters:  05/27/23 138/82  05/14/23 (!) 142/88  05/09/22 (!) 160/78   Stable, pt to continue medical treatment atacand 32 mg qd   Hyperlipidemia Lab Results  Component Value Date   LDLCALC 106 (H) 05/27/2023   Uncontrolled,  pt for lower chol diet, declines statin   B12 deficiency Lab Results  Component Value Date   VITAMINB12 444 05/27/2023   Stable, cont oral replacement - b12 1000 mcg qd   Achilles tendinitis of left lower extremity Mod with limping today, for sport med referral, may need boot  Followup: Return in about 6 months (around 11/26/2023).  Oliver Barre, MD 05/31/2023 2:13 PM [facility]

## 2023-05-31 ENCOUNTER — Encounter: Payer: Self-pay | Admitting: Internal Medicine

## 2023-05-31 DIAGNOSIS — M7662 Achilles tendinitis, left leg: Secondary | ICD-10-CM | POA: Insufficient documentation

## 2023-05-31 NOTE — Assessment & Plan Note (Signed)
Mod with limping today, for sport med referral, may need boot

## 2023-05-31 NOTE — Assessment & Plan Note (Signed)
BP Readings from Last 3 Encounters:  05/27/23 138/82  05/14/23 (!) 142/88  05/09/22 (!) 160/78   Stable, pt to continue medical treatment atacand 32 mg qd

## 2023-05-31 NOTE — Assessment & Plan Note (Signed)
Lab Results  Component Value Date   LDLCALC 106 (H) 05/27/2023   Uncontrolled,  pt for lower chol diet, declines statin

## 2023-05-31 NOTE — Assessment & Plan Note (Signed)
Lab Results  Component Value Date   VITAMINB12 444 05/27/2023   Stable, cont oral replacement - b12 1000 mcg qd

## 2023-05-31 NOTE — Assessment & Plan Note (Signed)
Lab Results  Component Value Date   HGBA1C 6.0 05/27/2023   Stable, pt to continue current medical treatment  - diet, wt control

## 2023-05-31 NOTE — Assessment & Plan Note (Signed)
Age and sex appropriate education and counseling updated with regular exercise and diet Referrals for preventative services - none needed Immunizations addressed - for shingrix at pharmacy, declines covid booster Smoking counseling  - none needed Evidence for depression or other mood disorder - none significant Most recent labs reviewed. I have personally reviewed and have noted: 1) the patient's medical and social history 2) The patient's current medications and supplements 3) The patient's height, weight, and BMI have been recorded in the chart  

## 2023-06-02 ENCOUNTER — Ambulatory Visit: Payer: Medicare Other | Admitting: Family Medicine

## 2023-06-02 ENCOUNTER — Encounter: Payer: Self-pay | Admitting: Family Medicine

## 2023-06-02 VITALS — BP 150/80 | HR 83 | Ht 60.0 in | Wt 119.4 lb

## 2023-06-02 DIAGNOSIS — M7662 Achilles tendinitis, left leg: Secondary | ICD-10-CM | POA: Diagnosis not present

## 2023-06-02 NOTE — Patient Instructions (Addendum)
Thank you for coming in today.  Please use Voltaren gel (Generic Diclofenac Gel) up to 4x daily for pain as needed.  This is available over-the-counter as both the name brand Voltaren gel and the generic diclofenac gel.  Night splint for plantar fasciitis.   Work on the home exercises today.   Recheck in 1 month.

## 2023-06-02 NOTE — Progress Notes (Signed)
   I, Stevenson Clinch, CMA acting as a scribe for Clementeen Graham, MD.  Carrie Page is a 89 y.o. female who presents to [facility] today for L lower leg pain x 3 months. Pt locates pain to he posterior ankle and heel. Pt reports XR showing bone spur. Sx are constant. Pulling sensation when ambulating. Swelling throughout the joint. Notes that her son had a similar issue a few months back and was prescribed some medication which helped.   Aggravates: ambulation Treatments tried: [employer], Epsom Salt soaking  Dx imaging: 05/14/23 L foot XR  Pertinent review of systems: No fevers or chills  Relevant historical information: History of colon cancer   Exam:  BP (!) 150/80   Pulse 83   Ht 5' (1.524 m)   Wt 119 lb 6.4 oz (54.2 kg)   SpO2 97%   BMI 23.32 kg/m  General: Well Developed, well nourished, and in no acute distress.   MSK: Left heel normal appearing. Mildly tender palpation posterior calcaneus near Achilles tendon insertion.  Normal foot and ankle motion.  Some pain with resisted foot plantarflexion.    Lab and Radiology Results  EXAM: LEFT FOOT - COMPLETE 3+ VIEW   COMPARISON:  None Available.   FINDINGS: There is diffuse decreased bone mineralization. Small plantar and posterior calcaneal heel spurs. Mild great toe metatarsophalangeal joint space narrowing and peripheral osteophytosis. No acute fracture or dislocation.   IMPRESSION: 1. Small plantar and posterior calcaneal heel spurs. 2. Mild great toe metatarsophalangeal joint osteoarthritis.     Electronically Signed   By: Neita Garnet M.D.   On: 05/21/2023 09:28 I, Clementeen Graham, personally (independently) visualized and performed the interpretation of the images attached in this note.     Assessment and Plan: 89 y.o. female with chronic calcific Achilles tendinitis left foot.  Plan for eccentric exercises taught in clinic today prior to discharge.  Also recommend Voltaren  gel.  She has good arch support and heel lift in her shoe already.  Recommend also a night splint.  Recheck in 1 month if not better consider diagnostic ultrasound and shockwave.   Discussed warning signs or symptoms. Please see discharge instructions. Patient expresses understanding.   The above documentation has been reviewed and is accurate and complete Clementeen Graham, M.D.

## 2023-06-23 ENCOUNTER — Telehealth: Payer: Self-pay | Admitting: Internal Medicine

## 2023-06-23 MED ORDER — CANDESARTAN CILEXETIL 32 MG PO TABS: 32.0000 mg | ORAL_TABLET | Freq: Every day | ORAL | 3 refills | Status: DC

## 2023-06-23 NOTE — Telephone Encounter (Signed)
Patient would like lab results from 05/27/23 mailed to the address on file. Best callback is [phone].

## 2023-06-23 NOTE — Telephone Encounter (Signed)
 Refill sent to [facility].../lmb 

## 2023-06-23 NOTE — Telephone Encounter (Signed)
Labs has been mailed to pt.

## 2023-06-23 NOTE — Telephone Encounter (Signed)
Prescription Request  06/23/2023  LOV: 05/27/2023  What is the name of the medication or equipment? candesartan (ATACAND) 32 MG tablet   Have you contacted your pharmacy to request a refill? No   Which pharmacy would you like this sent to?  [facility] [address], [place] - [address] [place] [address] Phone: [phone] Fax: [phone]    Patient notified that their request is being sent to the clinical staff for review and that they should receive a response within 2 business days.   Please advise at [facility] [phone]

## 2023-07-01 ENCOUNTER — Other Ambulatory Visit: Payer: Self-pay

## 2023-07-01 ENCOUNTER — Ambulatory Visit: Payer: Medicare Other | Admitting: Family Medicine

## 2023-07-01 VITALS — BP 192/78 | HR 74 | Ht 60.0 in | Wt 118.0 lb

## 2023-07-01 DIAGNOSIS — M7662 Achilles tendinitis, left leg: Secondary | ICD-10-CM | POA: Diagnosis not present

## 2023-07-01 NOTE — Patient Instructions (Addendum)
Thank you for coming in today.   I've referred you to Physical Therapy.  Let us know if you don't hear from them in one week.   Recheck in 6 weeks.   Let me know sooner if this is not working.   Ok to ask about costs.

## 2023-07-01 NOTE — Progress Notes (Signed)
   Rubin Payor, PhD, LAT, ATC acting as a scribe for Clementeen Graham, MD.   Carrie Page is a 89 y.o. female who presents to [facility] today for f/u L Achilles tendinopathy. Pt was last seen by Dr. Denyse Amass on 06/02/23 and was advised to use Voltaren gel, night splint and was taught eccentric exercises. Today, pt reports L Achilles is a little bit better, but is still problematic. She has been compliant w/ HEP. She notes she had several patches of a rash on her torso which resolved after she stopped using the Voltaren gel. She c/o ankle pronation, esp on her R.  Dx imaging: 05/14/23 L foot XR   Pertinent review of systems: No fevers or chills  Relevant historical information: History of colon cancer   Exam:  BP (!) 192/78   Pulse 74   Ht 5' (1.524 m)   Wt 118 lb (53.5 kg)   SpO2 98%   BMI 23.05 kg/m  General: Well Developed, well nourished, and in no acute distress.   MSK: Left foot and ankle some swelling at the posterior and lateral aspect of the ankle. Normal foot and ankle motion. Tender to palpation mildly at Achilles tendon insertion and at the lateral ankle near the peroneal tendon. Intact strength. Pulses capillary refill and sensation are intact distally.    Lab and Radiology Results  Diagnostic Limited MSK Ultrasound of: Left ankle Achilles tendon is intact.  However the tendon is much thicker than would be typical near the insertion site measuring about 0.8 cm.  Calcific change present at the tendon insertion consistent with chronic calcific tendinitis.  Increased vascular activity is present superficial to the tendon consistent with peritendinitis.  No visible tears present within the Achilles tendon. Peroneal tendons are intact without obvious tear.  Minimal fluid collecting around the tendon and the tendon sheath consistent with peroneal tenosynovitis. Impression: Primarily chronic calcific Achilles tendinitis and minimal peroneal  tendinitis     Assessment and Plan: 89 y.o. female with left foot and ankle pain due primarily to chronic calcific Achilles tendinitis.  She has been doing home exercise program for the last month which has helped but not enough.  Plan to add formal physical therapy which should be helpful.  Next step if needed could be shockwave however this is not ideal as it is going to be expensive for her. Recheck in 6 weeks.   PDMP not reviewed this encounter. Orders Placed This Encounter  Procedures   [place] LIMITED JOINT SPACE STRUCTURES LOW RIGHT(NO LINKED CHARGES)    Order Specific Question:   Reason for Exam (SYMPTOM  OR DIAGNOSIS REQUIRED)    Answer:   right ankle pain    Order Specific Question:   Preferred imaging location?    Answer:   [facility] referral to Physical Therapy    Referral Priority:   Routine    Referral Type:   Physical Medicine    Referral Reason:   Specialty Services Required    Requested Specialty:   Physical Therapy    Number of Visits Requested:   1   No orders of the defined types were placed in this encounter.    Discussed warning signs or symptoms. Please see discharge instructions. Patient expresses understanding.   The above documentation has been reviewed and is accurate and complete Clementeen Graham, M.D.

## 2023-07-09 ENCOUNTER — Encounter: Payer: Self-pay | Admitting: Physical Therapy

## 2023-07-09 ENCOUNTER — Ambulatory Visit: Payer: Medicare Other | Admitting: Physical Therapy

## 2023-07-09 DIAGNOSIS — R262 Difficulty in walking, not elsewhere classified: Secondary | ICD-10-CM | POA: Insufficient documentation

## 2023-07-09 DIAGNOSIS — M25672 Stiffness of left ankle, not elsewhere classified: Secondary | ICD-10-CM | POA: Diagnosis not present

## 2023-07-09 DIAGNOSIS — M7662 Achilles tendinitis, left leg: Secondary | ICD-10-CM | POA: Insufficient documentation

## 2023-07-09 DIAGNOSIS — M25572 Pain in left ankle and joints of left foot: Secondary | ICD-10-CM | POA: Diagnosis not present
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
## 2023-07-09 NOTE — Patient Instructions (Signed)

## 2023-07-15 ENCOUNTER — Ambulatory Visit: Payer: Medicare Other | Attending: Family Medicine | Admitting: Physical Therapy

## 2023-07-15 DIAGNOSIS — M25672 Stiffness of left ankle, not elsewhere classified: Secondary | ICD-10-CM | POA: Insufficient documentation

## 2023-07-15 DIAGNOSIS — M25572 Pain in left ankle and joints of left foot: Secondary | ICD-10-CM | POA: Insufficient documentation

## 2023-07-15 DIAGNOSIS — R262 Difficulty in walking, not elsewhere classified: Secondary | ICD-10-CM | POA: Insufficient documentation
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
## 2023-07-22 ENCOUNTER — Ambulatory Visit: Payer: Medicare Other | Admitting: Physical Therapy

## 2023-07-22 DIAGNOSIS — M25572 Pain in left ankle and joints of left foot: Secondary | ICD-10-CM | POA: Diagnosis not present

## 2023-07-22 DIAGNOSIS — R262 Difficulty in walking, not elsewhere classified: Secondary | ICD-10-CM | POA: Diagnosis not present

## 2023-07-22 DIAGNOSIS — M25672 Stiffness of left ankle, not elsewhere classified: Secondary | ICD-10-CM | POA: Diagnosis not present
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
## 2023-07-29 ENCOUNTER — Ambulatory Visit: Payer: Medicare Other | Admitting: Physical Therapy

## 2023-07-29 DIAGNOSIS — M25672 Stiffness of left ankle, not elsewhere classified: Secondary | ICD-10-CM

## 2023-07-29 DIAGNOSIS — R262 Difficulty in walking, not elsewhere classified: Secondary | ICD-10-CM

## 2023-07-29 DIAGNOSIS — M25572 Pain in left ankle and joints of left foot: Secondary | ICD-10-CM | POA: Diagnosis not present
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
## 2023-08-05 ENCOUNTER — Ambulatory Visit: Payer: Medicare Other | Admitting: Physical Therapy

## 2023-08-05 DIAGNOSIS — M25572 Pain in left ankle and joints of left foot: Secondary | ICD-10-CM | POA: Diagnosis not present

## 2023-08-05 DIAGNOSIS — R262 Difficulty in walking, not elsewhere classified: Secondary | ICD-10-CM | POA: Diagnosis not present

## 2023-08-05 DIAGNOSIS — M25672 Stiffness of left ankle, not elsewhere classified: Secondary | ICD-10-CM
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
## 2023-08-12 ENCOUNTER — Encounter: Payer: Self-pay | Admitting: Family Medicine

## 2023-08-12 ENCOUNTER — Ambulatory Visit: Payer: Medicare Other | Admitting: Family Medicine

## 2023-08-12 VITALS — BP 150/84 | HR 69 | Ht 60.0 in | Wt 119.0 lb

## 2023-08-12 DIAGNOSIS — M7662 Achilles tendinitis, left leg: Secondary | ICD-10-CM | POA: Diagnosis not present

## 2023-08-12 NOTE — Patient Instructions (Signed)
Thank you for coming in today.   Continue to work on home exercises.   I think the arch support you have is good.  I could arrange for custom orthotics if needed.   Recheck as needed with me.

## 2023-08-12 NOTE — Progress Notes (Signed)
   I, Stevenson Clinch, CMA acting as a scribe for Clementeen Graham, MD.  Carrie Page is a 89 y.o. female who presents to [facility] today for f/u L Achilles tendinopathy. Pt was last seen by Dr. Denyse Amass on 07/01/23 and was referred to PT, completing 5 visits.   Today, pt reports significant improvement of sx with PT, 75% improvement. ROM has improved. Continues to have mild pain.   Her last scheduled visit with physical therapy is scheduled for tomorrow.  Dx imaging: 05/14/23 L foot XR   Pertinent review of systems: No fevers or chills  Relevant historical information: History of colon cancer   Exam:  BP (!) 150/84   Pulse 69   Ht 5' (1.524 m)   Wt 119 lb (54 kg)   SpO2 98%   BMI 23.24 kg/m  General: Well Developed, well nourished, and in no acute distress.   MSK: Bilateral foot and ankle with mild pronation visible.  Nontender normal motion.  Inspection of her shoes show well-made shoes with good after market arch supporting insoles.     Assessment and Plan: 89 y.o. female with foot and ankle pain thought to be primarily Achilles tendinitis.  She likely does have some posterior tibialis tendinitis at times.  Overall doing pretty well with home exercise and PT.  She has her last scheduled visit with physical therapy tomorrow.  Happy to extended the patient her physical therapy thinks is necessary.  Happy to reauthorize in the future.  Her foot where it looked to be pretty well-made.  She could potentially get some benefit from custom orthotics and I am happy to refer to podiatry for that if needed.  Check back as needed.   PDMP not reviewed this encounter. No orders of the defined types were placed in this encounter.  No orders of the defined types were placed in this encounter.    Discussed warning signs or symptoms. Please see discharge instructions. Patient expresses understanding.   The above documentation has been reviewed and is  accurate and complete Clementeen Graham, M.D.

## 2023-08-13 ENCOUNTER — Ambulatory Visit: Payer: Medicare Other | Attending: Family Medicine | Admitting: Physical Therapy

## 2023-08-13 ENCOUNTER — Encounter: Payer: Self-pay | Admitting: Physical Therapy

## 2023-08-13 DIAGNOSIS — M25672 Stiffness of left ankle, not elsewhere classified: Secondary | ICD-10-CM | POA: Diagnosis not present

## 2023-08-13 DIAGNOSIS — R262 Difficulty in walking, not elsewhere classified: Secondary | ICD-10-CM | POA: Diagnosis not present

## 2023-08-13 DIAGNOSIS — M25572 Pain in left ankle and joints of left foot: Secondary | ICD-10-CM | POA: Diagnosis not present
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
## 2023-11-18 ENCOUNTER — Ambulatory Visit (INDEPENDENT_AMBULATORY_CARE_PROVIDER_SITE_OTHER): Payer: Medicare Other

## 2023-11-18 VITALS — Ht 60.0 in | Wt 119.0 lb

## 2023-11-18 DIAGNOSIS — Z0000 Encounter for general adult medical examination without abnormal findings: Secondary | ICD-10-CM

## 2023-11-18 NOTE — Patient Instructions (Signed)
Ms. Leyden , Thank you for taking time to come for your Medicare Wellness Visit. I appreciate your ongoing commitment to your health goals. Please review the following plan we discussed and let me know if I can assist you in the future.   Referrals/Orders/Follow-Ups/Clinician Recommendations: Keep up the good work and Happy holidays to you and your family.  This is a list of the screening recommended for you and due dates:  Health Maintenance  Topic Date Due   COVID-19 Vaccine (3 - Moderna risk series) 12/04/2023*   Flu Shot  03/08/2024*   Zoster (Shingles) Vaccine (1 of 2) 03/08/2024*   Eye exam for diabetics  12/24/2023   Complete foot exam   05/26/2024   Medicare Annual Wellness Visit  11/17/2024   DTaP/Tdap/Td vaccine (2 - Td or Tdap) 03/19/2026   Pneumonia Vaccine  Completed   DEXA scan (bone density measurement)  Completed   HPV Vaccine  Aged Out  *Topic was postponed. The date shown is not the original due date.    Advanced directives: (Copy Requested) Please bring a copy of your health care power of attorney and living will to the office to be added to your chart at your convenience.  Next Medicare Annual Wellness Visit scheduled for next year: Yes

## 2023-11-18 NOTE — Progress Notes (Signed)
Subjective:   Carrie Page is a 89 y.o. female who presents for Medicare Annual (Subsequent) preventive examination.  Visit Complete: Virtual I connected with  Boris Sharper on 11/18/23 by a audio enabled telemedicine application and verified that I am speaking with the correct person using two identifiers.  Patient Location: Home  Provider Location: Home Office  I discussed the limitations of evaluation and management by telemedicine. The patient expressed understanding and agreed to proceed.  Vital Signs: Because this visit was a virtual/telehealth visit, some criteria may be missing or patient reported. Any vitals not documented were not able to be obtained and vitals that have been documented are patient reported.   Cardiac Risk Factors include: advanced age (>74men, >2 women);hypertension;dyslipidemia;Other (see comment), Risk factor comments: colon cancer, Osteopenia     Objective:    Today's Vitals   11/18/23 0822  Weight: 119 lb (54 kg)  Height: 5' (1.524 m)   Body mass index is 23.24 kg/m.     11/18/2023    8:34 AM 07/09/2023    2:32 PM 05/14/2022   12:34 PM 05/09/2021    9:49 AM 01/04/2019   12:18 PM 06/19/2017    2:26 PM 12/30/2016   12:34 PM  Advanced Directives  Does Patient Have a Medical Advance Directive? Yes No Yes Yes No Yes Yes  Type of Estate agent of [place];Living will  Healthcare Power of [employer] Power of [employer] Power of [place];Living will Healthcare Power of Attorney  Does patient want to make changes to medical advance directive?    No - Patient declined     Copy of Healthcare Power of Attorney in Chart? No - copy requested  No - copy requested No - copy requested  No - copy requested No - copy requested  Would patient like information on creating a medical advance directive?  No - Patient declined   No - Patient declined      Current Medications (verified) Outpatient Encounter Medications as  of 11/18/2023  Medication Sig   aspirin EC 81 MG tablet Take 81 mg by mouth daily.   candesartan (ATACAND) 32 MG tablet Take 1 tablet (32 mg total) by mouth daily.   Cholecalciferol (VITAMIN D3) 1000 UNITS CAPS Take 1 capsule by mouth daily.   diazepam (VALIUM) 5 MG tablet TAKE ONE TABLET BY MOUTH ONCE DAILY AS NEEDED FOR ANXIETY   MULTIPLE VITAMIN PO Take 1 tablet by mouth daily.   vitamin B-12 (CYANOCOBALAMIN) 1000 MCG tablet Take 1 tablet (1,000 mcg total) by mouth daily.   No facility-administered encounter medications on file as of 11/18/2023.    Allergies (verified) Bactrim [sulfamethoxazole-trimethoprim], Lovastatin, Nitrofurantoin, Penicillins, Pravastatin, and Naproxen   History: Past Medical History:  Diagnosis Date   Allergic rhinitis, cause unspecified 01/28/2012   Allergy    Anemia    Anxiety 01/28/2012   Arthritis    Cecal cancer (HCC)    Degenerative joint disease 01/28/2012   Depression    History of nonmelanoma skin cancer    Hypertension    Impaired glucose tolerance    Osteoporosis    RT SHOULDER   PONV (postoperative nausea and vomiting)    Past Surgical History:  Procedure Laterality Date   ABDOMINAL HYSTERECTOMY     APPENDECTOMY     BACK SURGERY  2010   MICRODCISCECTOMY   CHOLECYSTECTOMY     LAPAROSCOPIC PARTIAL COLECTOMY N/A 06/20/2014   Procedure: LAPAROSCOPIC ASSISTED RIGHT PARTIAL COLECTOMY;  Surgeon: Romie Levee, MD;  Location: [facility];  Service: General;  Laterality: N/A;   TONSILLECTOMY     Family History  Problem Relation Age of Onset   Arthritis Other    Heart disease Other    Stroke Other    Colon cancer Father        unsure age   Social History   Socioeconomic History   Marital status: Widowed    Spouse name: Not on file   Number of children: 3   Years of education: Not on file   Highest education level: Not on file  Occupational History   Occupation: retired  Tobacco Use   Smoking status: Never   Smokeless tobacco: Never   Vaping Use   Vaping status: Never Used  Substance and Sexual Activity   Alcohol use: No    Alcohol/week: 0.0 standard drinks of alcohol   Drug use: No   Sexual activity: Not Currently  Other Topics Concern   Not on file  Social History Narrative   Lives alone   Social Determinants of Health   Financial Resource Strain: Medium Risk (11/18/2023)   Overall Financial Resource Strain (CARDIA)    Difficulty of Paying Living Expenses: Somewhat hard  Food Insecurity: No Food Insecurity (11/18/2023)   Hunger Vital Sign    Worried About Running Out of Food in the Last Year: Never true    Ran Out of Food in the Last Year: Never true  Transportation Needs: No Transportation Needs (11/18/2023)   PRAPARE - Administrator, Civil Service (Medical): No    Lack of Transportation (Non-Medical): No  Physical Activity: Sufficiently Active (11/18/2023)   Exercise Vital Sign    Days of Exercise per Week: 7 days    Minutes of Exercise per Session: 30 min  Stress: Stress Concern Present (11/18/2023)   [employer] of Occupational Health - Occupational Stress Questionnaire    Feeling of Stress : Rather much  Social Connections: Moderately Integrated (11/18/2023)   Social Connection and Isolation Panel [NHANES]    Frequency of Communication with Friends and Family: More than three times a week    Frequency of Social Gatherings with Friends and Family: Once a week    Attends Religious Services: More than 4 times per year    Active Member of [employer] or Organizations: Yes    Attends Banker Meetings: Never    Marital Status: Widowed    Tobacco Counseling Counseling given: Not Answered   Clinical Intake:  Pre-visit preparation completed: Yes  Pain : No/denies pain     BMI - recorded: 23.24 Nutritional Risks: None  How often do you need to have someone help you when you read instructions, pamphlets, or other written materials from your doctor or pharmacy?: 1 -  Never  Interpreter Needed?: No  Information entered by :: Alexiss Iturralde, RMA   Activities of Daily Living    11/18/2023    8:30 AM  In your present state of health, do you have any difficulty performing the following activities:  Hearing? 1  Comment some hearing loss  Vision? 0  Difficulty concentrating or making decisions? 0  Walking or climbing stairs? 0  Dressing or bathing? 0  Doing errands, shopping? 0  Preparing Food and eating ? N  Using the Toilet? N  In the past six months, have you accidently leaked urine? N  Do you have problems with loss of bowel control? N  Managing your Medications? N  Managing your Finances? N  Housekeeping or managing  your Housekeeping? N    Patient Care Team: Corwin Levins, MD as PCP - General (Internal Medicine) Mateo Flow, MD as Consulting Physician (Ophthalmology)  Indicate any recent Medical Services you may have received from other than [facility] providers in the past year (date may be approximate).     Assessment:   This is a routine wellness examination for Carrie Page.  Hearing/Vision screen Hearing Screening - Comments:: Some hearing loss Vision Screening - Comments:: Wears eyeglasses   Goals Addressed             This Visit's Progress    Patient Stated   On track    To maintain my current health status by continuing to eat healthy, stay physically active and socially active.      Depression Screen    11/18/2023    8:40 AM 05/27/2023   10:51 AM 05/14/2022   12:39 PM 05/09/2022   11:36 AM 11/08/2021   10:46 AM 05/09/2021   10:13 AM 05/24/2020   12:07 PM  PHQ 2/9 Scores  PHQ - 2 Score 0 0 1 1 0 0 0  PHQ- 9 Score 0          Fall Risk    11/18/2023    8:34 AM 05/27/2023   10:51 AM 05/14/2023   11:27 AM 05/14/2022   12:36 PM 05/09/2022   11:37 AM  Fall Risk   Falls in the past year? 0 0 0 0 0  Number falls in past yr: 0 0 0 0 0  Injury with Fall? 0 0 0 0 0  Risk for fall due to : No Fall Risks No Fall Risks No Fall Risks  No Fall Risks No Fall Risks  Follow up Falls prevention discussed;Falls evaluation completed Falls evaluation completed Falls evaluation completed Falls evaluation completed Falls evaluation completed    MEDICARE RISK AT HOME: Medicare Risk at Home Any stairs in or around the home?: No Home free of loose throw rugs in walkways, pet beds, electrical cords, etc?: Yes Adequate lighting in your home to reduce risk of falls?: Yes Life alert?: No Use of a cane, walker or w/c?: No Grab bars in the bathroom?: No Shower chair or bench in shower?: No Elevated toilet seat or a handicapped toilet?: No  TIMED UP AND GO:  Was the test performed?  No    Cognitive Function:    06/19/2017    2:29 PM  MMSE - Mini Mental State Exam  Orientation to time 5  Orientation to Place 5  Registration 3  Attention/ Calculation 4  Recall 2  Language- name 2 objects 2  Language- repeat 1  Language- follow 3 step command 3  Language- read & follow direction 1  Write a sentence 1  Copy design 1  Total score 28        11/18/2023    8:35 AM 05/14/2022   12:44 PM  6CIT Screen  What Year? 0 points 0 points  What month? 0 points 0 points  What time? 0 points 0 points  Count back from 20 0 points 0 points  Months in reverse 0 points 0 points  Repeat phrase 0 points 0 points  Total Score 0 points 0 points    Immunizations Immunization History  Administered Date(s) Administered   Fluad Quad(high Dose 65+) 11/18/2019, 11/08/2020, 11/08/2021   Influenza Split 09/03/2012   Influenza, High Dose Seasonal PF 11/04/2016, 10/22/2017, 12/23/2018   Influenza,inj,Quad PF,6+ Mos 09/09/2013, 09/15/2014, 09/07/2015   Moderna SARS-COV2 Booster Vaccination  12/13/2020   Moderna Sars-Covid-2 Vaccination 04/19/2020, 05/17/2020   Pneumococcal Conjugate-13 09/09/2013   Pneumococcal Polysaccharide-23 01/28/2012   Tdap 03/19/2016    TDAP status: Up to date  Flu Vaccine status: Due, Education has been provided  regarding the importance of this vaccine. Advised may receive this vaccine at local pharmacy or Health Dept. Aware to provide a copy of the vaccination record if obtained from local pharmacy or Health Dept. Verbalized acceptance and understanding.  Pneumococcal vaccine status: Up to date  Covid-19 vaccine status: Declined, Education has been provided regarding the importance of this vaccine but patient still declined. Advised may receive this vaccine at local pharmacy or Health Dept.or vaccine clinic. Aware to provide a copy of the vaccination record if obtained from local pharmacy or Health Dept. Verbalized acceptance and understanding.  Qualifies for Shingles Vaccine? Yes   Zostavax completed No   Shingrix Completed?: No.    Education has been provided regarding the importance of this vaccine. Patient has been advised to call insurance company to determine out of pocket expense if they have not yet received this vaccine. Advised may also receive vaccine at local pharmacy or Health Dept. Verbalized acceptance and understanding.  Screening Tests Health Maintenance  Topic Date Due   COVID-19 Vaccine (3 - Moderna risk series) 12/04/2023 (Originally 01/10/2021)   INFLUENZA VACCINE  03/08/2024 (Originally 07/10/2023)   Zoster Vaccines- Shingrix (1 of 2) 03/08/2024 (Originally 10/12/1947)   OPHTHALMOLOGY EXAM  12/24/2023   FOOT EXAM  05/26/2024   Medicare Annual Wellness (AWV)  11/17/2024   DTaP/Tdap/Td (2 - Td or Tdap) 03/19/2026   Pneumonia Vaccine 36+ Years old  Completed   DEXA SCAN  Completed   HPV VACCINES  Aged Out    Health Maintenance  There are no preventive care reminders to display for this patient.   Colorectal cancer screening: No longer required.   Mammogram status: No longer required due to age.  Bone Density status: Completed 07/22/2017. Results reflect: Bone density results: OSTEOPENIA. Repeat every 2 years.  Lung Cancer Screening: (Low Dose CT Chest recommended if Age  46-80 years, 20 pack-year currently smoking OR have quit w/in 15years.) does not qualify.   Lung Cancer Screening Referral: N/A  Additional Screening:  Hepatitis C Screening: does not qualify;   Vision Screening: Recommended annual ophthalmology exams for early detection of glaucoma and other disorders of the eye. Is the patient up to date with their annual eye exam?  Yes  Who is the provider or what is the name of the office in which the patient attends annual eye exams? Dr. Elmer Picker If pt is not established with a provider, would they like to be referred to a provider to establish care? No .   Dental Screening: Recommended annual dental exams for proper oral hygiene   Community Resource Referral / Chronic Care Management: CRR required this visit?  No   CCM required this visit?  No     Plan:     I have personally reviewed and noted the following in the patient's chart:   Medical and social history Use of alcohol, tobacco or illicit drugs  Current medications and supplements including opioid prescriptions. Patient is not currently taking opioid prescriptions. Functional ability and status Nutritional status Physical activity Advanced directives List of other physicians Hospitalizations, surgeries, and ER visits in previous 12 months Vitals Screenings to include cognitive, depression, and falls Referrals and appointments  In addition, I have reviewed and discussed with patient certain preventive protocols, quality metrics, and  best practice recommendations. A written personalized care plan for preventive services as well as general preventive health recommendations were provided to patient.     Mercedes [place] L Shiva Karis, CMA   11/18/2023   After Visit Summary: (Mail) Due to this being a telephonic visit, the after visit summary with patients personalized plan was offered to patient via mail   Nurse Notes: Patient is due for a Flu vaccine and a Shingrix vaccine.  She is up to date  on health maintenance with no concerns to address today.

## 2023-11-19 ENCOUNTER — Ambulatory Visit: Payer: Medicare Other | Admitting: Internal Medicine

## 2023-12-08 ENCOUNTER — Encounter: Payer: Self-pay | Admitting: Internal Medicine

## 2023-12-08 ENCOUNTER — Ambulatory Visit (INDEPENDENT_AMBULATORY_CARE_PROVIDER_SITE_OTHER): Payer: Medicare Other | Admitting: Internal Medicine

## 2023-12-08 VITALS — BP 144/82 | HR 70 | Temp 98.1°F | Ht 60.0 in | Wt 119.0 lb

## 2023-12-08 DIAGNOSIS — I10 Essential (primary) hypertension: Secondary | ICD-10-CM

## 2023-12-08 DIAGNOSIS — E538 Deficiency of other specified B group vitamins: Secondary | ICD-10-CM

## 2023-12-08 DIAGNOSIS — R7302 Impaired glucose tolerance (oral): Secondary | ICD-10-CM | POA: Diagnosis not present

## 2023-12-08 DIAGNOSIS — E785 Hyperlipidemia, unspecified: Secondary | ICD-10-CM

## 2023-12-08 MED ORDER — AMLODIPINE BESYLATE 5 MG PO TABS: 5.0000 mg | ORAL_TABLET | Freq: Every day | ORAL | 3 refills | Status: DC

## 2023-12-08 NOTE — Patient Instructions (Signed)
Please take all new medication as prescribed  - the amlodipine 5 mg per day  Please continue all other medications as before  Please have the pharmacy call with any other refills you may need.  Please continue your efforts at being more active, low cholesterol diet, and weight control..  Please keep your appointments with your specialists as you may have planned  Please make an Appointment to return in 6 months, or sooner if needed

## 2023-12-09 ENCOUNTER — Encounter: Payer: Self-pay | Admitting: Internal Medicine

## 2023-12-09 NOTE — Assessment & Plan Note (Signed)
 Lab Results  Component Value Date   LDLCALC 106 (H) 05/27/2023   Uncontrolled, for lower chol diet, declines statin

## 2023-12-09 NOTE — Assessment & Plan Note (Signed)
Lab Results  Component Value Date   VITAMINB12 444 05/27/2023   Stable, cont oral replacement - b12 1000 mcg qd

## 2023-12-09 NOTE — Assessment & Plan Note (Signed)
Lab Results  Component Value Date   HGBA1C 6.0 05/27/2023   Stable, pt to continue current medical treatment  - diet, wt control

## 2023-12-09 NOTE — Assessment & Plan Note (Signed)
 BP Readings from Last 3 Encounters:  12/08/23 (!) 144/82  08/12/23 (!) 150/84  07/01/23 (!) 192/78   uncontrolled, pt to continue medical treatment atacand 32 every day and add norvasc 5 qd

## 2024-02-27 DIAGNOSIS — H3562 Retinal hemorrhage, left eye: Secondary | ICD-10-CM | POA: Diagnosis not present

## 2024-02-27 DIAGNOSIS — H26491 Other secondary cataract, right eye: Secondary | ICD-10-CM | POA: Diagnosis not present

## 2024-02-27 DIAGNOSIS — H40013 Open angle with borderline findings, low risk, bilateral: Secondary | ICD-10-CM | POA: Diagnosis not present

## 2024-02-27 DIAGNOSIS — H04123 Dry eye syndrome of bilateral lacrimal glands: Secondary | ICD-10-CM | POA: Diagnosis not present

## 2024-02-27 DIAGNOSIS — H524 Presbyopia: Secondary | ICD-10-CM | POA: Diagnosis not present

## 2024-06-07 ENCOUNTER — Ambulatory Visit (INDEPENDENT_AMBULATORY_CARE_PROVIDER_SITE_OTHER): Payer: Medicare Other | Admitting: Internal Medicine

## 2024-06-07 ENCOUNTER — Ambulatory Visit: Payer: Self-pay | Admitting: Internal Medicine

## 2024-06-07 ENCOUNTER — Ambulatory Visit (INDEPENDENT_AMBULATORY_CARE_PROVIDER_SITE_OTHER)

## 2024-06-07 ENCOUNTER — Encounter: Payer: Self-pay | Admitting: Internal Medicine

## 2024-06-07 VITALS — BP 132/74 | HR 74 | Temp 98.4°F | Ht 60.0 in | Wt 123.0 lb

## 2024-06-07 DIAGNOSIS — E785 Hyperlipidemia, unspecified: Secondary | ICD-10-CM

## 2024-06-07 DIAGNOSIS — M79671 Pain in right foot: Secondary | ICD-10-CM | POA: Diagnosis not present

## 2024-06-07 DIAGNOSIS — I10 Essential (primary) hypertension: Secondary | ICD-10-CM

## 2024-06-07 DIAGNOSIS — D509 Iron deficiency anemia, unspecified: Secondary | ICD-10-CM

## 2024-06-07 DIAGNOSIS — E559 Vitamin D deficiency, unspecified: Secondary | ICD-10-CM

## 2024-06-07 DIAGNOSIS — Z0001 Encounter for general adult medical examination with abnormal findings: Secondary | ICD-10-CM | POA: Diagnosis not present

## 2024-06-07 DIAGNOSIS — E538 Deficiency of other specified B group vitamins: Secondary | ICD-10-CM | POA: Diagnosis not present

## 2024-06-07 DIAGNOSIS — M19071 Primary osteoarthritis, right ankle and foot: Secondary | ICD-10-CM | POA: Diagnosis not present

## 2024-06-07 DIAGNOSIS — G8929 Other chronic pain: Secondary | ICD-10-CM | POA: Diagnosis not present

## 2024-06-07 DIAGNOSIS — R7302 Impaired glucose tolerance (oral): Secondary | ICD-10-CM | POA: Diagnosis not present

## 2024-06-07 LAB — CBC WITH DIFFERENTIAL/PLATELET
Basophils Absolute: 0.1 10*3/uL (ref 0.0–0.1)
Basophils Relative: 1.2 % (ref 0.0–3.0)
Eosinophils Absolute: 0.2 10*3/uL (ref 0.0–0.7)
Eosinophils Relative: 3.5 % (ref 0.0–5.0)
HCT: 41.4 % (ref 36.0–46.0)
Hemoglobin: 13.6 g/dL (ref 12.0–15.0)
Lymphocytes Relative: 38.9 % (ref 12.0–46.0)
Lymphs Abs: 2.7 10*3/uL (ref 0.7–4.0)
MCHC: 32.9 g/dL (ref 30.0–36.0)
MCV: 87.9 fl (ref 78.0–100.0)
Monocytes Absolute: 0.4 10*3/uL (ref 0.1–1.0)
Monocytes Relative: 6.4 % (ref 3.0–12.0)
Neutro Abs: 3.4 10*3/uL (ref 1.4–7.7)
Neutrophils Relative %: 50 % (ref 43.0–77.0)
Platelets: 198 10*3/uL (ref 150.0–400.0)
RBC: 4.71 Mil/uL (ref 3.87–5.11)
RDW: 13.8 % (ref 11.5–15.5)
WBC: 6.9 10*3/uL (ref 4.0–10.5)

## 2024-06-07 LAB — BASIC METABOLIC PANEL WITH GFR
BUN: 25 mg/dL — ABNORMAL HIGH (ref 6–23)
CO2: 26 meq/L (ref 19–32)
Calcium: 9.7 mg/dL (ref 8.4–10.5)
Chloride: 101 meq/L (ref 96–112)
Creatinine, Ser: 0.61 mg/dL (ref 0.40–1.20)
GFR: 76.94 mL/min (ref >60.00)
Glucose, Bld: 113 mg/dL — ABNORMAL HIGH (ref 70–99)
Potassium: 4.4 meq/L (ref 3.5–5.1)
Sodium: 136 meq/L (ref 135–145)

## 2024-06-07 LAB — HEPATIC FUNCTION PANEL
ALT: 13 U/L (ref 0–35)
AST: 22 U/L (ref 0–37)
Albumin: 4.4 g/dL (ref 3.5–5.2)
Alkaline Phosphatase: 83 U/L (ref 39–117)
Bilirubin, Direct: 0.1 mg/dL (ref 0.0–0.3)
Total Bilirubin: 0.5 mg/dL (ref 0.2–1.2)
Total Protein: 7.8 g/dL (ref 6.0–8.3)

## 2024-06-07 LAB — URINALYSIS, ROUTINE W REFLEX MICROSCOPIC
Bilirubin Urine: NEGATIVE
Ketones, ur: NEGATIVE
Nitrite: NEGATIVE
Specific Gravity, Urine: 1.01 (ref 1.000–1.030)
Total Protein, Urine: NEGATIVE
Urine Glucose: NEGATIVE
Urobilinogen, UA: 0.2 (ref 0.0–1.0)
pH: 6 (ref 5.0–8.0)

## 2024-06-07 LAB — TSH: TSH: 1.85 u[IU]/mL (ref 0.35–5.50)

## 2024-06-07 LAB — IBC PANEL
Iron: 119 ug/dL (ref 42–145)
Saturation Ratios: 29.1 % (ref 20.0–50.0)
TIBC: 408.8 ug/dL (ref 250.0–450.0)
Transferrin: 292 mg/dL (ref 212.0–360.0)

## 2024-06-07 LAB — FERRITIN: Ferritin: 74.1 ng/mL (ref 10.0–291.0)

## 2024-06-07 LAB — LIPID PANEL
Cholesterol: 209 mg/dL — ABNORMAL HIGH (ref 0–200)
HDL: 74.5 mg/dL (ref >39.00)
LDL Cholesterol: 108 mg/dL — ABNORMAL HIGH (ref 0–99)
NonHDL: 134.65
Total CHOL/HDL Ratio: 3
Triglycerides: 131 mg/dL (ref 0.0–149.0)
VLDL: 26.2 mg/dL (ref 0.0–40.0)

## 2024-06-07 LAB — VITAMIN D 25 HYDROXY (VIT D DEFICIENCY, FRACTURES): VITD: 36.61 ng/mL (ref 30.00–100.00)

## 2024-06-07 LAB — HEMOGLOBIN A1C: Hgb A1c MFr Bld: 6.3 % (ref 4.6–6.5)

## 2024-06-07 LAB — VITAMIN B12: Vitamin B-12: 832 pg/mL (ref 211–911)

## 2024-06-07 NOTE — Patient Instructions (Addendum)
 Please have your Shingrix (shingles) shots done at your local pharmacy.  Please continue all other medications as before, and refills have been done if requested.  Please have the pharmacy call with any other refills you may need.  Please continue your efforts at being more active, low cholesterol diet, and weight control.  You are otherwise up to date with prevention measures today.  Please keep your appointments with your specialists as you may have planned  Please go to the XRAY Department in the first floor for the x-ray testing  Please go to the LAB at the blood drawing area for the tests to be done  You will be contacted by phone if any changes need to be made immediately.  Otherwise, you will receive a letter about your results with an explanation, but please check with MyChart first.  Please make an Appointment to return in 6 months, or sooner if needed

## 2024-06-07 NOTE — Assessment & Plan Note (Signed)
 Lab Results  Component Value Date   VITAMINB12 832 06/07/2024   Stable, cont oral replacement - b12 1000 mcg qd

## 2024-06-07 NOTE — Assessment & Plan Note (Signed)
 Lab Results  Component Value Date   LDLCALC 108 (H) 06/07/2024   uncontrolled, pt for lower chol diet, declines statin

## 2024-06-07 NOTE — Assessment & Plan Note (Signed)

## 2024-06-07 NOTE — Assessment & Plan Note (Signed)
 Can't r/o stress fx - for xray right foot

## 2024-06-07 NOTE — Assessment & Plan Note (Signed)
No overt bleeding, for f/u iron lab

## 2024-06-07 NOTE — Assessment & Plan Note (Signed)
 BP Readings from Last 3 Encounters:  06/07/24 132/74  12/08/23 (!) 144/82  08/12/23 (!) 150/84   Stable, pt to continue medical treatment norvasc  5 every day, atacand  32 qd

## 2024-06-07 NOTE — Progress Notes (Signed)
 Patient ID: Carrie Page, female   DOB: 1928/01/13, 89 y.o.   MRN: [id number]         Chief Complaint:: wellness exam and dizziness, right mid foot pain, iron deficency, hyperglycemia, htn, hld, low b12       HPI:  Carrie Page is a 89 y.o. female here for wellness exam; for shingrix at pharmacy, o/w up to date                        Also Pt denies chest pain, increased sob or doe, wheezing, orthopnea, PND, increased LE swelling, palpitations, dizziness or syncope.   Pt denies polydipsia, polyuria, or new focal neuro s/s.    Pt denies fever, wt loss, night sweats, loss of appetite, or other constitutional symptoms  Did suffer a pop and pain to right foot mid dorsal with mild swelling x 1 wk, asks for xray.     Wt Readings from Last 3 Encounters:  06/07/24 123 lb (55.8 kg)  12/08/23 119 lb (54 kg)  11/18/23 119 lb (54 kg)   BP Readings from Last 3 Encounters:  06/07/24 132/74  12/08/23 (!) 144/82  08/12/23 (!) 150/84   Immunization History  Administered Date(s) Administered   Fluad Quad(high Dose 65+) 11/18/2019, 11/08/2020, 11/08/2021   Influenza Split 09/03/2012   Influenza, High Dose Seasonal PF 11/04/2016, 10/22/2017, 12/23/2018   Influenza,inj,Quad PF,6+ Mos 09/09/2013, 09/15/2014, 09/07/2015   Moderna SARS-COV2 Booster Vaccination 12/13/2020   Moderna Sars-Covid-2 Vaccination 04/19/2020, 05/17/2020   Pneumococcal Conjugate-13 09/09/2013   Pneumococcal Polysaccharide-23 01/28/2012   Tdap 03/19/2016   Health Maintenance Due  Topic Date Due   Zoster Vaccines- Shingrix (1 of 2) Never done      Past Medical History:  Diagnosis Date   Allergic rhinitis, cause unspecified 01/28/2012   Allergy    Anemia    Anxiety 01/28/2012   Arthritis    Cecal cancer (HCC)    Degenerative joint disease 01/28/2012   Depression    History of nonmelanoma skin cancer    Hypertension    Impaired glucose tolerance    Osteoporosis    RT SHOULDER   PONV (postoperative nausea and  vomiting)    Past Surgical History:  Procedure Laterality Date   ABDOMINAL HYSTERECTOMY     APPENDECTOMY     BACK SURGERY  2010   MICRODCISCECTOMY   CHOLECYSTECTOMY     LAPAROSCOPIC PARTIAL COLECTOMY N/A 06/20/2014   Procedure: LAPAROSCOPIC ASSISTED RIGHT PARTIAL COLECTOMY;  Surgeon: Bernarda Ned, MD;  Location: [facility];  Service: General;  Laterality: N/A;   TONSILLECTOMY      reports that she has never smoked. She has never used smokeless tobacco. She reports that she does not drink alcohol and does not use drugs. family history includes Arthritis in an other family member; Colon cancer in her father; Heart disease in an other family member; Stroke in an other family member. Allergies  Allergen Reactions   Bactrim  [Sulfamethoxazole -Trimethoprim ] Itching, Rash and Other (See Comments)    Rash, itching, diaphoretic   Lovastatin  Other (See Comments)    Cognitive slowing and myalgias   Nitrofurantoin  Other (See Comments)    GI upset   Penicillins Itching   Pravastatin  Other (See Comments)    Myalgias, increased dreams   Naproxen Rash   Current Outpatient Medications on File Prior to Visit  Medication Sig Dispense Refill   amLODipine  (NORVASC ) 5 MG tablet Take 1 tablet (5 mg total) by mouth daily. 90 tablet  3   aspirin  EC 81 MG tablet Take 81 mg by mouth daily.     candesartan  (ATACAND ) 32 MG tablet Take 1 tablet (32 mg total) by mouth daily. 90 tablet 3   Cholecalciferol (VITAMIN D3) 1000 UNITS CAPS Take 1 capsule by mouth daily.     diazepam  (VALIUM ) 5 MG tablet TAKE ONE TABLET BY MOUTH ONCE DAILY AS NEEDED FOR ANXIETY 30 tablet 2   MULTIPLE VITAMIN PO Take 1 tablet by mouth daily.     vitamin B-12 (CYANOCOBALAMIN ) 1000 MCG tablet Take 1 tablet (1,000 mcg total) by mouth daily. 90 tablet 3   No current facility-administered medications on file prior to visit.        ROS:  All others reviewed and negative.  Objective        PE:  BP 132/74 (BP Location: Left Arm, Patient  Position: Sitting, Cuff Size: Normal)   Pulse 74   Temp 98.4 F (36.9 C) (Oral)   Ht 5' (1.524 m)   Wt 123 lb (55.8 kg)   SpO2 100%   BMI 24.02 kg/m                 Constitutional: Pt appears in NAD               HENT: Head: NCAT.                Right Ear: External ear normal.                 Left Ear: External ear normal.                Eyes: . Pupils are equal, round, and reactive to light. Conjunctivae and EOM are normal               Nose: without d/c or deformity               Neck: Neck supple. Gross normal ROM               Cardiovascular: Normal rate and regular rhythm.                 Pulmonary/Chest: Effort normal and breath sounds without rales or wheezing.                Abd:  Soft, NT, ND, + BS, no organomegaly               Neurological: Pt is alert. At baseline orientation, motor grossly intact               Skin: Skin is warm. No rashes, no other new lesions, LE edema - none               Psychiatric: Pt behavior is normal without agitation   Micro: none  Cardiac tracings I have personally interpreted today:  none  Pertinent Radiological findings (summarize): none   Lab Results  Component Value Date   WBC 6.9 06/07/2024   HGB 13.6 06/07/2024   HCT 41.4 06/07/2024   PLT 198.0 06/07/2024   GLUCOSE 113 (H) 06/07/2024   CHOL 209 (H) 06/07/2024   TRIG 131.0 06/07/2024   HDL 74.50 06/07/2024   LDLDIRECT 124.0 03/04/2013   LDLCALC 108 (H) 06/07/2024   ALT 13 06/07/2024   AST 22 06/07/2024   NA 136 06/07/2024   K 4.4 06/07/2024   CL 101 06/07/2024   CREATININE 0.61 06/07/2024   BUN 25 (H) 06/07/2024   CO2 26  06/07/2024   TSH 1.85 06/07/2024   HGBA1C 6.3 06/07/2024   Assessment/Plan:  Carrie Page is a 89 y.o. White or Caucasian [1] female with  has a past medical history of Allergic rhinitis, cause unspecified (01/28/2012), Allergy, Anemia, Anxiety (01/28/2012), Arthritis, Cecal cancer (HCC), Degenerative joint disease (01/28/2012), Depression, History  of nonmelanoma skin cancer, Hypertension, Impaired glucose tolerance, Osteoporosis, and PONV (postoperative nausea and vomiting).  Encounter for well adult exam with abnormal findings Age and sex appropriate education and counseling updated with regular exercise and diet Referrals for preventative services - none needed Immunizations addressed - for shingrix at pharmacy Smoking counseling  - none needed Evidence for depression or other mood disorder - none significant Most recent labs reviewed. I have personally reviewed and have noted: 1) the patient's medical and social history 2) The patient's current medications and supplements 3) The patient's height, weight, and BMI have been recorded in the chart   Right foot pain Can't r/o stress fx - for xray right foot  Iron deficiency anemia, unspecified No overt bleeding, for f/u iron lab  Impaired glucose tolerance Lab Results  Component Value Date   HGBA1C 6.3 06/07/2024   Stable, pt to continue current medical treatment  - diet, wt control   Hypertension BP Readings from Last 3 Encounters:  06/07/24 132/74  12/08/23 (!) 144/82  08/12/23 (!) 150/84   Stable, pt to continue medical treatment norvasc  5 every day, atacand  32 qd   Hyperlipidemia Lab Results  Component Value Date   LDLCALC 108 (H) 06/07/2024   uncontrolled, pt for lower chol diet, declines statin   B12 deficiency Lab Results  Component Value Date   VITAMINB12 832 06/07/2024   Stable, cont oral replacement - b12 1000 mcg qd  Followup: Return in about 6 months (around 12/07/2024).  Lynwood Rush, MD 06/07/2024 1:11 PM [facility]

## 2024-06-07 NOTE — Assessment & Plan Note (Signed)
 Lab Results  Component Value Date   HGBA1C 6.3 06/07/2024   Stable, pt to continue current medical treatment  - diet, wt control

## 2024-06-08 ENCOUNTER — Encounter: Payer: Self-pay | Admitting: Internal Medicine

## 2024-06-14 ENCOUNTER — Other Ambulatory Visit: Payer: Self-pay | Admitting: Internal Medicine

## 2024-06-14 MED ORDER — CANDESARTAN CILEXETIL 32 MG PO TABS: 32.0000 mg | ORAL_TABLET | Freq: Every day | ORAL | 3 refills | Status: AC

## 2024-06-14 NOTE — Telephone Encounter (Signed)
 Copied from CRM [id number]. Topic: Clinical - Medication Refill >> Jun 14, 2024  9:50 AM Carrie Page wrote: Medication: candesartan  (ATACAND ) 32 MG tablet  Has the patient contacted their pharmacy? No (Agent: If no, request that the patient contact the pharmacy for the refill. If patient does not wish to contact the pharmacy document the reason why and proceed with request.) (Agent: If yes, when and what did the pharmacy advise?)  This is the patient's preferred pharmacy:  [facility] [address], [place] - [address] [place] [address] Phone: [phone] Fax: [phone]  Is this the correct pharmacy for this prescription? Yes If no, delete pharmacy and type the correct one.   Has the prescription been filled recently? No, 06/23/2023  Is the patient out of the medication? Yes, 1 week left  Has the patient been seen for an appointment in the last year OR does the patient have an upcoming appointment? Yes  Can we respond through MyChart? No  Agent: Please be advised that Rx refills may take up to 3 business days. We ask that you follow-up with your pharmacy.

## 2024-06-30 ENCOUNTER — Other Ambulatory Visit: Payer: Self-pay

## 2024-06-30 ENCOUNTER — Emergency Department (HOSPITAL_COMMUNITY)

## 2024-06-30 ENCOUNTER — Encounter (HOSPITAL_COMMUNITY): Payer: Self-pay

## 2024-06-30 ENCOUNTER — Emergency Department (HOSPITAL_COMMUNITY)
Admission: EM | Admit: 2024-06-30 | Discharge: 2024-06-30 | Disposition: A | Discharge status: Home / Self Care | Attending: Emergency Medicine | Admitting: Emergency Medicine

## 2024-06-30 DIAGNOSIS — Z23 Encounter for immunization: Secondary | ICD-10-CM | POA: Insufficient documentation

## 2024-06-30 DIAGNOSIS — R220 Localized swelling, mass and lump, head: Secondary | ICD-10-CM | POA: Diagnosis not present

## 2024-06-30 DIAGNOSIS — Y92019 Unspecified place in single-family (private) house as the place of occurrence of the external cause: Secondary | ICD-10-CM | POA: Insufficient documentation

## 2024-06-30 DIAGNOSIS — Y9301 Activity, walking, marching and hiking: Secondary | ICD-10-CM | POA: Diagnosis not present

## 2024-06-30 DIAGNOSIS — W01119A Fall on same level from slipping, tripping and stumbling with subsequent striking against unspecified sharp object, initial encounter: Secondary | ICD-10-CM | POA: Insufficient documentation

## 2024-06-30 DIAGNOSIS — S01111A Laceration without foreign body of right eyelid and periocular area, initial encounter: Secondary | ICD-10-CM | POA: Diagnosis not present

## 2024-06-30 DIAGNOSIS — S0181XA Laceration without foreign body of other part of head, initial encounter: Secondary | ICD-10-CM | POA: Diagnosis not present

## 2024-06-30 DIAGNOSIS — Z7982 Long term (current) use of aspirin: Secondary | ICD-10-CM | POA: Diagnosis not present

## 2024-06-30 DIAGNOSIS — I6782 Cerebral ischemia: Secondary | ICD-10-CM | POA: Diagnosis not present

## 2024-06-30 DIAGNOSIS — S0990XA Unspecified injury of head, initial encounter: Secondary | ICD-10-CM | POA: Diagnosis not present

## 2024-06-30 MED ORDER — LIDOCAINE-EPINEPHRINE 2 %-1:100000 IJ SOLN
20.0000 mL | Freq: Once | INTRAMUSCULAR | Status: AC
  Administered 2024-06-30: 20 mL via INTRADERMAL
  Filled 2024-06-30: qty 1

## 2024-06-30 MED ORDER — TETANUS-DIPHTH-ACELL PERTUSSIS 5-2.5-18.5 LF-MCG/0.5 IM SUSY
0.5000 mL | PREFILLED_SYRINGE | Freq: Once | INTRAMUSCULAR | Status: AC
  Administered 2024-06-30: 0.5 mL via INTRAMUSCULAR
  Filled 2024-06-30: qty 0.5

## 2024-06-30 NOTE — ED Provider Notes (Signed)
 [place] EMERGENCY DEPARTMENT AT [facility] Provider Note   CSN: [id number] Arrival date & time: 06/30/24  1435     Patient presents with: Head Laceration   Carrie Page is a 89 y.o. female who presents emergency department with chief complaint of forehead laceration.  Patient states that she lost her footing and hit her head on her coffee table today suffered a laceration.  Her grandson brought her here.  She is unsure of her last tetanus vaccination.  She is not on any blood thinners.  She denies neck pain or loss of consciousness.      Head Laceration       Prior to Admission medications   Medication Sig Start Date End Date Taking? Authorizing Provider  amLODipine  (NORVASC ) 5 MG tablet Take 1 tablet (5 mg total) by mouth daily. 12/08/23 12/07/24  Norleen Lynwood ORN, MD  aspirin  EC 81 MG tablet Take 81 mg by mouth daily.    [provider]  candesartan  (ATACAND ) 32 MG tablet Take 1 tablet (32 mg total) by mouth daily. 06/14/24   Norleen Lynwood ORN, MD  Cholecalciferol (VITAMIN D3) 1000 UNITS CAPS Take 1 capsule by mouth daily.    [provider]  diazepam  (VALIUM ) 5 MG tablet TAKE ONE TABLET BY MOUTH ONCE DAILY AS NEEDED FOR ANXIETY 05/09/22   Norleen Lynwood ORN, MD  MULTIPLE VITAMIN PO Take 1 tablet by mouth daily.    [provider]  vitamin B-12 (CYANOCOBALAMIN ) 1000 MCG tablet Take 1 tablet (1,000 mcg total) by mouth daily. 11/08/21   Norleen Lynwood ORN, MD    Allergies: Bactrim  [sulfamethoxazole -trimethoprim ], Lovastatin , Nitrofurantoin , Penicillins, Pravastatin , and Naproxen    Review of Systems  Updated Vital Signs BP (!) 142/58 (BP Location: Left Arm)   Pulse 88   Temp 98.2 F (36.8 C) (Oral)   Resp 18   SpO2 99%   Physical Exam Vitals and nursing note reviewed.  Constitutional:      General: She is not in acute distress.    Appearance: She is well-developed. She is not diaphoretic.  HENT:     Head: Normocephalic.     Comments: 3-1/2 cm  laceration to the medial right forehead just next to the right eyebrow. Normal facial muscle movement and sensation.    Right Ear: External ear normal.     Left Ear: External ear normal.     Nose: Nose normal.     Mouth/Throat:     Mouth: Mucous membranes are moist.  Eyes:     General: No scleral icterus.    Extraocular Movements: Extraocular movements intact.     Conjunctiva/sclera: Conjunctivae normal.     Pupils: Pupils are equal, round, and reactive to light.  Cardiovascular:     Rate and Rhythm: Normal rate and regular rhythm.     Heart sounds: Normal heart sounds. No murmur heard.    No friction rub. No gallop.  Pulmonary:     Effort: Pulmonary effort is normal. No respiratory distress.     Breath sounds: Normal breath sounds.  Abdominal:     General: Bowel sounds are normal. There is no distension.     Palpations: Abdomen is soft. There is no mass.     Tenderness: There is no abdominal tenderness. There is no guarding.  Musculoskeletal:     Cervical back: Normal range of motion.  Skin:    General: Skin is warm and dry.  Neurological:     Mental Status: She is alert and  oriented to person, place, and time.     Cranial Nerves: No cranial nerve deficit.     Sensory: No sensory deficit.     Motor: No weakness.     Coordination: Coordination normal.     Gait: Gait normal.     Deep Tendon Reflexes: Reflexes normal.  Psychiatric:        Behavior: Behavior normal.     (all labs ordered are listed, but only abnormal results are displayed) Labs Reviewed - No data to display  EKG: None  Radiology: No results found.   .Laceration Repair  Date/Time: 06/30/2024 5:44 PM  Performed by: Arloa Chroman, PA-C Authorized by: Arloa Chroman, PA-C   Consent:    Consent obtained:  Verbal   Consent given by:  Patient   Risks discussed:  Need for additional repair, infection, pain, poor cosmetic result and retained foreign body   Alternatives discussed:  No  treatment Anesthesia:    Anesthesia method:  Local infiltration   Local anesthetic:  Lidocaine  2% WITH epi Laceration details:    Location:  Face   Face location:  R eyebrow   Length (cm):  3 Pre-procedure details:    Preparation:  Patient was prepped and draped in usual sterile fashion and imaging obtained to evaluate for foreign bodies Exploration:    Wound exploration: wound explored through full range of motion and entire depth of wound visualized     Wound extent: fascia not violated, no nerve damage, no tendon damage, no underlying fracture and no vascular damage   Treatment:    Area cleansed with:  Saline and povidone-iodine   Amount of cleaning:  Standard   Irrigation solution:  Sterile saline   Irrigation method:  Syringe and pressure wash Skin repair:    Repair method:  Sutures   Suture size:  5-0   Wound skin closure material used: vicryl rapide.   Number of sutures:  3 Approximation:    Approximation:  Close Repair type:    Repair type:  Simple Post-procedure details:    Dressing:  Bulky dressing and non-adherent dressing   Procedure completion:  Tolerated well, no immediate complications    Medications Ordered in the ED  lidocaine -EPINEPHrine  (XYLOCAINE  W/EPI) 2 %-1:100000 (with pres) injection 20 mL (20 mLs Intradermal Given 06/30/24 1631)  Tdap (BOOSTRIX) injection 0.5 mL (0.5 mLs Intramuscular Given 06/30/24 1631)                                    Medical Decision Making Amount and/or Complexity of Data Reviewed Radiology: ordered.  Risk Prescription drug management.   89 year old female who presents the emergency department with chief complaint of head injury.  I ordered visualized and interpreted CT head which shows no acute findings.  Patient with a eyebrow forehead laceration repaired with rapid dissolving sutures.  I have advised the patient on home care, reasons to seek immediate medical attention and reasons to follow-up.  She appears otherwise  appropriate for discharge at this time is ambulatory without neurologic deficits and appropriate for discharge Tdap updated.     Final diagnoses:  None    ED Discharge Orders     None          Arloa Chroman, PA-C 06/30/24 1747    Patsey Lot, MD 06/30/24 2300

## 2024-06-30 NOTE — Discharge Instructions (Signed)
 WOUND CARE   Keep area clean and dry for 24 hours. Do not remove bandage, if applied.  After 24 hours, remove bandage and wash wound gently with mild soap and warm water. Reapply a new bandage after cleaning wound, if directed.  Continue daily cleansing with soap and water until stitches/staples are removed.  Do not apply any ointments or creams to the wound while stitches/staples are in place, as this may cause delayed healing.  Seek medical careif you experience any of the following signs of infection: Swelling, redness, pus drainage, streaking, fever >101.0 F  Seek care if you experience excessive bleeding that does not stop after 15-20 minutes of constant, firm pressure.

## 2024-06-30 NOTE — ED Triage Notes (Addendum)
 Pt was walking in the living room and hit her foot on the coffee table, tripped, and fell, hitting her head. Pt has a laceration to her forehead between her eyes.

## 2024-11-18 ENCOUNTER — Ambulatory Visit: Payer: Medicare Other

## 2024-11-18 VITALS — Ht 60.0 in | Wt 123.0 lb

## 2024-11-18 DIAGNOSIS — Z0000 Encounter for general adult medical examination without abnormal findings: Secondary | ICD-10-CM | POA: Diagnosis not present

## 2024-11-18 NOTE — Patient Instructions (Addendum)
 Ms. Crumrine,  Thank you for taking the time for your Medicare Wellness Visit. I appreciate your continued commitment to your health goals. Please review the care plan we discussed, and feel free to reach out if I can assist you further.  Please note that Annual Wellness Visits do not include a physical exam. Some assessments may be limited, especially if the visit was conducted virtually. If needed, we may recommend an in-person follow-up with your provider.  Ongoing Care Seeing your primary care provider every 3 to 6 months helps us  monitor your health and provide consistent, personalized care.   Referrals If a referral was made during today's visit and you haven't received any updates within two weeks, please contact the referred provider directly to check on the status.  Recommended Screenings:  Health Maintenance  Topic Date Due   Zoster (Shingles) Vaccine (1 of 2) Never done   Flu Shot  07/09/2024   Eye exam for diabetics  02/26/2025   Complete foot exam   06/07/2025   Medicare Annual Wellness Visit  11/18/2025   DTaP/Tdap/Td vaccine (3 - Td or Tdap) 06/30/2034   Pneumococcal Vaccine for age over 10  Completed   Osteoporosis screening with Bone Density Scan  Completed   Meningitis B Vaccine  Aged Out   COVID-19 Vaccine  Discontinued       11/18/2024    9:34 AM  Advanced Directives  Does Patient Have a Medical Advance Directive? Yes  Type of Estate Agent of [place];Living will  Does patient want to make changes to medical advance directive? Yes (Inpatient - patient requests chaplain consult to change a medical advance directive)  Copy of Healthcare Power of Attorney in Chart? No - copy requested    Vision: Annual vision screenings are recommended for early detection of glaucoma, cataracts, and diabetic retinopathy. These exams can also reveal signs of chronic conditions such as diabetes and high blood pressure.  Dental: Annual dental screenings help  detect early signs of oral cancer, gum disease, and other conditions linked to overall health, including heart disease and diabetes.

## 2024-11-18 NOTE — Progress Notes (Signed)
 Chief Complaint  Patient presents with   Medicare Wellness     Subjective:   Carrie Page is a 89 y.o. female who presents for a Medicare Annual Wellness Visit.  Visit info / Clinical Intake: Medicare Wellness Visit Type:: Subsequent Annual Wellness Visit Persons participating in visit and providing information:: patient Medicare Wellness Visit Mode:: Telephone If telephone:: video declined Since this visit was completed virtually, some vitals may be partially provided or unavailable. Missing vitals are due to the limitations of the virtual format.: Documented vitals are patient reported If Telephone or Video please confirm:: I connected with patient using audio/video enable telemedicine. I verified patient identity with two identifiers, discussed telehealth limitations, and patient agreed to proceed. Patient Location:: Home Provider Location:: Office Interpreter Needed?: No Pre-visit prep was completed: yes AWV questionnaire completed by patient prior to visit?: no Living arrangements:: (!) lives alone Patient's Overall Health Status Rating: good Typical amount of pain: none Does pain affect daily life?: no Are you currently prescribed opioids?: no  Dietary Habits and Nutritional Risks How many meals a day?: 2 Eats fruit and vegetables daily?: yes Most meals are obtained by: preparing own meals In the last 2 weeks, have you had any of the following?: none Diabetic:: no  Functional Status Activities of Daily Living (to include ambulation/medication): Independent Ambulation: Independent with device- listed below Home Assistive Devices/Equipment: Dentures (specify type); Eyeglasses Medication Administration: Independent Home Management (perform basic housework or laundry): Independent Manage your own finances?: yes Primary transportation is: driving Concerns about vision?: no *vision screening is required for WTM* Concerns about hearing?: no  Fall Screening Falls in  the past year?: 1 Number of falls in past year: 0 (1) Was there an injury with Fall?: 1 (head injury) Fall Risk Category Calculator: 2 Patient Fall Risk Level: Moderate Fall Risk  Fall Risk Patient at Risk for Falls Due to: History of fall(s) Fall risk Follow up: Falls evaluation completed; Falls prevention discussed  Home and Transportation Safety: All rugs have non-skid backing?: N/A, no rugs All stairs or steps have railings?: N/A, no stairs Grab bars in the bathtub or shower?: (!) no Have non-skid surface in bathtub or shower?: yes Good home lighting?: yes Regular seat belt use?: yes Hospital stays in the last year:: no  Cognitive Assessment Difficulty concentrating, remembering, or making decisions? : no Will 6CIT or Mini Cog be Completed: yes What year is it?: 0 points What month is it?: 0 points Give patient an address phrase to remember (5 components): [address] [place], [place] About what time is it?: 0 points Count backwards from 20 to 1: 0 points Say the months of the year in reverse: 0 points Repeat the address phrase from earlier: 0 points 6 CIT Score: 0 points  Advance Directives (For Healthcare) Does Patient Have a Medical Advance Directive?: Yes Does patient want to make changes to medical advance directive?: Yes (Inpatient - patient requests chaplain consult to change a medical advance directive) Type of Advance Directive: Healthcare Power of [place]; Living will Copy of Healthcare Power of Attorney in Chart?: No - copy requested Copy of Living Will in Chart?: No - copy requested Would patient like information on creating a medical advance directive?: No - Patient declined  Reviewed/Updated  Reviewed/Updated: Reviewed All (Medical, Surgical, Family, Medications, Allergies, Care Teams, Patient Goals)    Allergies (verified) Bactrim  [sulfamethoxazole -trimethoprim ], Lovastatin , Nitrofurantoin , Penicillins, Pravastatin , and Naproxen   Current Medications  (verified) Outpatient Encounter Medications as of 11/18/2024  Medication Sig  amLODipine  (NORVASC ) 5 MG tablet Take 1 tablet (5 mg total) by mouth daily.   aspirin  EC 81 MG tablet Take 81 mg by mouth daily.   candesartan  (ATACAND ) 32 MG tablet Take 1 tablet (32 mg total) by mouth daily.   Cholecalciferol (VITAMIN D3) 1000 UNITS CAPS Take 1 capsule by mouth daily.   diazepam  (VALIUM ) 5 MG tablet TAKE ONE TABLET BY MOUTH ONCE DAILY AS NEEDED FOR ANXIETY   MULTIPLE VITAMIN PO Take 1 tablet by mouth daily.   vitamin B-12 (CYANOCOBALAMIN ) 1000 MCG tablet Take 1 tablet (1,000 mcg total) by mouth daily.   No facility-administered encounter medications on file as of 11/18/2024.    History: Past Medical History:  Diagnosis Date   Allergic rhinitis, cause unspecified 01/28/2012   Allergy    Anemia    Anxiety 01/28/2012   Arthritis    Cecal cancer (HCC)    Degenerative joint disease 01/28/2012   Depression    History of nonmelanoma skin cancer    Hypertension    Impaired glucose tolerance    Osteoporosis    RT SHOULDER   PONV (postoperative nausea and vomiting)    Past Surgical History:  Procedure Laterality Date   ABDOMINAL HYSTERECTOMY     APPENDECTOMY     BACK SURGERY  2010   MICRODCISCECTOMY   CHOLECYSTECTOMY     LAPAROSCOPIC PARTIAL COLECTOMY N/A 06/20/2014   Procedure: LAPAROSCOPIC ASSISTED RIGHT PARTIAL COLECTOMY;  Surgeon: Bernarda Ned, MD;  Location: [facility];  Service: General;  Laterality: N/A;   TONSILLECTOMY     Family History  Problem Relation Age of Onset   Arthritis Other    Heart disease Other    Stroke Other    Colon cancer Father        unsure age   Social History   Occupational History   Occupation: retired  Tobacco Use   Smoking status: Never   Smokeless tobacco: Never  Vaping Use   Vaping status: Never Used  Substance and Sexual Activity   Alcohol use: No    Alcohol/week: 0.0 standard drinks of alcohol   Drug use: No   Sexual activity: Not  Currently   Tobacco Counseling Counseling given: Not Answered  SDOH Screenings   Food Insecurity: No Food Insecurity (11/18/2024)  Housing: Unknown (11/18/2024)  Transportation Needs: No Transportation Needs (11/18/2024)  Utilities: Not At Risk (11/18/2024)  Alcohol Screen: Low Risk (11/18/2023)  Depression (PHQ2-9): Low Risk (11/18/2024)  Financial Resource Strain: Medium Risk (11/18/2023)  Physical Activity: Inactive (11/18/2024)  Social Connections: Moderately Integrated (11/18/2024)  Stress: No Stress Concern Present (11/18/2024)  Tobacco Use: Low Risk (11/18/2024)  Health Literacy: Adequate Health Literacy (11/18/2024)   See flowsheets for full screening details  Depression Screen PHQ 2 & 9 Depression Scale- Over the past 2 weeks, how often have you been bothered by any of the following problems? Little interest or pleasure in doing things: 0 Feeling down, depressed, or hopeless (PHQ Adolescent also includes...irritable): 0 PHQ-2 Total Score: 0 Trouble falling or staying asleep, or sleeping too much: 0 Feeling tired or having little energy: 0 Poor appetite or overeating (PHQ Adolescent also includes...weight loss): 0 Feeling bad about yourself - or that you are a failure or have let yourself or your family down: 0 Trouble concentrating on things, such as reading the newspaper or watching television (PHQ Adolescent also includes...like school work): 0 Moving or speaking so slowly that other people could have noticed. Or the opposite - being so fidgety or restless that  you have been moving around a lot more than usual: 0 Thoughts that you would be better off dead, or of hurting yourself in some way: 0 PHQ-9 Total Score: 0 If you checked off any problems, how difficult have these problems made it for you to do your work, take care of things at home, or get along with other people?: Not difficult at all  Depression Treatment Depression Interventions/Treatment : EYV7-0 Score <4  Follow-up Not Indicated     Goals Addressed               This Visit's Progress     Patient Stated (pt-stated)        Patient stated she plans to continue staying active - likes to stay busy             Objective:    Today's Vitals   11/18/24 0932  Weight: 123 lb (55.8 kg)  Height: 5' (1.524 m)   Body mass index is 24.02 kg/m.  Hearing/Vision screen Hearing Screening - Comments:: Denies hearing difficulties   Vision Screening - Comments:: Wears rx glasses - up to date with routine eye exams with   Immunizations and Health Maintenance Health Maintenance  Topic Date Due   Zoster Vaccines- Shingrix (1 of 2) Never done   Influenza Vaccine  07/09/2024   OPHTHALMOLOGY EXAM  02/26/2025   FOOT EXAM  06/07/2025   Medicare Annual Wellness (AWV)  11/18/2025   DTaP/Tdap/Td (3 - Td or Tdap) 06/30/2034   Pneumococcal Vaccine: 50+ Years  Completed   Bone Density Scan  Completed   Meningococcal B Vaccine  Aged Out   COVID-19 Vaccine  Discontinued        Assessment/Plan:  This is a routine wellness examination for Carrie Page.  Patient Care Team: Norleen Lynwood ORN, MD as PCP - General (Internal Medicine) Cleatus Collar, MD as Consulting Physician (Ophthalmology)  I have personally reviewed and noted the following in the patients chart:   Medical and social history Use of alcohol, tobacco or illicit drugs  Current medications and supplements including opioid prescriptions. Functional ability and status Nutritional status Physical activity Advanced directives List of other physicians Hospitalizations, surgeries, and ER visits in previous 12 months Vitals Screenings to include cognitive, depression, and falls Referrals and appointments  No orders of the defined types were placed in this encounter.  In addition, I have reviewed and discussed with patient certain preventive protocols, quality metrics, and best practice recommendations. A written personalized care plan for  preventive services as well as general preventive health recommendations were provided to patient.   Carrie Page, CMA   11/18/2024   Return in 1 year (on 11/18/2025).  After Visit Summary: (Declined) Due to this being a telephonic visit, with patients personalized plan was offered to patient but patient Declined AVS at this time   Nurse Notes: Appointment(s) made: (scheduled 2026 AWV appt)

## 2024-12-13 ENCOUNTER — Ambulatory Visit: Admitting: Internal Medicine

## 2024-12-22 ENCOUNTER — Ambulatory Visit: Admitting: Internal Medicine

## 2024-12-22 ENCOUNTER — Encounter: Payer: Self-pay | Admitting: Internal Medicine

## 2024-12-22 ENCOUNTER — Ambulatory Visit: Payer: Self-pay | Admitting: Internal Medicine

## 2024-12-22 ENCOUNTER — Ambulatory Visit (HOSPITAL_COMMUNITY)
Admission: RE | Admit: 2024-12-22 | Discharge: 2024-12-22 | Disposition: A | Discharge status: Home / Self Care | Source: Ambulatory Visit | Attending: Internal Medicine | Admitting: Internal Medicine

## 2024-12-22 VITALS — BP 140/72 | HR 76 | Temp 98.6°F | Ht 60.0 in | Wt 122.0 lb

## 2024-12-22 DIAGNOSIS — I10 Essential (primary) hypertension: Secondary | ICD-10-CM

## 2024-12-22 DIAGNOSIS — M79661 Pain in right lower leg: Secondary | ICD-10-CM | POA: Insufficient documentation

## 2024-12-22 DIAGNOSIS — R7302 Impaired glucose tolerance (oral): Secondary | ICD-10-CM | POA: Diagnosis not present

## 2024-12-22 DIAGNOSIS — M7989 Other specified soft tissue disorders: Secondary | ICD-10-CM | POA: Insufficient documentation

## 2024-12-22 DIAGNOSIS — E785 Hyperlipidemia, unspecified: Secondary | ICD-10-CM

## 2024-12-22 DIAGNOSIS — E538 Deficiency of other specified B group vitamins: Secondary | ICD-10-CM

## 2024-12-22 LAB — BASIC METABOLIC PANEL WITH GFR
BUN: 20 mg/dL (ref 6–23)
CO2: 29 meq/L (ref 19–32)
Calcium: 9.8 mg/dL (ref 8.4–10.5)
Chloride: 99 meq/L (ref 96–112)
Creatinine, Ser: 0.58 mg/dL (ref 0.40–1.20)
GFR: 77.59 mL/min
Glucose, Bld: 119 mg/dL — ABNORMAL HIGH (ref 70–99)
Potassium: 4.5 meq/L (ref 3.5–5.1)
Sodium: 134 meq/L — ABNORMAL LOW (ref 135–145)

## 2024-12-22 LAB — HEPATIC FUNCTION PANEL
ALT: 13 U/L (ref 3–35)
AST: 20 U/L (ref 5–37)
Albumin: 4.3 g/dL (ref 3.5–5.2)
Alkaline Phosphatase: 91 U/L (ref 39–117)
Bilirubin, Direct: 0.1 mg/dL (ref 0.1–0.3)
Total Bilirubin: 0.4 mg/dL (ref 0.2–1.2)
Total Protein: 8 g/dL (ref 6.0–8.3)

## 2024-12-22 LAB — LIPID PANEL
Cholesterol: 193 mg/dL (ref 28–200)
HDL: 64.4 mg/dL
LDL Cholesterol: 102 mg/dL — ABNORMAL HIGH (ref 10–99)
NonHDL: 128.62
Total CHOL/HDL Ratio: 3
Triglycerides: 132 mg/dL (ref 10.0–149.0)
VLDL: 26.4 mg/dL (ref 0.0–40.0)

## 2024-12-22 LAB — HEMOGLOBIN A1C: Hgb A1c MFr Bld: 6.3 % (ref 4.6–6.5)

## 2024-12-22 MED ORDER — AMLODIPINE BESYLATE 5 MG PO TABS: 5.0000 mg | ORAL_TABLET | Freq: Every day | ORAL | 3 refills | Status: AC

## 2024-12-22 NOTE — Patient Instructions (Addendum)
 Please have your Shingrix (shingles) shots done at your local pharmacy.  Please continue all other medications as before, and refills have been done if requested.  Please have the pharmacy call with any other refills you may need.  Please continue your efforts at being more active, low cholesterol diet  Please keep your appointments with your specialists as you may have planned  You will be contacted regarding the referral for: Venous Doppler to check the leg for blood clot, and Sports Medicine  Please go to the LAB at the blood drawing area for the tests to be done  You will be contacted by phone if any changes need to be made immediately.  Otherwise, you will receive a letter about your results with an explanation, but please check with MyChart first.  Please make an Appointment to return in 6 months, or sooner if needed

## 2024-12-22 NOTE — Progress Notes (Signed)
 Patient ID: Carrie Page, female   DOB: 06-Mar-1928, 89 y.o.   MRN: [id number]        Chief Complaint: follow up right knee pain and swelling, preDM, htn, hld       HPI:  Carrie Page is a 89 y.o. female here overall doing ok but has mild to mod pain to right knee and calf with swelling and stiffness x 3 days without trauma, fall, fever.  Voltaren gel has helped. Does not want further pain medication.  Pt denies chest pain, increased sob or doe, wheezing, orthopnea, PND, increased LE swelling, palpitations, dizziness or syncope.   Pt denies polydipsia, polyuria, or new focal neuro s/s.   Does not want statin tx for lipids, or other for now.       Wt Readings from Last 3 Encounters:  12/22/24 122 lb (55.3 kg)  11/18/24 123 lb (55.8 kg)  06/07/24 123 lb (55.8 kg)   BP Readings from Last 3 Encounters:  12/22/24 (!) 140/72  06/30/24 (!) 142/58  06/07/24 132/74         Past Medical History:  Diagnosis Date   Allergic rhinitis, cause unspecified 01/28/2012   Allergy    Anemia    Anxiety 01/28/2012   Arthritis    Cecal cancer (HCC)    Degenerative joint disease 01/28/2012   Depression    History of nonmelanoma skin cancer    Hypertension    Impaired glucose tolerance    Osteoporosis    RT SHOULDER   PONV (postoperative nausea and vomiting)    Past Surgical History:  Procedure Laterality Date   ABDOMINAL HYSTERECTOMY     APPENDECTOMY     BACK SURGERY  2010   MICRODCISCECTOMY   CHOLECYSTECTOMY     LAPAROSCOPIC PARTIAL COLECTOMY N/A 06/20/2014   Procedure: LAPAROSCOPIC ASSISTED RIGHT PARTIAL COLECTOMY;  Surgeon: Bernarda Ned, MD;  Location: [facility];  Service: General;  Laterality: N/A;   TONSILLECTOMY      reports that she has never smoked. She has never used smokeless tobacco. She reports that she does not drink alcohol and does not use drugs. family history includes Arthritis in an other family member; Colon cancer in her father; Heart disease in an other family member;  Stroke in an other family member. Allergies[1] Medications Ordered Prior to Encounter[2]      ROS:  All others reviewed and negative.  Objective        PE:  BP (!) 140/72 (BP Location: Right Arm, Patient Position: Sitting, Cuff Size: Normal)   Pulse 76   Temp 98.6 F (37 C) (Oral)   Ht 5' (1.524 m)   Wt 122 lb (55.3 kg)   SpO2 98%   BMI 23.83 kg/m                 Constitutional: Pt appears in NAD               HENT: Head: NCAT.                Right Ear: External ear normal.                 Left Ear: External ear normal.                Eyes: . Pupils are equal, round, and reactive to light. Conjunctivae and EOM are normal               Nose: without d/c or deformity  Neck: Neck supple. Gross normal ROM               Cardiovascular: Normal rate and regular rhythm.                 Pulmonary/Chest: Effort normal and breath sounds without rales or wheezing.                               Neurological: Pt is alert. At baseline orientation, motor grossly intact               Skin: Skin is warm. No rashes, no other new lesions, LE edema - RLE with right knee effusion 1+ with calf tenderness and 1+ LE sweling distal to the knee without erythema, o/w neurovasc intact motor               Psychiatric: Pt behavior is normal without agitation   Micro: none  Cardiac tracings I have personally interpreted today:  none  Pertinent Radiological findings (summarize): none   Lab Results  Component Value Date   WBC 6.9 06/07/2024   HGB 13.6 06/07/2024   HCT 41.4 06/07/2024   PLT 198.0 06/07/2024   GLUCOSE 119 (H) 12/22/2024   CHOL 193 12/22/2024   TRIG 132.0 12/22/2024   HDL 64.40 12/22/2024   LDLDIRECT 124.0 03/04/2013   LDLCALC 102 (H) 12/22/2024   ALT 13 12/22/2024   AST 20 12/22/2024   NA 134 (L) 12/22/2024   K 4.5 12/22/2024   CL 99 12/22/2024   CREATININE 0.58 12/22/2024   BUN 20 12/22/2024   CO2 29 12/22/2024   TSH 1.85 06/07/2024   HGBA1C 6.3 12/22/2024    Assessment/Plan:  Carrie Page is a 89 y.o. White or Caucasian [1] female with  has a past medical history of Allergic rhinitis, cause unspecified (01/28/2012), Allergy, Anemia, Anxiety (01/28/2012), Arthritis, Cecal cancer (HCC), Degenerative joint disease (01/28/2012), Depression, History of nonmelanoma skin cancer, Hypertension, Impaired glucose tolerance, Osteoporosis, and PONV (postoperative nausea and vomiting).  Pain and swelling of right lower leg Can't r/o dvt - for urgent venous doppler, but also refer sports medicine as primary issue may be knee arthritis flare  Impaired glucose tolerance Lab Results  Component Value Date   HGBA1C 6.3 12/22/2024   Stable, pt to continue current medical treatment  - diet, wt control   Hypertension BP Readings from Last 3 Encounters:  12/22/24 (!) 140/72  06/30/24 (!) 142/58  06/07/24 132/74   Mild uncontrolled, likely reactive it seems, pt to continue medical treatment atacand  32 mg every day, norvasc  5 mg every day, declines other change   Hyperlipidemia Lab Results  Component Value Date   LDLCALC 102 (H) 12/22/2024   Uncontrolled, pt for lower chol diet, declines anti-lipid tx  Followup: Return in about 6 months (around 06/21/2025).  Lynwood Rush, MD 12/24/2024 7:00 AM [facility] [phone]     [1]  Allergies Allergen Reactions   Bactrim  [Sulfamethoxazole -Trimethoprim ] Itching, Rash and Other (See Comments)    Rash, itching, diaphoretic   Lovastatin  Other (See Comments)    Cognitive slowing and myalgias   Nitrofurantoin  Other (See Comments)    GI upset   Penicillins Itching   Pravastatin  Other (See Comments)    Myalgias, increased dreams   Naproxen Rash  [2]  Current Outpatient Medications on File Prior to Visit  Medication Sig Dispense Refill   aspirin   EC 81 MG tablet Take 81 mg by mouth daily.     candesartan  (ATACAND ) 32 MG tablet Take 1 tablet  (32 mg total) by mouth daily. 90 tablet 3   Cholecalciferol (VITAMIN D3) 1000 UNITS CAPS Take 1 capsule by mouth daily.     diazepam  (VALIUM ) 5 MG tablet TAKE ONE TABLET BY MOUTH ONCE DAILY AS NEEDED FOR ANXIETY 30 tablet 2   MULTIPLE VITAMIN PO Take 1 tablet by mouth daily.     vitamin B-12 (CYANOCOBALAMIN ) 1000 MCG tablet Take 1 tablet (1,000 mcg total) by mouth daily. 90 tablet 3   No current facility-administered medications on file prior to visit.

## 2024-12-24 ENCOUNTER — Encounter: Payer: Self-pay | Admitting: Internal Medicine

## 2024-12-24 NOTE — Assessment & Plan Note (Signed)
 Lab Results  Component Value Date   HGBA1C 6.3 12/22/2024   Stable, pt to continue current medical treatment  - diet, wt control

## 2024-12-24 NOTE — Assessment & Plan Note (Addendum)
 Can't r/o dvt - for urgent venous doppler, but also refer sports medicine as primary issue may be knee arthritis flare

## 2024-12-24 NOTE — Assessment & Plan Note (Signed)
 Lab Results  Component Value Date   LDLCALC 102 (H) 12/22/2024   Uncontrolled, pt for lower chol diet, declines anti-lipid tx

## 2024-12-24 NOTE — Assessment & Plan Note (Signed)
 BP Readings from Last 3 Encounters:  12/22/24 (!) 140/72  06/30/24 (!) 142/58  06/07/24 132/74   Mild uncontrolled, likely reactive it seems, pt to continue medical treatment atacand  32 mg every day, norvasc  5 mg every day, declines other change

## 2024-12-28 NOTE — Progress Notes (Unsigned)
 "               Odis Mace D.[facility]MENTEEN [facility] [address] [place] [address] Phone: [phone]   Assessment and Plan:     1. Chronic bilateral low back pain with right-sided sciatica (Primary) 2. Lumbar radiculopathy 3. Right hip pain 4. Chronic pain of right knee 5. Chronic pain of right ankle -Chronic with exacerbation, initial sports medicine visit - Right lower extremity pain, tingling present for years, worsening over the past several weeks.  Most consistent with lumbar radiculopathy.  Differential includes altered gait mechanics from osteoarthritis of right ankle, knee, hip leading to multiple areas of musculoskeletal pain.  Will plan on treating lumbar radiculopathy first, and if no significant improvement, could transition to to using specific degenerative joint disease - Based on patient's symptoms at today's office visit, lumbar MRI 02/13/2019, recommend epidural CSI to right sided L4-L5 - Use Tylenol  500 to 1000 mg tablets 2-3 times a day for day-to-day pain relief -May use tramadol  50 mg daily as needed for severe breakthrough pain    Pertinent previous records reviewed include lumbar MRI 02/13/2019   Follow Up: 2 weeks after epidural to review benefit.  Could consider repeat epidural versus treating specific DJD   Subjective:   I, Carrie Page, am serving as a neurosurgeon for Doctor Morene Mace  Chief Complaint: right leg pain   HPI:   12/29/2024 Patient is a 89 year old female with right leg pain. Patient states pain started about 3 years ago. Pain started on the top of the foot and has radiated up her leg from the thigh and knee. She does not like pain meds. She can't remember the name of med from Dr. Norleen. Voltaren gel helps when she uses it . The pain sometimes makes her feel sick. Does endorse some numbness and tingling. She also has some cramping in her calf. She did have an US  done.   Relevant Historical Information:  Hypertension  Additional pertinent review of systems negative.  Current Medications[1]   Objective:     Vitals:   12/29/24 1415  BP: 138/68  Pulse: 78  SpO2: 98%  Weight: 119 lb (54 kg)  Height: 5' (1.524 m)      Body mass index is 23.24 kg/m.    Physical Exam:    Gen: Appears well, nad, nontoxic and pleasant Psych: Alert and oriented, appropriate mood and affect Neuro: sensation intact, strength is 5/5 in upper and lower extremities, muscle tone wnl Skin: no susupicious lesions or rashes  Back - Normal skin, Spine with normal alignment and no deformity.   No tenderness to vertebral process palpation.   Paraspinous muscles are tender and without spasm NTTP gluteal musculature Straight leg raise + right Gait antalgic favoring left leg Hypertrophic bony ankle, knee  Electronically signed by:  Odis Mace D.[facility] 2:48 PM 12/29/24     [1]  Current Outpatient Medications:    amLODipine  (NORVASC ) 5 MG tablet, Take 1 tablet (5 mg total) by mouth daily., Disp: 90 tablet, Rfl: 3   aspirin  EC 81 MG tablet, Take 81 mg by mouth daily., Disp: , Rfl:    candesartan  (ATACAND ) 32 MG tablet, Take 1 tablet (32 mg total) by mouth daily., Disp: 90 tablet, Rfl: 3   Cholecalciferol (VITAMIN D3) 1000 UNITS CAPS, Take 1 capsule by mouth daily., Disp: , Rfl:    diazepam  (VALIUM ) 5 MG tablet, TAKE ONE TABLET BY MOUTH ONCE DAILY  AS NEEDED FOR ANXIETY, Disp: 30 tablet, Rfl: 2   MULTIPLE VITAMIN PO, Take 1 tablet by mouth daily., Disp: , Rfl:    vitamin B-12 (CYANOCOBALAMIN ) 1000 MCG tablet, Take 1 tablet (1,000 mcg total) by mouth daily., Disp: 90 tablet, Rfl: 3  "

## 2024-12-29 ENCOUNTER — Ambulatory Visit: Admitting: Sports Medicine

## 2024-12-29 VITALS — BP 138/68 | HR 78 | Ht 60.0 in | Wt 119.0 lb

## 2024-12-29 DIAGNOSIS — M5416 Radiculopathy, lumbar region: Secondary | ICD-10-CM | POA: Diagnosis not present

## 2024-12-29 DIAGNOSIS — M25561 Pain in right knee: Secondary | ICD-10-CM

## 2024-12-29 DIAGNOSIS — G8929 Other chronic pain: Secondary | ICD-10-CM | POA: Diagnosis not present

## 2024-12-29 DIAGNOSIS — M25571 Pain in right ankle and joints of right foot: Secondary | ICD-10-CM

## 2024-12-29 DIAGNOSIS — M25551 Pain in right hip: Secondary | ICD-10-CM

## 2024-12-29 DIAGNOSIS — M5441 Lumbago with sciatica, right side: Secondary | ICD-10-CM

## 2024-12-29 NOTE — Patient Instructions (Addendum)
 Epidural right L4-5   Tylenol  [id number] mg 2-3 times a day for pain relief    Call to follow up 2 weeks after to discuss results

## 2025-01-04 ENCOUNTER — Telehealth: Payer: Self-pay | Admitting: Sports Medicine

## 2025-01-04 NOTE — Telephone Encounter (Signed)
 I called the patient back for clarification on questions. Advised on general questions with reason for ordering and went over info that was provided to her on her AVS. Provided the patient with the phone number to [facility] for specific questions regarding the procedure and guidance on insurance auth/cost.

## 2025-01-04 NOTE — Telephone Encounter (Signed)
 Can Dr. Leonce advise on further information regarding general questions about patient being referred to get an epidural.

## 2025-01-04 NOTE — Telephone Encounter (Signed)
 Patient called and would like for someone to call to explain the epidural that she is seeing on her AVS was ordered. She would like for someone to talk her through what happens and how they do it and what exactly it is. She does not remember everything that Dr. Leonce told her in her appt. She said when it comes to her back she can't help but be a little hesitant about it.  She is also curious if her insurance will take care of any portion of it at all.  Please give patient a call when you are able, to help her understand what exactly it is she is going to be called to schedule for.

## 2025-01-19 ENCOUNTER — Other Ambulatory Visit

## 2025-05-19 ENCOUNTER — Ambulatory Visit: Admitting: Nurse Practitioner

## 2025-11-22 ENCOUNTER — Ambulatory Visit
